# Patient Record
Sex: Female | Born: 1985 | Hispanic: Yes | Marital: Single | Smoking: Never smoker
Health system: Southern US, Community
[De-identification: ages and names within clinical notes are randomized; demographics above are authoritative.]

## PROBLEM LIST (undated history)

## (undated) DIAGNOSIS — Z789 Other specified health status: Secondary | ICD-10-CM

## (undated) DIAGNOSIS — O24419 Gestational diabetes mellitus in pregnancy, unspecified control: Secondary | ICD-10-CM

## (undated) DIAGNOSIS — Z87898 Personal history of other specified conditions: Secondary | ICD-10-CM

## (undated) HISTORY — DX: Gestational diabetes mellitus in pregnancy, unspecified control: O24.419

## (undated) HISTORY — PX: NO PAST SURGERIES: SHX2092

## (undated) HISTORY — DX: Personal history of other specified conditions: Z87.898

---

## 2008-02-20 ENCOUNTER — Inpatient Hospital Stay (HOSPITAL_COMMUNITY): Admission: AD | Admit: 2008-02-20 | Discharge: 2008-02-23 | Payer: Self-pay | Admitting: Obstetrics

## 2010-01-09 NOTE — L&D Delivery Note (Signed)
Delivery Note At 6:23 AM a viable female was delivered via Vaginal, Spontaneous Delivery (Presentation: ; Occiput Anterior).  APGAR: 9, 9; weight 8 lb 5.2 oz (3776 g).   Placenta status: Intact, Manual removal.  Cord: 3 vessels with the following complications: None.  Cord pH: not done  Anesthesia: Epidural  Episiotomy: None Lacerations: None Suture Repair: 2.0 Est. Blood Loss (mL):   Mom to postpartum.  Baby to nursery-stable.  Melodi Happel A 09/24/2010, 6:42 AM

## 2010-04-26 LAB — CBC
HCT: 27.6 % — ABNORMAL LOW (ref 36.0–46.0)
Hemoglobin: 9.6 g/dL — ABNORMAL LOW (ref 12.0–15.0)
Platelets: 220 10*3/uL (ref 150–400)
RBC: 3.1 MIL/uL — ABNORMAL LOW (ref 3.87–5.11)
RBC: 3.91 MIL/uL (ref 3.87–5.11)
RDW: 15 % (ref 11.5–15.5)
WBC: 10.2 10*3/uL (ref 4.0–10.5)
WBC: 8 10*3/uL (ref 4.0–10.5)

## 2010-04-26 LAB — RPR: RPR Ser Ql: NONREACTIVE

## 2010-05-27 NOTE — Discharge Summary (Signed)
NAME:  Laura Brady, Laura Brady NO.:  [id number]   MEDICAL RECORD NO.:  [id number]          PATIENT TYPE:  INP   LOCATION:  9128                          FACILITY:  [facility]   PHYSICIAN:  Charles A. Clearance Coots, M.D.DATE OF BIRTH:  08-15-1985   DATE OF ADMISSION:  02/20/2008  DATE OF DISCHARGE:  02/23/2008                               DISCHARGE SUMMARY   ADMITTING DIAGNOSIS:  Postdates pregnancy, induction of labor.   DISCHARGE DIAGNOSES:  Postdates pregnancy, induction of labor status  post normal spontaneous vaginal delivery.  On February 21, 2008, viable  female was delivered at 0530, Apgars of 8 at one minute and 9 at five  minutes, weight of 6 pounds and 12 ounces.  Mother and infant discharged  home in good condition.   REASON FOR ADMISSION:  A 25 year old G1, estimated date of confinement,  February 16, 2008, presents for induction of labor for postdate.   PAST MEDICAL HISTORY:  Surgery, none.  Illnesses, none.   MEDICATIONS:  Prenatal vitamins.   ALLERGIES:  No known drug allergies.   SOCIAL HISTORY:  Single.  Negative tobacco, alcohol, or recreational  drug use.   FAMILY HISTORY:  Remarkable for diabetes.   PHYSICAL EXAMINATION:  GENERAL:  Well-nourished, well-developed female  in no acute distress, afebrile.  VITAL SIGNS:  Stable.  Lungs:  Clear to auscultation bilaterally.  HEART:  Regular rate and rhythm.  ABDOMEN:  Gravid, nontender.  Cervix 3 cm dilated, 80% effaced, vertex  at -2 station.   ADMITTING LABORATORY:  Hemoglobin 11.7, hematocrit 34.9, white blood  cell count 10,200, platelets 220,000.  RPR was nonreactive.   HOSPITAL COURSE:  The patient was admitted and started on Pitocin,  induction of labor and responded well to Pitocin and progressed to  normal spontaneous vaginal delivery on February 21, 2008.  There were no  intrapartum complications.  Postpartum course was uncomplicated and the  patient was discharged home on postpartum day #2 in good  condition.   DISCHARGE LABORATORY:  Hemoglobin 9.6, hematocrit 27.6, white blood cell  count 8000, platelets 170,000.   DISCHARGE DISPOSITION:  Continue prenatal vitamins.  Ibuprofen was  prescribed for pain.  Routine written instructions were given for  discharge after vaginal delivery.  The patient is to call office for  followup appointment in 6 weeks.      Charles A. Clearance Coots, M.D.  Electronically Signed     CAH/MEDQ  D:  03/13/2008  T:  03/14/2008  Job:  [id number]

## 2010-09-24 ENCOUNTER — Encounter (HOSPITAL_COMMUNITY): Payer: Self-pay | Admitting: Anesthesiology

## 2010-09-24 ENCOUNTER — Inpatient Hospital Stay (HOSPITAL_COMMUNITY)
Admission: AD | Admit: 2010-09-24 | Discharge: 2010-09-26 | DRG: 774 | Disposition: A | Discharge status: Home / Self Care | Payer: Medicaid Other | Source: Ambulatory Visit | Attending: Obstetrics | Admitting: Obstetrics

## 2010-09-24 ENCOUNTER — Encounter (HOSPITAL_COMMUNITY): Payer: Self-pay | Admitting: *Deleted

## 2010-09-24 ENCOUNTER — Inpatient Hospital Stay (HOSPITAL_COMMUNITY): Payer: Medicaid Other | Admitting: Anesthesiology

## 2010-09-24 LAB — CBC
HCT: 35.1 % — ABNORMAL LOW (ref 36.0–46.0)
MCH: 28.7 pg (ref 26.0–34.0)
MCHC: 33.3 g/dL (ref 30.0–36.0)
MCV: 86.2 fL (ref 78.0–100.0)
Platelets: 203 10*3/uL (ref 150–400)
RDW: 14.1 % (ref 11.5–15.5)

## 2010-09-24 LAB — ABO/RH: RH Type: POSITIVE

## 2010-09-24 LAB — RPR: RPR: NONREACTIVE

## 2010-09-24 LAB — HIV ANTIBODY (ROUTINE TESTING W REFLEX): HIV: NONREACTIVE

## 2010-09-24 LAB — HEPATITIS B SURFACE ANTIGEN: Hepatitis B Surface Ag: NEGATIVE

## 2010-09-24 LAB — RUBELLA ANTIBODY, IGM: Rubella: IMMUNE

## 2010-09-24 MED ORDER — BENZOCAINE-MENTHOL 20-0.5 % EX AERO
INHALATION_SPRAY | CUTANEOUS | Status: AC
  Filled 2010-09-24: qty 56

## 2010-09-24 MED ORDER — CITRIC ACID-SODIUM CITRATE 334-500 MG/5ML PO SOLN: 30.0000 mL | ORAL | Status: DC | PRN

## 2010-09-24 MED ORDER — ACETAMINOPHEN 325 MG PO TABS: 650.0000 mg | ORAL_TABLET | ORAL | Status: DC | PRN

## 2010-09-24 MED ORDER — OXYCODONE-ACETAMINOPHEN 5-325 MG PO TABS: 2.0000 | ORAL_TABLET | ORAL | Status: DC | PRN

## 2010-09-24 MED ORDER — LACTATED RINGERS IV SOLN
INTRAVENOUS | Status: DC
  Administered 2010-09-24 (×2): 125 mL/h via INTRAVENOUS

## 2010-09-24 MED ORDER — ZOLPIDEM TARTRATE 5 MG PO TABS: 5.0000 mg | ORAL_TABLET | Freq: Every evening | ORAL | Status: DC | PRN

## 2010-09-24 MED ORDER — PRENATAL PLUS 27-1 MG PO TABS
1.0000 | ORAL_TABLET | Freq: Every day | ORAL | Status: DC
  Administered 2010-09-24 – 2010-09-26 (×3): 1 via ORAL
  Filled 2010-09-24 (×3): qty 1

## 2010-09-24 MED ORDER — PENICILLIN G POTASSIUM 5000000 UNITS IJ SOLR
2.5000 10*6.[IU] | INTRAVENOUS | Status: DC
  Filled 2010-09-24 (×3): qty 2.5

## 2010-09-24 MED ORDER — FERROUS SULFATE 325 (65 FE) MG PO TABS
325.0000 mg | ORAL_TABLET | Freq: Two times a day (BID) | ORAL | Status: DC
  Administered 2010-09-24 – 2010-09-26 (×4): 325 mg via ORAL
  Filled 2010-09-24 (×4): qty 1

## 2010-09-24 MED ORDER — ONDANSETRON HCL 4 MG/2ML IJ SOLN: 4.0000 mg | INTRAMUSCULAR | Status: DC | PRN

## 2010-09-24 MED ORDER — FENTANYL 2.5 MCG/ML BUPIVACAINE 1/10 % EPIDURAL INFUSION (WH - ANES)
14.0000 mL/h | INTRAMUSCULAR | Status: DC
  Filled 2010-09-24: qty 60

## 2010-09-24 MED ORDER — ONDANSETRON HCL 4 MG PO TABS: 4.0000 mg | ORAL_TABLET | ORAL | Status: DC | PRN

## 2010-09-24 MED ORDER — PHENYLEPHRINE 40 MCG/ML (10ML) SYRINGE FOR IV PUSH (FOR BLOOD PRESSURE SUPPORT)
80.0000 ug | PREFILLED_SYRINGE | INTRAVENOUS | Status: DC | PRN
  Filled 2010-09-24: qty 5

## 2010-09-24 MED ORDER — LIDOCAINE HCL 1.5 % IJ SOLN
INTRAMUSCULAR | Status: DC | PRN
  Administered 2010-09-24 (×2): 4 mL via EPIDURAL

## 2010-09-24 MED ORDER — LACTATED RINGERS IV SOLN
500.0000 mL | Freq: Once | INTRAVENOUS | Status: AC
  Administered 2010-09-24: 600 mL via INTRAVENOUS

## 2010-09-24 MED ORDER — ONDANSETRON HCL 4 MG/2ML IJ SOLN: 4.0000 mg | Freq: Four times a day (QID) | INTRAMUSCULAR | Status: DC | PRN

## 2010-09-24 MED ORDER — OXYTOCIN BOLUS FROM INFUSION
500.0000 mL | Freq: Once | INTRAVENOUS | Status: DC
  Filled 2010-09-24: qty 1000
  Filled 2010-09-24: qty 500

## 2010-09-24 MED ORDER — EPHEDRINE 5 MG/ML INJ
10.0000 mg | INTRAVENOUS | Status: DC | PRN
  Filled 2010-09-24 (×2): qty 4

## 2010-09-24 MED ORDER — SIMETHICONE 80 MG PO CHEW: 80.0000 mg | CHEWABLE_TABLET | ORAL | Status: DC | PRN

## 2010-09-24 MED ORDER — IBUPROFEN 600 MG PO TABS
600.0000 mg | ORAL_TABLET | Freq: Four times a day (QID) | ORAL | Status: DC | PRN
  Filled 2010-09-24 (×7): qty 1

## 2010-09-24 MED ORDER — BENZOCAINE-MENTHOL 20-0.5 % EX AERO
INHALATION_SPRAY | CUTANEOUS | Status: AC
  Administered 2010-09-24: 18:00:00
  Filled 2010-09-24: qty 56

## 2010-09-24 MED ORDER — LIDOCAINE HCL (PF) 1 % IJ SOLN
30.0000 mL | INTRAMUSCULAR | Status: DC | PRN
  Filled 2010-09-24 (×2): qty 30

## 2010-09-24 MED ORDER — BUTORPHANOL TARTRATE 2 MG/ML IJ SOLN: 2.0000 mg | INTRAMUSCULAR | Status: DC | PRN

## 2010-09-24 MED ORDER — PHENYLEPHRINE 40 MCG/ML (10ML) SYRINGE FOR IV PUSH (FOR BLOOD PRESSURE SUPPORT)
80.0000 ug | PREFILLED_SYRINGE | INTRAVENOUS | Status: DC | PRN
  Filled 2010-09-24 (×2): qty 5

## 2010-09-24 MED ORDER — LACTATED RINGERS IV SOLN
500.0000 mL | INTRAVENOUS | Status: DC | PRN
Start: 2010-09-24 — End: 2010-09-24

## 2010-09-24 MED ORDER — OXYTOCIN 20 UNITS IN LACTATED RINGERS INFUSION - SIMPLE: 1.0000 m[IU]/min | INTRAVENOUS | Status: DC

## 2010-09-24 MED ORDER — EPHEDRINE 5 MG/ML INJ
10.0000 mg | INTRAVENOUS | Status: DC | PRN
  Filled 2010-09-24: qty 4

## 2010-09-24 MED ORDER — DIPHENHYDRAMINE HCL 50 MG/ML IJ SOLN: 12.5000 mg | INTRAMUSCULAR | Status: DC | PRN

## 2010-09-24 MED ORDER — PROMETHAZINE HCL 25 MG/ML IJ SOLN: 12.5000 mg | Freq: Four times a day (QID) | INTRAMUSCULAR | Status: DC | PRN

## 2010-09-24 MED ORDER — WITCH HAZEL-GLYCERIN EX PADS: 1.0000 "application " | MEDICATED_PAD | CUTANEOUS | Status: DC | PRN

## 2010-09-24 MED ORDER — SENNOSIDES-DOCUSATE SODIUM 8.6-50 MG PO TABS
2.0000 | ORAL_TABLET | Freq: Every day | ORAL | Status: DC
  Administered 2010-09-24: 2 via ORAL

## 2010-09-24 MED ORDER — LANOLIN HYDROUS EX OINT: TOPICAL_OINTMENT | CUTANEOUS | Status: DC | PRN

## 2010-09-24 MED ORDER — PENICILLIN G POTASSIUM 5000000 UNITS IJ SOLR
5.0000 10*6.[IU] | Freq: Once | INTRAVENOUS | Status: AC
  Administered 2010-09-24: 5 10*6.[IU] via INTRAVENOUS
  Filled 2010-09-24: qty 5

## 2010-09-24 MED ORDER — TERBUTALINE SULFATE 1 MG/ML IJ SOLN: 0.2500 mg | Freq: Once | INTRAMUSCULAR | Status: DC | PRN

## 2010-09-24 MED ORDER — TETANUS-DIPHTH-ACELL PERTUSSIS 5-2.5-18.5 LF-MCG/0.5 IM SUSP
0.5000 mL | Freq: Once | INTRAMUSCULAR | Status: AC
  Administered 2010-09-26: 0.5 mL via INTRAMUSCULAR
  Filled 2010-09-24: qty 0.5

## 2010-09-24 MED ORDER — DIPHENHYDRAMINE HCL 25 MG PO CAPS: 25.0000 mg | ORAL_CAPSULE | Freq: Four times a day (QID) | ORAL | Status: DC | PRN

## 2010-09-24 MED ORDER — IBUPROFEN 600 MG PO TABS
600.0000 mg | ORAL_TABLET | Freq: Four times a day (QID) | ORAL | Status: DC
  Administered 2010-09-24 – 2010-09-26 (×8): 600 mg via ORAL
  Filled 2010-09-24: qty 1

## 2010-09-24 MED ORDER — DIBUCAINE 1 % RE OINT
1.0000 | TOPICAL_OINTMENT | RECTAL | Status: DC | PRN
Start: 2010-09-24 — End: 2010-09-26

## 2010-09-24 MED ORDER — FLEET ENEMA 7-19 GM/118ML RE ENEM: 1.0000 | ENEMA | RECTAL | Status: DC | PRN

## 2010-09-24 MED ORDER — FENTANYL 2.5 MCG/ML BUPIVACAINE 1/10 % EPIDURAL INFUSION (WH - ANES)
INTRAMUSCULAR | Status: DC | PRN
  Administered 2010-09-24: 14 mL/h via EPIDURAL

## 2010-09-24 MED ORDER — BENZOCAINE-MENTHOL 20-0.5 % EX AERO: 1.0000 "application " | INHALATION_SPRAY | CUTANEOUS | Status: DC | PRN

## 2010-09-24 MED ORDER — OXYTOCIN 20 UNITS IN LACTATED RINGERS INFUSION - SIMPLE
125.0000 mL/h | Freq: Once | INTRAVENOUS | Status: AC
  Administered 2010-09-24: 125 mL/h via INTRAVENOUS

## 2010-09-24 MED ORDER — OXYCODONE-ACETAMINOPHEN 5-325 MG PO TABS: 1.0000 | ORAL_TABLET | ORAL | Status: DC | PRN

## 2010-09-24 NOTE — H&P (Signed)
This is Dr. Francoise Ceo dictating the history and physical on Laura Brady She's a 25 year old gravida 2 para 1001 EDC 10-12 Her membranes ruptured artificially correction spontaneously at 12:30 AM On admission she was 4 cm she is now fully dilated and plus one station Her GBS was unknown and she was treated with penicillin Past medical history negative Past surgical history negative  social history negative System review negative Physical exam HEENT negative Breasts negative Heart regular with no murmurs no gallops Abdomen term Pelvic as described above Extremities negative and

## 2010-09-24 NOTE — Progress Notes (Signed)
Pt states, " I have had contractions off and on all day, and at 0015 my water broke. "

## 2010-09-24 NOTE — Anesthesia Procedure Notes (Signed)
Epidural Patient location during procedure: OB Start time: 09/24/2010 4:05 AM  Staffing Anesthesiologist: Malen Gauze, Jaser Fullen A. Performed by: anesthesiologist   Preanesthetic Checklist Completed: patient identified, site marked, surgical consent, pre-op evaluation, timeout performed, IV checked, risks and benefits discussed and monitors and equipment checked  Epidural Patient position: sitting Prep: site prepped and draped and DuraPrep Patient monitoring: continuous pulse ox and blood pressure Approach: midline Injection technique: LOR air  Needle:  Needle type: Tuohy  Needle gauge: 17 G Needle length: 9 cm Needle insertion depth: 5 cm cm Catheter type: closed end flexible Catheter size: 19 Gauge Catheter at skin depth: 10 cm Test dose: negative and 1.5% lidocaine  Assessment Events: blood not aspirated, injection not painful, no injection resistance, negative IV test and no paresthesia  Additional Notes Patient is more comfortable after epidural dosed. Please see RN's note for documentation of vital signs and FHR which are stable.

## 2010-09-24 NOTE — Anesthesia Postprocedure Evaluation (Signed)
  Anesthesia Post-op Note  Patient: Laura Brady  Procedure(s) Performed: * No procedures listed *  Patient Location: PACU and Mother/Baby  Anesthesia Type: Epidural  Level of Consciousness: awake, alert  and oriented  Airway and Oxygen Therapy: Patient Spontanous Breathing   Post-op Assessment: Patient's Cardiovascular Status Stable and Respiratory Function Stable  Post-op Vital Signs: stable  Complications: No apparent anesthesia complications

## 2010-09-24 NOTE — Anesthesia Preprocedure Evaluation (Signed)
Anesthesia Evaluation  Name, MR# and DOB Patient awake  General Assessment Comment  Reviewed: Allergy & Precautions, H&P , Patient's Chart, lab work & pertinent test results  Airway Mallampati: III TM Distance: >3 FB Neck ROM: full    Dental No notable dental hx. (+) Teeth Intact   Pulmonary  clear to auscultation  pulmonary exam normalPulmonary Exam Normal breath sounds clear to auscultation none    Cardiovascular regular Normal    Neuro/Psych Negative Neurological ROS  Negative Psych ROS  GI/Hepatic/Renal negative GI ROS  negative Liver ROS  negative Renal ROS        Endo/Other  Negative Endocrine ROS (+)      Abdominal   Musculoskeletal   Hematology negative hematology ROS (+)   Peds  Reproductive/Obstetrics (+) Pregnancy    Anesthesia Other Findings             Anesthesia Physical Anesthesia Plan  ASA: II  Anesthesia Plan: Epidural   Post-op Pain Management:    Induction:   Airway Management Planned:   Additional Equipment:   Intra-op Plan:   Post-operative Plan:   Informed Consent: I have reviewed the patients History and Physical, chart, labs and discussed the procedure including the risks, benefits and alternatives for the proposed anesthesia with the patient or authorized representative who has indicated his/her understanding and acceptance.     Plan Discussed with: Anesthesiologist  Anesthesia Plan Comments:         Anesthesia Quick Evaluation  

## 2010-09-24 NOTE — Plan of Care (Signed)
Problem: Consults Goal: Birthing Suites Patient Information Press F2 to bring up selections list   Pt 37-[redacted] weeks EGA, SROM at home.

## 2010-09-25 LAB — CBC
MCH: 28.7 pg (ref 26.0–34.0)
MCHC: 33.1 g/dL (ref 30.0–36.0)
Platelets: 162 10*3/uL (ref 150–400)

## 2010-09-25 NOTE — Progress Notes (Signed)
Postpartum day #1 Vital signs normal Fundus firm Lochia moderate Legs negative No complaints   Postpartum day #1 Vital signs normal Fundus firm Lochia moderate Legs negative No complaints

## 2010-09-26 MED ORDER — NORETHINDRONE 0.35 MG PO TABS: 1.0000 | ORAL_TABLET | Freq: Every day | ORAL | Status: DC

## 2010-09-26 MED ORDER — OXYCODONE-ACETAMINOPHEN 5-325 MG PO TABS: 2.0000 | ORAL_TABLET | Freq: Four times a day (QID) | ORAL | Status: AC | PRN

## 2010-09-26 NOTE — Progress Notes (Signed)
UR chart review completed.  

## 2010-09-26 NOTE — Discharge Summary (Signed)
  Obstetric Discharge Summary Reason for Admission: onset of labor Prenatal Procedures: none Intrapartum Procedures: spontaneous vaginal delivery Postpartum Procedures: none Complications-Operative and Postpartum: none  Hemoglobin  Date Value Range Status  09/25/2010 10.1* 12.0-15.0 (g/dL) Final     HCT  Date Value Range Status  09/25/2010 30.5* 36.0-46.0 (%) Final    Discharge Diagnoses: Term Pregnancy-delivered  Discharge Information: Date: 09/26/2010 Activity: pelvic rest Diet: routine Medications: Ibuprophen Condition: stable Instructions: refer to routine discharge instructions Discharge to: home Follow-up Information    Follow up with Antionette Char A, MD. Call in 6 weeks.   Contact information:   [address] [place] [address] [phone]          Newborn Data: Live born  Information for the patient's newborn:  Mistie, Adney [[id number]]  female ; APGAR , ; weight ;  Home with mother.  JACKSON-MOORE,Iain Sawchuk A 09/26/2010, 8:14 AM

## 2010-09-26 NOTE — Progress Notes (Signed)
Post Partum Day #2 S/P:spontaneous vaginal  RH status/Rubella reviewed.  Feeding: breast Subjective: No HA, SOB, CP, F/C, breast symptoms: No. Normal vaginal bleeding, no clots.     Objective:  Blood pressure 93/59, pulse 64, temperature 98.1 F (36.7 C), temperature source Oral, resp. rate 16, height 5\' 3"  (1.6 m), weight 82.555 kg (182 lb), last menstrual period 01/04/2010, SpO2 99.00%, unknown if currently breastfeeding. I  Physical Exam:  General: alert Lochia: appropriate Uterine Fundus: firm DVT Evaluation: No evidence of DVT seen on physical exam. Ext: No c/c/e  Basename 09/25/10 0515 09/24/10 0110  HGB 10.1* 11.7*  HCT 30.5* 35.1*    Assessment/Plan: 25 y.o.  PPD # 2 .  normal postpartum exam patient is a candidate for oral progesterone-only contraceptive for contraception, with no contraindications Continue current postpartum care D/C home   LOS: 2 days   JACKSON-MOORE,Betsaida [place] A 09/26/2010, 8:03 AM

## 2013-01-09 LAB — OB RESULTS CONSOLE RPR: RPR: NONREACTIVE

## 2013-01-09 LAB — OB RESULTS CONSOLE RUBELLA ANTIBODY, IGM: RUBELLA: IMMUNE

## 2013-01-09 LAB — OB RESULTS CONSOLE GC/CHLAMYDIA
Chlamydia: NEGATIVE
GC PROBE AMP, GENITAL: NEGATIVE

## 2013-01-09 LAB — OB RESULTS CONSOLE ANTIBODY SCREEN: Antibody Screen: NEGATIVE

## 2013-01-09 LAB — OB RESULTS CONSOLE HIV ANTIBODY (ROUTINE TESTING): HIV: NONREACTIVE

## 2013-01-09 LAB — OB RESULTS CONSOLE ABO/RH: RH TYPE: POSITIVE

## 2013-01-09 LAB — OB RESULTS CONSOLE HEPATITIS B SURFACE ANTIGEN: Hepatitis B Surface Ag: NEGATIVE

## 2013-01-09 NOTE — L&D Delivery Note (Signed)
Delivery Note At 4:48 PM a viable female was delivered via  (Presentation:oa,with nuchal cord and occult arm which was reduced without  difficulty ;  ).  APGAR:9 ,9 ; weight .   Placenta status:spont , . 3vc  Cord:  with the following complications:none .  Cord pH: n/a  Anesthesia:  none Episiotomy: none Lacerations: none Suture Repair: n/a Est. Blood Loss (mL):   Mom to postpartum.  Baby to Couplet care / Skin to Skin.  Laura Brady, Laura Brady 08/24/2013, 4:57 PM

## 2013-02-24 ENCOUNTER — Other Ambulatory Visit (HOSPITAL_COMMUNITY): Payer: Self-pay | Admitting: Physician Assistant

## 2013-02-24 DIAGNOSIS — Z3689 Encounter for other specified antenatal screening: Secondary | ICD-10-CM

## 2013-02-24 LAB — OB RESULTS CONSOLE ABO/RH: RH Type: POSITIVE

## 2013-02-24 LAB — OB RESULTS CONSOLE RPR: RPR: NONREACTIVE

## 2013-02-24 LAB — OB RESULTS CONSOLE RUBELLA ANTIBODY, IGM: RUBELLA: IMMUNE

## 2013-02-24 LAB — OB RESULTS CONSOLE HIV ANTIBODY (ROUTINE TESTING): HIV: NONREACTIVE

## 2013-02-24 LAB — OB RESULTS CONSOLE HEPATITIS B SURFACE ANTIGEN: Hepatitis B Surface Ag: NEGATIVE

## 2013-02-24 LAB — OB RESULTS CONSOLE ANTIBODY SCREEN: Antibody Screen: NEGATIVE

## 2013-02-24 LAB — OB RESULTS CONSOLE GC/CHLAMYDIA
CHLAMYDIA, DNA PROBE: NEGATIVE
Gonorrhea: NEGATIVE

## 2013-04-14 ENCOUNTER — Ambulatory Visit (HOSPITAL_COMMUNITY)
Admission: RE | Admit: 2013-04-14 | Discharge: 2013-04-14 | Disposition: A | Discharge status: Home / Self Care | Payer: Medicaid Other | Source: Ambulatory Visit | Attending: Physician Assistant | Admitting: Physician Assistant

## 2013-04-14 DIAGNOSIS — Z3689 Encounter for other specified antenatal screening: Secondary | ICD-10-CM | POA: Insufficient documentation

## 2013-06-09 LAB — OB RESULTS CONSOLE GBS: GBS: NEGATIVE

## 2013-07-04 ENCOUNTER — Other Ambulatory Visit: Payer: Self-pay | Admitting: Family Medicine

## 2013-07-04 DIAGNOSIS — N63 Unspecified lump in unspecified breast: Secondary | ICD-10-CM

## 2013-07-09 ENCOUNTER — Other Ambulatory Visit: Payer: Medicaid Other

## 2013-07-10 ENCOUNTER — Ambulatory Visit
Admission: RE | Admit: 2013-07-10 | Discharge: 2013-07-10 | Disposition: A | Discharge status: Home / Self Care | Payer: Medicaid Other | Source: Ambulatory Visit | Attending: Family Medicine | Admitting: Family Medicine

## 2013-07-10 DIAGNOSIS — N63 Unspecified lump in unspecified breast: Secondary | ICD-10-CM

## 2013-08-07 LAB — OB RESULTS CONSOLE GBS: STREP GROUP B AG: NEGATIVE

## 2013-08-24 ENCOUNTER — Encounter (HOSPITAL_COMMUNITY): Payer: Self-pay | Admitting: *Deleted

## 2013-08-24 ENCOUNTER — Inpatient Hospital Stay (HOSPITAL_COMMUNITY)
Admission: AD | Admit: 2013-08-24 | Discharge: 2013-08-26 | DRG: 775 | Disposition: A | Discharge status: Home / Self Care | Payer: Medicaid Other | Source: Ambulatory Visit | Attending: Obstetrics & Gynecology | Admitting: Obstetrics & Gynecology

## 2013-08-24 DIAGNOSIS — O479 False labor, unspecified: Secondary | ICD-10-CM | POA: Diagnosis present

## 2013-08-24 LAB — TYPE AND SCREEN
ABO/RH(D): A POS
Antibody Screen: NEGATIVE

## 2013-08-24 LAB — CBC
HEMATOCRIT: 37.3 % (ref 36.0–46.0)
HEMOGLOBIN: 12.4 g/dL (ref 12.0–15.0)
MCH: 29.8 pg (ref 26.0–34.0)
MCHC: 33.2 g/dL (ref 30.0–36.0)
MCV: 89.7 fL (ref 78.0–100.0)
Platelets: 158 10*3/uL (ref 150–400)
RBC: 4.16 MIL/uL (ref 3.87–5.11)
RDW: 14.9 % (ref 11.5–15.5)
WBC: 8.3 10*3/uL (ref 4.0–10.5)

## 2013-08-24 LAB — ABO/RH: ABO/RH(D): A POS

## 2013-08-24 MED ORDER — DIPHENHYDRAMINE HCL 50 MG/ML IJ SOLN: 12.5000 mg | INTRAMUSCULAR | Status: DC | PRN

## 2013-08-24 MED ORDER — LIDOCAINE HCL (PF) 1 % IJ SOLN
INTRAMUSCULAR | Status: AC
  Filled 2013-08-24: qty 30

## 2013-08-24 MED ORDER — PRENATAL MULTIVITAMIN CH
1.0000 | ORAL_TABLET | Freq: Every day | ORAL | Status: DC
  Administered 2013-08-25 – 2013-08-26 (×2): 1 via ORAL
  Filled 2013-08-24 (×2): qty 1

## 2013-08-24 MED ORDER — LACTATED RINGERS IV SOLN: 500.0000 mL | Freq: Once | INTRAVENOUS | Status: DC

## 2013-08-24 MED ORDER — WITCH HAZEL-GLYCERIN EX PADS: 1.0000 "application " | MEDICATED_PAD | CUTANEOUS | Status: DC | PRN

## 2013-08-24 MED ORDER — DIPHENHYDRAMINE HCL 25 MG PO CAPS
25.0000 mg | ORAL_CAPSULE | Freq: Four times a day (QID) | ORAL | Status: DC | PRN
Start: 2013-08-24 — End: 2013-08-26

## 2013-08-24 MED ORDER — ONDANSETRON HCL 4 MG PO TABS: 4.0000 mg | ORAL_TABLET | ORAL | Status: DC | PRN

## 2013-08-24 MED ORDER — EPHEDRINE 5 MG/ML INJ: 10.0000 mg | INTRAVENOUS | Status: DC | PRN

## 2013-08-24 MED ORDER — OXYTOCIN 40 UNITS IN LACTATED RINGERS INFUSION - SIMPLE MED
INTRAVENOUS | Status: AC
  Administered 2013-08-24: 40 [IU]
  Filled 2013-08-24: qty 1000

## 2013-08-24 MED ORDER — ONDANSETRON HCL 4 MG/2ML IJ SOLN: 4.0000 mg | INTRAMUSCULAR | Status: DC | PRN

## 2013-08-24 MED ORDER — TETANUS-DIPHTH-ACELL PERTUSSIS 5-2.5-18.5 LF-MCG/0.5 IM SUSP: 0.5000 mL | Freq: Once | INTRAMUSCULAR | Status: DC

## 2013-08-24 MED ORDER — SIMETHICONE 80 MG PO CHEW: 80.0000 mg | CHEWABLE_TABLET | ORAL | Status: DC | PRN

## 2013-08-24 MED ORDER — OXYTOCIN 40 UNITS IN LACTATED RINGERS INFUSION - SIMPLE MED: 62.5000 mL/h | INTRAVENOUS | Status: DC | PRN

## 2013-08-24 MED ORDER — ZOLPIDEM TARTRATE 5 MG PO TABS: 5.0000 mg | ORAL_TABLET | Freq: Every evening | ORAL | Status: DC | PRN

## 2013-08-24 MED ORDER — SENNOSIDES-DOCUSATE SODIUM 8.6-50 MG PO TABS
2.0000 | ORAL_TABLET | ORAL | Status: DC
  Administered 2013-08-25 – 2013-08-26 (×2): 2 via ORAL
  Filled 2013-08-24: qty 1
  Filled 2013-08-24 (×2): qty 2

## 2013-08-24 MED ORDER — SODIUM CHLORIDE 0.9 % IJ SOLN: 3.0000 mL | INTRAMUSCULAR | Status: DC | PRN

## 2013-08-24 MED ORDER — PHENYLEPHRINE 40 MCG/ML (10ML) SYRINGE FOR IV PUSH (FOR BLOOD PRESSURE SUPPORT): 80.0000 ug | PREFILLED_SYRINGE | INTRAVENOUS | Status: DC | PRN

## 2013-08-24 MED ORDER — SODIUM CHLORIDE 0.9 % IV SOLN: 250.0000 mL | INTRAVENOUS | Status: DC | PRN

## 2013-08-24 MED ORDER — BENZOCAINE-MENTHOL 20-0.5 % EX AERO
1.0000 "application " | INHALATION_SPRAY | CUTANEOUS | Status: DC | PRN
  Administered 2013-08-24: 1 via TOPICAL
  Filled 2013-08-24 (×2): qty 56

## 2013-08-24 MED ORDER — FENTANYL 2.5 MCG/ML BUPIVACAINE 1/10 % EPIDURAL INFUSION (WH - ANES): 14.0000 mL/h | INTRAMUSCULAR | Status: DC | PRN

## 2013-08-24 MED ORDER — LANOLIN HYDROUS EX OINT: TOPICAL_OINTMENT | CUTANEOUS | Status: DC | PRN

## 2013-08-24 MED ORDER — OXYCODONE-ACETAMINOPHEN 5-325 MG PO TABS
1.0000 | ORAL_TABLET | ORAL | Status: DC | PRN
  Administered 2013-08-25: 1 via ORAL
  Filled 2013-08-24: qty 1

## 2013-08-24 MED ORDER — IBUPROFEN 600 MG PO TABS
600.0000 mg | ORAL_TABLET | Freq: Four times a day (QID) | ORAL | Status: DC
  Administered 2013-08-24 – 2013-08-26 (×8): 600 mg via ORAL
  Filled 2013-08-24 (×8): qty 1

## 2013-08-24 MED ORDER — SODIUM CHLORIDE 0.9 % IJ SOLN: 3.0000 mL | Freq: Two times a day (BID) | INTRAMUSCULAR | Status: DC

## 2013-08-24 MED ORDER — DIBUCAINE 1 % RE OINT
1.0000 "application " | TOPICAL_OINTMENT | RECTAL | Status: DC | PRN
  Filled 2013-08-24: qty 28

## 2013-08-24 NOTE — H&P (Signed)
Dellie Burnsntonia [name] MARTINEZ is a 28 y.o. female presenting for labor. Maternal Medical History:  Reason for admission: Contractions.   Contractions: Onset was 3-5 hours ago.   Frequency: regular.   Perceived severity is moderate.    Fetal activity: Perceived fetal activity is normal.   Last perceived fetal movement was within the past hour.      OB History   Grav Para Term Preterm Abortions TAB SAB Ect Mult Living   3 2 2  0 0 0 0 0 0 2     History reviewed. No pertinent past medical history. History reviewed. No pertinent past surgical history. Family History: family history is not on file. Social History:  reports that she has never smoked. She has never used smokeless tobacco. She reports that she does not drink alcohol or use illicit drugs.   Prenatal Transfer Tool  Maternal Diabetes: no Genetic Screening: Normal Maternal Ultrasounds/Referrals: Normal Fetal Ultrasounds or other Referrals:  None Maternal Substance Abuse:  No Significant Maternal Medications:  None Significant Maternal Lab Results:  None Other Comments:  None  Review of Systems  Constitutional: Negative.   HENT: Negative.   Eyes: Negative.   Respiratory: Negative.   Cardiovascular: Negative.   Gastrointestinal: Positive for abdominal pain.  Genitourinary: Negative.   Musculoskeletal: Negative.   Skin: Negative.   Neurological: Negative.   Endo/Heme/Allergies: Negative.   Psychiatric/Behavioral: Negative.     Dilation: 4.5 Effacement (%): 80 Station: -2 Exam by:: A. Gagliardo, RN Blood pressure 105/63, pulse 68, temperature 98.1 F (36.7 C), temperature source Oral, resp. rate 16, height 5\' 3"  (1.6 m), weight 177 lb (80.287 kg), last menstrual period 11/25/2012, unknown if currently breastfeeding. Maternal Exam:  Uterine Assessment: Contraction strength is moderate.  Contraction frequency is regular.   Abdomen: Patient reports no abdominal tenderness. Fetal presentation: vertex  Introitus:  Normal vulva. Normal vagina.    Fetal Exam Fetal Monitor Review: Mode: ultrasound.   Baseline rate: 120.  Variability: moderate (6-25 bpm).   Pattern: accelerations present.    Fetal State Assessment: Category I - tracings are normal.     Physical Exam  Constitutional: She is oriented to person, place, and time. She appears well-developed and well-nourished.  HENT:  Head: Normocephalic.  Neck: Normal range of motion.  Cardiovascular: Normal rate, regular rhythm, normal heart sounds and intact distal pulses.   Respiratory: Effort normal and breath sounds normal.  GI: Soft. Bowel sounds are normal.  Genitourinary: Vagina normal and uterus normal.  Musculoskeletal: Normal range of motion.  Neurological: She is alert and oriented to person, place, and time. She has normal reflexes.  Skin: Skin is warm and dry.  Psychiatric: She has a normal mood and affect. Her behavior is normal. Judgment and thought content normal.    Prenatal labs: ABO, Rh:   Antibody:   Rubella:   RPR:    HBsAg:    HIV:    GBS:     Assessment/Plan: Active labor   Yasmyn Bellisario DARLENE 08/24/2013, 4:20 PM

## 2013-08-24 NOTE — Progress Notes (Signed)
Delivery of live viable female by Marlynn Perking. Lawson, CNM. APGARS 9, 9

## 2013-08-24 NOTE — MAU Note (Signed)
Pt presents to MAU with complaints of vaginal spotting since this morning at 10. States some abdominal cramping

## 2013-08-24 NOTE — Progress Notes (Signed)
Notified of pt in MAU for labor eval. Cervix 1, will recheck and send home if no change noted. EFM tracing reactive

## 2013-08-25 LAB — RPR

## 2013-08-25 NOTE — Progress Notes (Signed)
I assisted Maury DusDonna RN with Interpretation of care plan. [phone] Margie Brink H [employer]

## 2013-08-25 NOTE — Lactation Note (Signed)
This note was copied from the chart of Boy Dellie Burnsntonia [name] MARTINEZ. Lactation Consultation Note: Initial visit with this experienced BF mom. Dad translated for me. She had baby latched to breast when I went into room. Reports that baby has been latching well with no pain. Encouraged to feed whenever she sees feeding cues. No questions at present. Spanish BF brochure given with resources for support after DC. To call for assist prn  Patient Name: Boy Dellie Burnsntonia [name] MARTINEZ ZOXWR'UToday's Date: 08/25/2013 Reason for consult: Initial assessment   Maternal Data Formula Feeding for Exclusion: Yes Reason for exclusion: Mother's choice to formula and breast feed on admission Does the patient have breastfeeding experience prior to this delivery?: Yes  Feeding Feeding Type: Breast Fed  LATCH Score/Interventions Latch: Grasps breast easily, tongue down, lips flanged, rhythmical sucking.  Audible Swallowing: A few with stimulation  Type of Nipple: Everted at rest and after stimulation  Comfort (Breast/Nipple): Soft / non-tender     Hold (Positioning): No assistance needed to correctly position infant at breast.  LATCH Score: 9  Lactation Tools Discussed/Used     Consult Status Consult Status: PRN    Pamelia HoitWeeks, Nour Scalise D 08/25/2013, 1:37 PM

## 2013-08-25 NOTE — Progress Notes (Signed)
Ur chart review completed.  

## 2013-08-25 NOTE — Progress Notes (Signed)
Post Partum Day 1 Subjective: no complaints, up ad lib, voiding, tolerating PO and + flatus  Objective: Blood pressure 98/64, pulse 69, temperature 99.1 F (37.3 C), temperature source Oral, resp. rate 20, height 5\' 3"  (1.6 m), weight 177 lb (80.287 kg), last menstrual period 11/25/2012, SpO2 98.00%, unknown if currently breastfeeding.  Physical Exam:  General: alert, cooperative, appears stated age and no distress Lochia: appropriate Uterine Fundus: firm Incision: n/a DVT Evaluation: No evidence of DVT seen on physical exam. Negative Homan's sign. Positive Homan's sign.   Recent Labs  08/24/13 1551  HGB 12.4  HCT 37.3    Assessment/Plan: Plan for discharge tomorrow   LOS: 1 day   Laura Brady 08/25/2013, 6:52 AM

## 2013-08-26 MED ORDER — IBUPROFEN 600 MG PO TABS: 600.0000 mg | ORAL_TABLET | Freq: Four times a day (QID) | ORAL | Status: DC

## 2013-08-26 NOTE — Lactation Note (Signed)
This note was copied from the chart of Laura Brady. Lactation Consultation Note  Patient Name: Laura Brady ONGEX'BToday's Date: 08/26/2013 Reason for consult: Follow-up assessment   Maternal Data  Baby 40 hours of life. Using [employer] [phone], [id number]. Mom latches baby first to right breast. Baby latches deeply, suckles rhythmically with intermittent swallows. Mom able to hand express lots of colostrum from both breast. Mom enc to compress breast as baby suckles. Baby has to be re-latched several times. Enc mom to unwrap baby and baby stays awake and suckles more consistently. Discussed engorgement prevention/treatment. Mom aware of OP/BFSG and LC phone line services. Referred mom to Baby and Me booklet for number of diapers to expect and EBM storage guidelines.   Feeding Feeding Type: Breast Fed (LC assessed first 20 minutes of breastfeed. ) Length of feed: 5 min  LATCH Score/Interventions Latch: Grasps breast easily, tongue down, lips flanged, rhythmical sucking.  Audible Swallowing: Spontaneous and intermittent Intervention(s): Hand expression  Type of Nipple: Everted at rest and after stimulation  Comfort (Breast/Nipple): Soft / non-tender     Hold (Positioning): No assistance needed to correctly position infant at breast.  LATCH Score: 10  Lactation Tools Discussed/Used     Consult Status Consult Status: Complete    Geralynn OchsWILLIARD, Celenia Hruska 08/26/2013, 9:37 AM

## 2013-08-26 NOTE — Discharge Instructions (Signed)
Parto vaginal, Cuidados posteriores  °(Vaginal Delivery, Care After) °Siga estas instrucciones durante las próximas semanas. Estas indicaciones para el alta le proporcionan información general acerca de cómo deberá cuidarse después del parto. El médico también podrá darle instrucciones específicas. El tratamiento ha sido planificado según las prácticas médicas actuales, pero en algunos casos pueden ocurrir problemas. Comuníquese con el médico si tiene algún problema o tiene preguntas al volver a su casa.  °INSTRUCCIONES PARA EL CUIDADO EN EL HOGAR  °· Tome sólo medicamentos de venta libre o recetados, según las indicaciones del médico o del farmacéutico. °· No beba alcohol, especialmente si está amamantando o toma analgésicos. °· No mastique tabaco ni fume. °· No consuma drogas. °· Continúe con un adecuado cuidado perineal. El buen cuidado perineal incluye: °¨ Higienizarse de adelante hacia atrás. °¨ Mantener la zona perineal limpia. °· No use tampones ni duchas vaginales hasta que su médico la autorice. °· Dúchese, lávese el cabello y tome baños de inmersión según las indicaciones de su médico. °· Utilice un sostén que le ajuste bien y que brinde buen soporte a sus mamas. °· Consuma alimentos saludables. °· Beba suficiente líquido para mantener la orina clara o de color amarillo pálido. °· Consuma alimentos ricos en fibra como cereales y panes integrales, arroz, frijoles y frutas y verduras frescas todos los días. Estos alimentos pueden ayudarla a prevenir o aliviar el estreñimiento. °· Siga las recomendaciones de su médico relacionadas con la reanudación de actividades como subir escaleras, conducir automóviles, levantar objetos, hacer ejercicios o viajar. °· Hable con su médico acerca de reanudar la actividad sexual. Volver a la actividad sexual depende del riesgo de infección, la velocidad de la curación y la comodidad y su deseo de reanudarla. °· Trate de que alguien la ayude con las actividades del hogar y con  el recién nacido al menos durante un par de días después de salir del hospital. °· Descanse todo lo que pueda. Trate de descansar o tomar una siesta mientras el bebé está durmiendo. °· Aumente sus actividades gradualmente. °· Cumpla con todas las visitas de control programadas para después del parto. Es muy importante asistir a todas las citas programadas de seguimiento. En estas citas, su médico va a controlarla para asegurarse de que esté sanando física y emocionalmente. °SOLICITE ATENCIÓN MÉDICA SI:  °· Elimina coágulos grandes por la vagina. Guarde algunos coágulos para mostrarle al médico. °· Tiene una secreción con feo olor que proviene de la vagina. °· Tiene dificultad para orinar. °· Orina con frecuencia. °· Siente dolor al orinar. °· Nota un cambio en sus movimientos intestinales. °· Aumenta el enrojecimiento, el dolor o la hinchazón en la zona de la incisión vaginal (episiotomía) o el desgarro vaginal. °· Tiene pus que drena por la episiotomía o el desgarro vaginal. °· La episiotomía o el desgarro vaginal se abren. °· Sus mamas le duelen, están duras o enrojecidas. °· Sufre un dolor intenso de cabeza. °· Tiene visión borrosa o ve manchas. °· Se siente triste o deprimida. °· Tiene pensamientos acerca de lastimarse o dañar al recién nacido. °· Tiene preguntas acerca de su cuidado personal, el cuidado del recién nacido o acerca de los medicamentos. °· Se siente mareada o sufre un desmayo. °· Tiene una erupción. °· Tiene náuseas o vómitos. °· Usted amamantó al bebé y no ha tenido su período menstrual dentro de las 12 semanas después de dejar de amamantar. °· No amamanta al bebé y no tuvo su período menstrual en las últimas 12° semanas después del   partoLance Muss. SOLICITE ATENCIN MDICA DE INMEDIATO SI:   Siente dolor persistente.  Siente dolor en el pecho.  Le falta el aire.  Se desmaya.  Siente dolor en la pierna.  Siente Physiological scientist.  El sangrado vaginal satura dos o ms  apsitos en 1 hora. ASEGRESE DE QUE:   Comprende estas instrucciones.  Controlar su enfermedad.  Recibir ayuda de inmediato si no mejora o si empeora. Document Released: 12/26/2004 Document Revised: 08/28/2012 [facility] Patient Information 2015 [place], [place]. This information is not intended to replace advice given to you by your health care provider. Make sure you discuss any questions you have with your health care provider. Desafos y soluciones para el amamantamiento ( Breastfeeding Challenges and Solutions) Aunque el amamantamiento es un proceso natural, puede presentar desafos, en especial durante las primeras semanas despus del nacimiento del beb. Al comenzar a amamantar al nuevo beb, es normal que surjan problemas, incluso si ha Hewlett-[employer]ard. En este documento, se indican algunas soluciones para los desafos ms frecuentes del amamantamiento.  DESAFOS Y SOLUCIONES Desafo: pezones irritados o agrietados Es frecuente que las madres que [address] tengan los pezones irritados o Chief Strategy Officer. Los pezones agrietados o irritados a menudo se deben a que la boca del beb se prende de forma inadecuada para Museum/gallery exhibitions officer. Tambin puede haber irritacin si el beb no est ubicado como corresponde [employer]. Si bien es frecuente DIRECTV pezones irritados y agrietados durante la primera semana posterior al nacimiento, el dolor en los pezones no es normal nunca. Si tiene los pezones irritados o agrietados durante ms de [place], o si tiene [employer] Stores, llame a su mdico o al Holiday representative.  Solucin Siga los siguientes pasos para asegurarse de que el beb se prenda y est ubicado como corresponde:  Encuentre un lugar cmodo para sentarse o Teacher, music, con un buen respaldo para el cuello y la espalda.  Coloque una almohada o una manta enrollada debajo del beb para acomodarlo a la altura de la mama (si est sentada).  Asegrese de que el abdomen del beb est frente al  suyo.  Masajee suavemente la mama. Con las yemas de los dedos, masajee la pared del pecho hacia el pezn en un movimiento circular. Esto estimula el flujo de [place]. Es posible que Engineer, manufacturing systems este movimiento mientras amamanta si la leche fluye lentamente.  Sostenga la mama con el pulgar por arriba del pezn y los otros 4 dedos por debajo de la mama. Asegrese de que los dedos se encuentren lejos del pezn y de la boca del beb.  Empuje suavemente los labios del beb con el pezn o con el dedo.  Cuando la boca del beb se abra lo suficiente, acrquelo rpidamente a la mama e introduzca todo el pezn y la zona oscura que lo rodea (areola), tanto como sea posible, dentro de la boca del beb.  Debe haber ms areola visible por arriba del labio superior del beb que por debajo del labio inferior.  La lengua del beb debe estar entre la enca inferior y la [place].  Asegrese de que la boca del beb est en la posicin correcta alrededor del pezn (prendida). Los labios del beb deben crear un sello sobre la mama, doblndose hacia afuera (invertidos).  Es comn que el beb succione durante 2 a 3 minutos para que comience el flujo de [place]. Entre los signos de que el beb se ha prendido adecuadamente al pezn, se incluyen:  El beb tironea o succiona con tranquilidad, sin causarle dolor.  Se escucha que traga cada 3 o 4 succiones.  Hace movimientos musculares por arriba y por delante de sus odos al Printmakersuccionar. Entre los signos de que el beb no se ha prendido Audiological scientistadecuadamente al pezn, se incluyen:   Ruidos de succin o de chasquido Patent attorneymientras amamanta.  Dolor en el pezn. Para asegurarse de Tesoro Corporationtener los pezones humectados y sanos, haga lo siguiente:  No se lave los pezones con [place]jabn.  Use un sostn de soporte. Evite usar sostenes con aro o sostenes ajustados.  Deje secar al aire los pezones durante 3 a 4minutos despus de amamantar al beb.  Utilice solamente almohadillas de algodn  en el sostn para absorber las prdidas de Arapahoeleche. La prdida de un poco de Deere & Companyleche materna entre las tomas es normal. Asegrese de Multimedia programmercambiar las almohadillas si se empapan con Kindredleche.  Colquese lanolina United Stationerssobre los pezones despus de Museum/gallery exhibitions officeramamantar. La lanolina ayuda a mantener la humedad normal de la piel. Si Botswanausa lanolina pura, no tiene que lavarse los pezones antes de Corporate treasureralimentar al beb. La lanolina pura no es txica para el beb. Tambin puede extraer Teachers Insurance and Annuity Associationmanualmente algunas gotas de The Meadowsleche materna, Floridamasajearla con suavidad sobre los pezones y dejar que se seque al aire. Desafo: congestin mamaria [place] Inccongestin mamaria se produce cuando las [address] D[address]amas se llenan excesivamente de Ohiopyleleche. Las primeras semanas despus del nacimiento, usted puede tener congestin Shoalsmamaria. La congestin International Business Machinesmamaria puede hacer que las mamas latan y estn duras, muy tirantes, calientes y sensibles. La congestin llega a un pico mximo alrededor del quinto da despus del nacimiento. El hecho de tener congestin mamaria no significa que deba dejar de Museum/gallery exhibitions officeramamantar al beb. Solucin  Alimente al beb cuando muestre signos de hambre o si siente la necesidad de reducir la congestin de las Tomemamas. Esto se denomina "lactancia a demanda".  Los recin nacidos (bebs de menos de 4semanas de vida) a menudo se alimentan cada 1 a 3 horas Administratordurante el da. Es posible que deba despertar al beb si est durmiendo cuando es hora de Money Islandamamantarlo.  No deje que el beb duerma ms de 5horas durante la noche sin amamantarlo.  Extraiga leche de forma manual o con un sacaleche antes de amamantar para ablandar la mama, la areola y el pezn.  Aplique calor hmedo (en la ducha o con toallas de mano humedecidas con agua tibia) justo antes de amamantar o de extraer Toa Bajaleche, o masajee la mama antes de amamantar o mientras lo hace. Esto aumenta la circulacin y Saint Vincent and the Grenadinesayuda a que la Rainierleche fluya.  Vace por completo las mamas al QUALCOMMamamantar o extraer Hollygroveleche. Despus, use un sostn tipo  deportivo (para amamantar o comn) o una camiseta sin mangas durante 1 o 2 das para indicarle al cuerpo que disminuya ligeramente la produccin de Morgantownleche. Use solamente un sostn tipo deportivo o una camiseta sin mangas para tratar la congestin. Las M.D.C. Holdingsmadres que amamantan habitualmente deben evitar los sostenes ajustados. Una vez que la congestin se Howealivie, vuelva a usar ropa Hamptoncomn, Ozarkfloja.  Aplquese compresas de hielo en las mamas para reducir Chief Technology Officerel dolor de la congestin y Technical sales engineeraliviar la hinchazn, excepto si el hielo le resulta incmodo.  No retrase los horarios de amamantamiento. Intente relajarse cuando sea la hora de alimentar al beb. Esto ayuda a Licensed conveyanceractivar el "reflejo de bajada", que hace que la Lake Davisleche comience a salir de Educational psychologistla mama.  Asegrese de que el beb est bien prendido a la mama y que se  encuentre en la posicin correcta mientras lo alimenta.  Deje que el beb permanezca en la mama todo el tiempo que est prendido bien y que succione activamente. El beb le har saber que ha terminado de alimentarse cuando se desprenda de la mama o se quede dormido.  Evite darle biberones o chupetes al beb en las primeras semanas de amamantamiento. Para introducir estos elementos, espere hasta despus de resolver cualquier desafo con el amamantamiento.  Si regresa al Aleen Campi o est fuera de su casa durante un perodo prolongado, trate de Marine scientist en el mismo horario que debera amamantar al beb.  Tome mucho lquido para Statistician, que con el tiempo puede aumentar el riesgo de [place]. Si sigue estas indicaciones, la congestin debe mejorar en 24 a 48 horas. Si an tiene dificultades, llame a su mdico o al Holiday representative.  Desafo: conductos galactforos obstruidos Los conductos galactforos se obstruyen cuando no drenan la leche como corresponde y, en consecuencia, se hinchan. El uso de un sostn para Museum/gallery exhibitions officer ajustado o las dificultades para que el beb se prenda  pueden causar la obstruccin de estos conductos. El hecho de no tomar la cantidad suficiente de agua (de 8 a 10vasos [1,9 a 2,4l] por Futures trader) puede contribuir a que los conductos galactforos se obstruyan. Una vez que un conducto se obstruye, puede tener bultos duros, irritacin y Copy.  Solucin No retrase los horarios de Librarian, academic. Alimente a su beb con frecuencia y trate de vaciar sus pechos cada vez que lo amamanta. Trate de alimentarlo primero del lado afectado ya que habr ms probabilidad de que se vace por completo ese pecho. Aplique toallas hmedas calientes durante 5 a antes de amamantar. De forma alternativa, una ducha caliente justo antes de Museum/gallery exhibitions officer puede ofrecer el calor hmedo que estimula el flujo de [place]. Tambin puede ser de [employer] suavemente la zona irritada antes de Museum/gallery exhibitions officer y [place] est [place]. Evite usar ropa [place] o sostenes que presionen las [place]. Use sostenes que sostengan bien las [place], pero evite usar sostenes con arco. Si se le obstruye un conducto galactfero y tiene [place], debe ver a su mdico.  Desafo: mastitis La mastitis es la inflamacin de la mama. Por lo general, est causada por una infeccin bacteriana y puede provocar sntomas similares a los de Emergency planning/management officer. Puede tener enrojecimiento de la mama y [place]. A menudo, cuando hay una mastitis, la mama est dura y caliente, y duele mucho. Las causas ms frecuentes de la mastitis son la mala prendida o la succin inadecuada del beb, presin constante en la mama (posiblemente por usar un sostn ajustado o una camisa que limite el flujo de [place]), estrs o fatiga anormales, o no Museum/gallery exhibitions officer con regularidad.  Solucin Le recetarn un antibitico para tratar la infeccin. Sigue siendo importante que amamante con frecuencia para vaciar las [employer]. Seguir Q[employer] Communications se recupera de la mastitis no le har dao al beb. Asegrese de que el beb est bien ubicado cada  vez que lo [place]. Aplique calor hmedo en las mamas durante unos minutos antes de amamantar para ayudar al flujo de la [place] y a que las mamas se vacen ms fcilmente. Desafo: candidiasis La candidiasis es una infeccin por hongos que se forma en los pezones, en la mama o en la boca del beb. Causa picazn, dolor, ardor o dolor punzante, y a veces una erupcin.  Solucin Le recetarn un ungento para los [place], y su beb recibir un medicamento  lquido para la boca. Es importante que usted y el beb reciban tratamiento de forma simultnea, porque pueden transmitirse la candidiasis entre ustedes. Cambie los apsitos mamarios desechables con frecuencia. Cada da, debe lavar con agua muy caliente cualquier sostn, toalla o ropa que entre en contacto con las reas infectadas de su cuerpo o del cuerpo del beb. Lvese las manos y lave las manos del beb con frecuencia. Hierva durante 20 minutos, una vez al da, todos los chupetes, las tetinas de los biberones y los juguetes que el beb se lleve a la boca. Despus de 1 semana de tratamiento, deseche los chupetes y las tetinas de los biberones y compre unos nuevos. Debe hervir durante 20 minutos, todos los Fisher, todos los componentes del sacaleche que estn en contacto con la [place]. Desafo: poca produccin de Cl[employer]ox [employer] beb no aumenta de peso como corresponde, es posible que usted no est produciendo US Airways. La produccin de Colgate Palmolive est basada en un sistema de oferta y [place]. La produccin de WPS Resources depende de la frecuencia y la eficacia con la que el beb vaca la mama.  Solucin Cuanto ms amamante y se extraiga [place], ms Comcast. Es importante que el beb vace al menos una de las mamas en cada alimentacin. Si no es as, use un sacaleche o extraiga la leche restante de forma manual. Esto ayudar a drenar la mayor cantidad de [place] posible cada vez que amamanta. Tambin ser Merrill Lynch para que el cuerpo produzca ms [place]. Si  el beb no le est vaciando las [address], esto puede deberse a problemas de prendida, succin o ubicacin. Si sigue teniendo poca leche despus de American Standard Companies, comunquese con su mdico o con un especialista en lactancia lo antes posible. Desafo: pezones invertidos o planos En algunas mujeres, los pezones estn metidos hacia adentro en vez de sobresalir. Otras mujeres tiene pezones planos. Los pezones invertidos o planos a veces pueden hacer que el beb tenga ms dificultades para prenderse a la mama. Solucin Le darn un pequeo dispositivo que saca hacia afuera los pezones invertidos. Este dispositivo debe colocarse inmediatamente antes de poner al beb en la mama. Tambin puede tratar de usar Oceanographer durante un corto tiempo antes de colocar al beb en la mama. El sacaleche puede sacar el pezn hacia afuera para ayudar a su beb a que se agarre con mayor facilidad. Tambin la succin del beb ayudar a que el pezn invertido sobresalga.  Si tiene los pezones planos, estimule al beb para que se prenda a la mama y Technical sales engineer con frecuencia los primeros das despus del nacimiento. Esto le dar la prctica para que se prenda correctamente mientras la mama est blanda. Cuando la produccin de leche aumente entre el segundo y el quinto da despus del parto y las mamas se llenen, el beb se prender ms fcilmente.  Si sigue teniendo inquietudes, comunquese con Press photographer, quien podr ensearle tcnicas adicionales para resolver los problemas de amamantamiento relacionados con la posicin y la forma del pezn.  PARA OBTENER MS INFORMACIN Liga internacional La Leche: [email or web address] Document Released: 06/14/2007 Document Revised: 04/22/2012 ExitCare Patient Information 2015 [place], [place]. This information is not intended to replace advice given to you by your health care provider. Make sure you discuss any questions you have with your health care provider.

## 2013-08-26 NOTE — Discharge Summary (Signed)
Obstetric Discharge Summary Reason for Admission: onset of labor Prenatal Procedures: ultrasound Intrapartum Procedures: spontaneous vaginal delivery Postpartum Procedures: none Complications-Operative and Postpartum: none Hemoglobin  Date Value Ref Range Status  08/24/2013 12.4  12.0 - 15.0 g/dL Final     HCT  Date Value Ref Range Status  08/24/2013 37.3  36.0 - 46.0 % Final    Physical Exam:  General: alert, cooperative and no distress Lochia: appropriate Uterine Fundus: firm Incision: n/a  DVT Evaluation: No evidence of DVT seen on physical exam. Negative Homan's sign. No cords or calf tenderness. No significant calf/ankle edema.  Discharge Diagnoses: Term Pregnancy-delivered Delivery Note on 08/24/13:  At 4:48 PM a viable female was delivered via (Presentation:oa,with nuchal cord and occult arm which was reduced without difficulty. APGAR:9 ,9 ; weight 7 lb 7 oz (3374 g).  Placenta status:spont , . 3vc Cord: with the following complications:none . Cord pH: n/a  Anesthesia: none  Episiotomy: none  Lacerations: none  Suture Repair: n/a  Est. Blood Loss (mL): 200 Mom to postpartum. Baby to Couplet care / Skin to Skin.  Discharge Information: Date: 08/26/2013 Activity: pelvic rest Diet: routine Medications: Ibuprofen Condition: stable Instructions: refer to practice specific booklet Discharge to: home Follow-up Information   [facility] an [facility] as [facility] as [facility] [place] (In 4-6 weeks for postpartum visit)    Contact information:   [address] [place] [id number] [phone]      Newborn Data: Live born female  Birth Weight: 7 lb 7 oz (3374 g) APGAR: 9,   Home with mother.  LEFTWICH-KIRBY, Lynanne Delgreco 08/26/2013, 9:01 AM

## 2013-08-26 NOTE — Progress Notes (Signed)
I assisted Dr Ezequiel EssexGable with care plan for the baby and Lupita LeashDonna RN with discharge instructions for Mom.  Shaun Runyon  H Cammeron Greis Interpreter.

## 2013-11-10 ENCOUNTER — Encounter (HOSPITAL_COMMUNITY): Payer: Self-pay | Admitting: *Deleted

## 2014-07-29 IMAGING — US US OB COMP +14 WK
1 series · 12 of 28 positions shown · non-contrast
Comparison: none

[Series 1: us ob comp +14 wk · 12 of 95 slices shown]
[im 4/95]
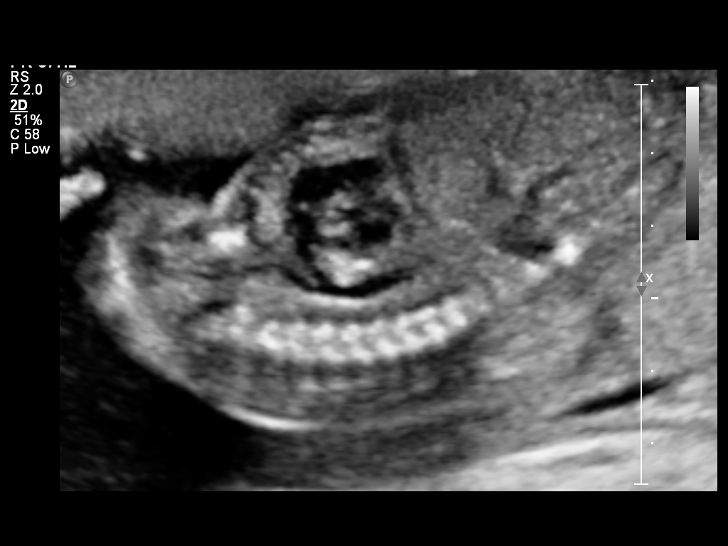
[im 11/95]
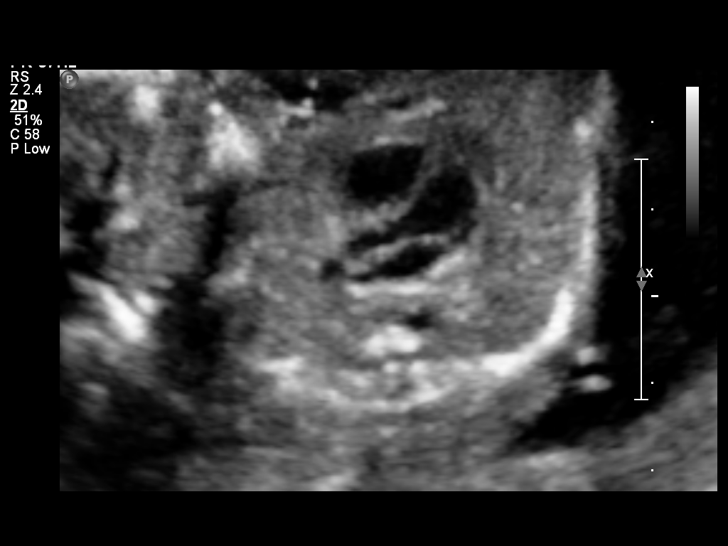
[im 18/95]
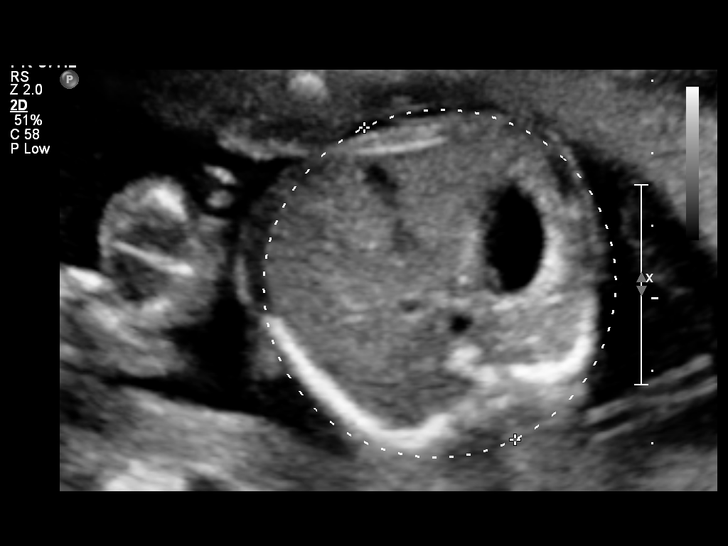
[im 28/95]
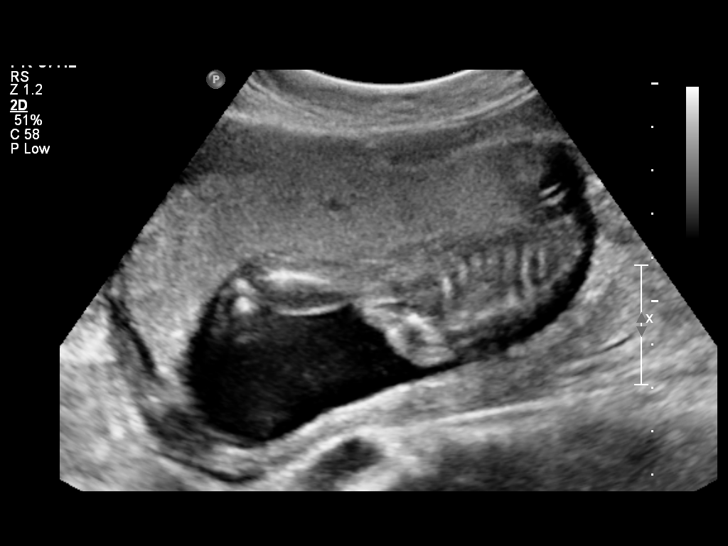
[im 35/95]
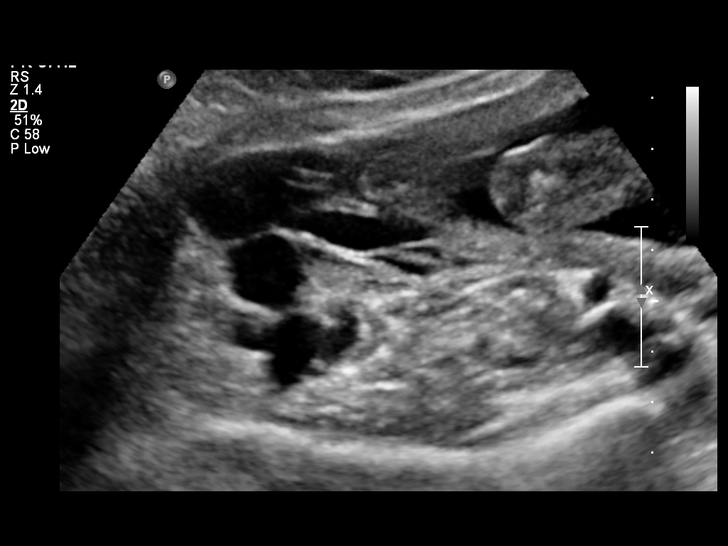
[im 42/95]
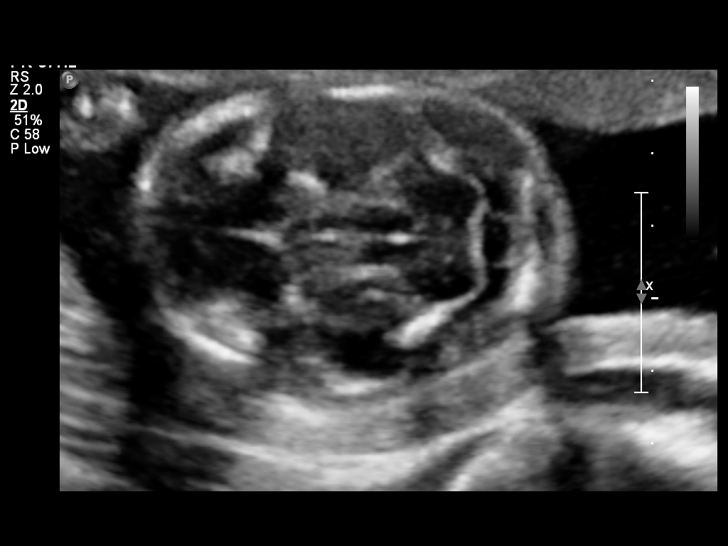
[im 53/95]
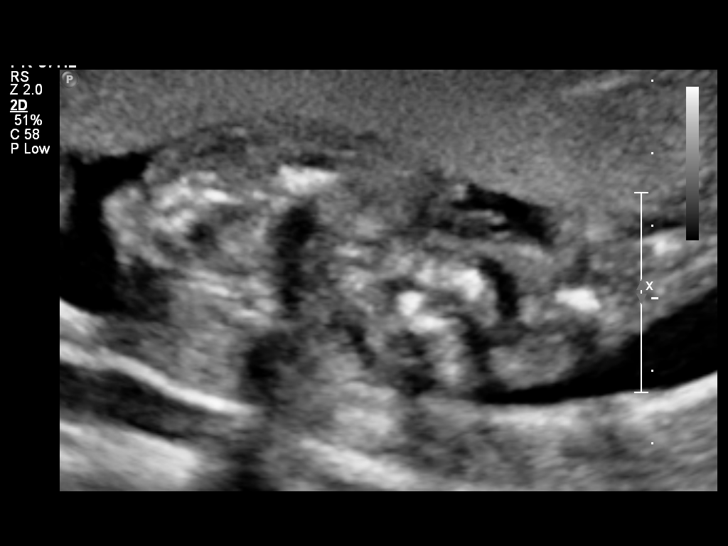
[im 60/95]
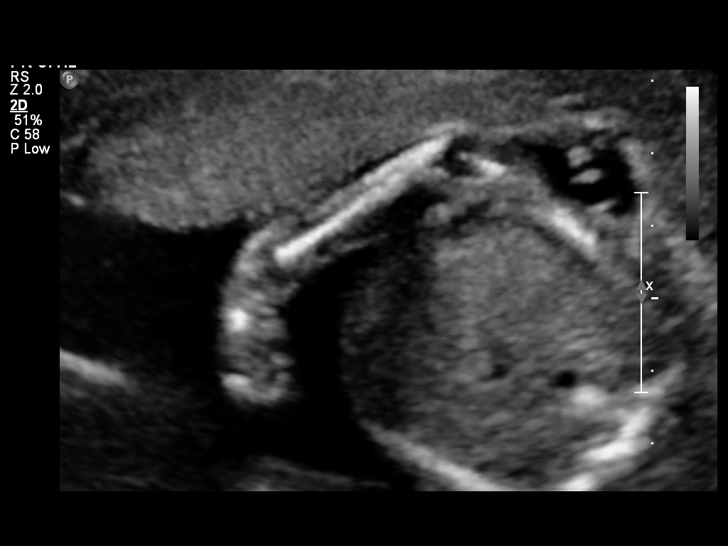
[im 67/95]
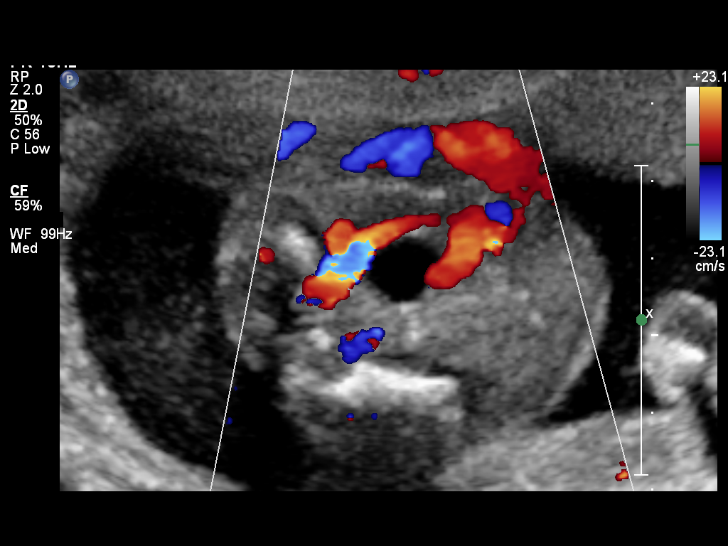
[im 77/95]
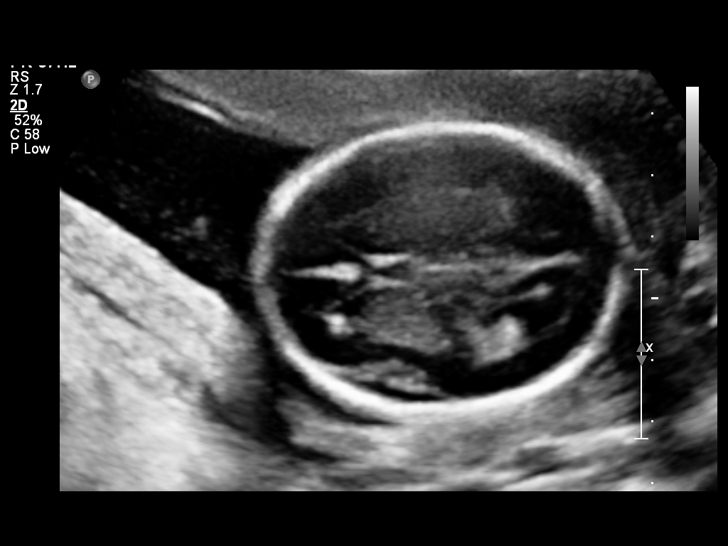
[im 84/95]
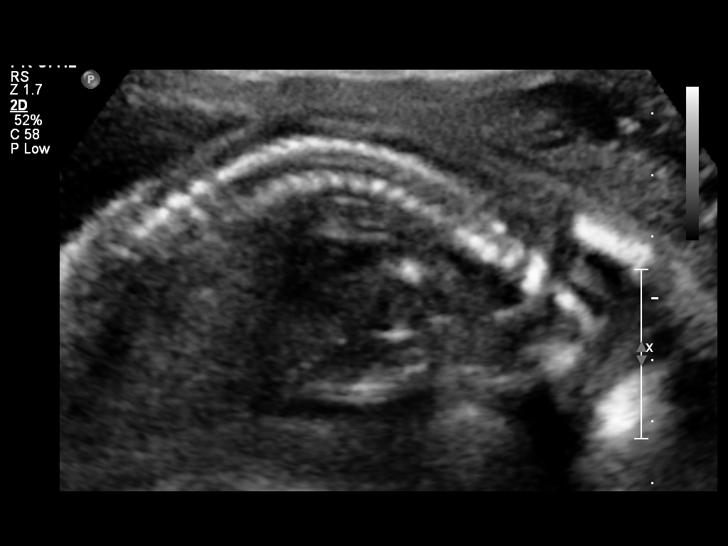
[im 91/95]
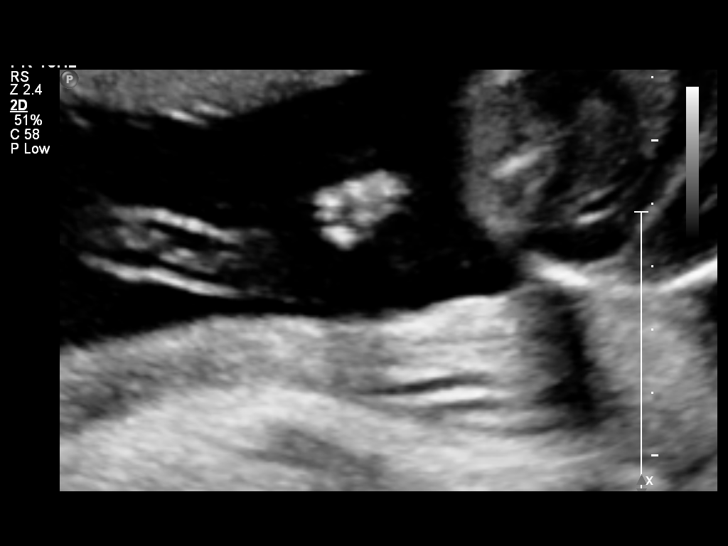

[12 of 28 positions shown; findings below may reference images not displayed]

OBSTETRICS REPORT
                      (Signed Final 04/14/2013 [DATE])

                 RDMS-US
Service(s) Provided

 US OB COMP + 14 WK                                    76805.1
Indications

 Basic anatomic survey
Fetal Evaluation

 Num Of Fetuses:    1
 Fetal Heart Rate:  146                          bpm
 Cardiac Activity:  Observed
 Presentation:      Variable
 Placenta:          Anterior, above cervical os
 P. Cord            Visualized
 Insertion:

 Amniotic Fluid
 AFI FV:      Subjectively within normal limits
                                             Larg Pckt:     4.1  cm
Biometry

 BPD:     45.6  mm     G. Age:  19w 5d                CI:        68.83   70 - 86
                                                      FL/HC:      17.4   16.8 -

 HC:     175.6  mm     G. Age:  20w 0d       46  %    HC/AC:      1.16   1.09 -

 AC:     151.9  mm     G. Age:  20w 3d       58  %    FL/BPD:
 FL:      30.6  mm     G. Age:  19w 3d       24  %    FL/AC:      20.1   20 - 24
 HUM:     30.6  mm     G. Age:  20w 1d       57  %
 CER:     19.5  mm     G. Age:  18w 5d       16  %

 Est. FW:     325  gm    0 lb 11 oz      50  %
Gestational Age

 LMP:           20w 0d        Date:  11/25/12                 EDD:   09/01/13
 U/S Today:     19w 6d                                        EDD:   09/02/13
 Best:          20w 0d     Det. By:  LMP  (11/25/12)          EDD:   09/01/13
Anatomy



 Other:  Fetus appears to be a male. Heels appears normal.
Targeted Anatomy

 Fetal Central Nervous System
 Cisterna Magna:
Cervix Uterus Adnexa

 Cervix:       Normal appearance by transabdominal scan. Appears
               closed, without funnelling.

 Left Ovary:    Within normal limits.
 Right Ovary:   Within normal limits.
Impression

 SIUP at 67w7d on remote interpretation of fetal survey
 EFW 50th%
 No dysmorphic features
 No previa
Recommendations

 Follow up ultrasounds as clinically indicated.

## 2014-12-18 ENCOUNTER — Ambulatory Visit (INDEPENDENT_AMBULATORY_CARE_PROVIDER_SITE_OTHER): Payer: Self-pay | Admitting: Family Medicine

## 2014-12-18 ENCOUNTER — Encounter: Payer: Self-pay | Admitting: Family Medicine

## 2014-12-18 VITALS — BP 109/72 | HR 53 | Temp 97.8°F | Resp 16 | Ht 64.5 in | Wt 158.6 lb

## 2014-12-18 DIAGNOSIS — R102 Pelvic and perineal pain: Secondary | ICD-10-CM

## 2014-12-18 DIAGNOSIS — N926 Irregular menstruation, unspecified: Secondary | ICD-10-CM

## 2014-12-18 DIAGNOSIS — Z01419 Encounter for gynecological examination (general) (routine) without abnormal findings: Secondary | ICD-10-CM

## 2014-12-18 LAB — CBC WITH DIFFERENTIAL/PLATELET
Basophils Absolute: 0 10*3/uL (ref 0.0–0.1)
Basophils Relative: 0 % (ref 0–1)
EOS ABS: 0.1 10*3/uL (ref 0.0–0.7)
EOS PCT: 1 % (ref 0–5)
HEMATOCRIT: 39.9 % (ref 36.0–46.0)
Hemoglobin: 13.2 g/dL (ref 12.0–15.0)
LYMPHS ABS: 2.4 10*3/uL (ref 0.7–4.0)
Lymphocytes Relative: 43 % (ref 12–46)
MCH: 27.3 pg (ref 26.0–34.0)
MCHC: 33.1 g/dL (ref 30.0–36.0)
MCV: 82.4 fL (ref 78.0–100.0)
MONOS PCT: 7 % (ref 3–12)
MPV: 9.8 fL (ref 8.6–12.4)
Monocytes Absolute: 0.4 10*3/uL (ref 0.1–1.0)
Neutro Abs: 2.7 10*3/uL (ref 1.7–7.7)
Neutrophils Relative %: 49 % (ref 43–77)
PLATELETS: 266 10*3/uL (ref 150–400)
RBC: 4.84 MIL/uL (ref 3.87–5.11)
RDW: 13.8 % (ref 11.5–15.5)
WBC: 5.5 10*3/uL (ref 4.0–10.5)

## 2014-12-18 LAB — POCT WET + KOH PREP
Trich by wet prep: ABSENT
YEAST BY WET PREP: ABSENT
Yeast by KOH: ABSENT

## 2014-12-18 LAB — POCT URINALYSIS DIP (MANUAL ENTRY)
BILIRUBIN UA: NEGATIVE
Bilirubin, UA: NEGATIVE
Blood, UA: NEGATIVE
Glucose, UA: NEGATIVE
Leukocytes, UA: NEGATIVE
Nitrite, UA: NEGATIVE
PH UA: 7
PROTEIN UA: NEGATIVE
SPEC GRAV UA: 1.01
Urobilinogen, UA: 0.2

## 2014-12-18 LAB — COMPREHENSIVE METABOLIC PANEL
ALT: 11 U/L (ref 6–29)
AST: 12 U/L (ref 10–30)
Albumin: 4.4 g/dL (ref 3.6–5.1)
Alkaline Phosphatase: 51 U/L (ref 33–115)
BUN: 9 mg/dL (ref 7–25)
CALCIUM: 8.9 mg/dL (ref 8.6–10.2)
CHLORIDE: 103 mmol/L (ref 98–110)
CO2: 26 mmol/L (ref 20–31)
Creat: 0.73 mg/dL (ref 0.50–1.10)
Glucose, Bld: 92 mg/dL (ref 65–99)
Potassium: 4.2 mmol/L (ref 3.5–5.3)
Sodium: 138 mmol/L (ref 135–146)
Total Bilirubin: 0.6 mg/dL (ref 0.2–1.2)
Total Protein: 7.3 g/dL (ref 6.1–8.1)

## 2014-12-18 LAB — TSH: TSH: 1.098 u[IU]/mL (ref 0.350–4.500)

## 2014-12-18 LAB — POCT URINE PREGNANCY: Preg Test, Ur: NEGATIVE

## 2014-12-18 NOTE — Patient Instructions (Signed)
Dolor plvico, mujeres (Pelvic Pain, Female)  Las causa del dolor plvico en la mujer pueden ser muchas y pueden tener su origen en diferentes lugares. El dolor plvico es el que aparece en la mitad inferior del abdomen y entre las caderas. Puede aparecer durante en un perodo corto de tiempo (agudo)o puede ser recurrente (crnico). Esta afeccin puede estar relacionada con trastornos que afectan a los rganos reproductivos femeninos (ginecolgica), pero tambin puede deberse a problemas en la vejiga, clculos renales, complicaciones intestinales, o problemas musculares o esquelticos. Es importante solicitar ayuda de inmediato, sobre todo si ha sido intenso, agudo, o ha aparecido de manera sbita como un dolor inusual. Tambin es importante obtener ayuda de inmediato, ya que algunos tipos de dolor plvico puede poner en peligro la vida.  CAUSAS  A continuacin veremos algunas de las causas del dolor plvico. Las causas pueden clasificarse de diferentes modos.   Ginecolgica.  Enfermedad inflamatoria plvica.  Infecciones de transmisin sexual.  Quiste de ovario o torsin de un ligamento ovrico ( torsin ovrica).  La membrana que recubre internamente al tero desarrollndose fuera del tero (endometriosis).  Fibromas, quistes o tumores.  Ovulacin.  Embarazo.  Embarazo fuera del tero (embarazo ectpico).  Aborto espontneo.  Trabajo de parto.  Desprendimiento de la placenta o ruptura del tero.  Infecciones.  Infeccin uterina (endometritis).  Infeccin de la vejiga.  Diverticulitis.  Aborto relacionado con una infeccin uterina (aborto sptico).  Vejiga.  Inflamacin de la vejiga (cistitis).  Clculos renales.  Gastrointenstinal.  Estreimiento.  Diverticulitis.  Neurolgico.  Traumatismos.  Sentir dolor plvico debido a causas mentales o emocionales (psicosomtico).  Tumores en el intestino o en la pelvis. EVALUACIN  El mdico har una historia  clnica detallada segn sus sntomas. Incluir los cambios recientes en su salud, una cuidadosa historia ginecolgica de sus periodos (menstruaciones) y una historia de su actividad sexual. Los antecedentes familiares y la historia clnica tambin son importantes. Su mdico podr indicar un examen plvico. El examen plvico ayudar a identificar la ubicacin y la gravedad del dolor. Tambin ayudar a evaluar los rganos que pueden estar involucrados. . Para identificar la causa del dolor plvico y tratarlo adecuadamente, el mdico puede indicar estudios. Estas pruebas pueden ser:   Test de embarazo.  Ecografa plvica.  Radiografa del abdomen.  Un anlisis de orina o la evaluacin de la secrecin vaginal.  Anlisis de sangre. INSTRUCCIONES PARA EL CUIDADO EN EL HOGAR   Solo tome medicamentos de venta libre o recetados para el dolor, malestar o fiebre, segn las indicaciones del mdico.   Haga reposo segn las indicaciones del mdico.   Consuma una dieta balanceada.   Beba gran cantidad de lquido para mantener la orina de tono claro o amarillo plido.   Evite las relaciones sexuales, si le producen dolor.   Aplique compresas calientes o fras en la zona baja del abdomen segn cual le calme el dolor.   Evite las situaciones estresantes.   Lleve un registro del dolor plvico. Anote cundo comenz, dnde se localiza el dolor y si hay cosas que parecen estar asociadas con el dolor, como algn alimento o su ciclo menstrual.  Concurra a las visitas de control con el mdico, segn las indicaciones.  SOLICITE ATENCIN MDICA SI:   Los medicamentos no le calman el dolor.  Tiene flujo vaginal anormal. SOLICITE ATENCIN MDICA DE INMEDIATO SI:   Tiene un sangrado abundante por la vagina.   El dolor plvico aumenta.   Se siente mareada o sufre un desmayo.     Siente escalofros.   Siente dolor intenso al orinar u observa sangre en la orina.   Tiene diarrea o vmitos que  no puede controlar.   Tiene fiebre o sntomas que persisten durante ms de 3 das.  Tiene fiebre y los sntomas empeoran.   Ha sido abusada fsica o sexualmente.  ASEGRESE DE QUE:   Comprende estas instrucciones.  Controlar su enfermedad.  Solicitar ayuda de inmediato si no mejora o si empeora.   Esta informacin no tiene como fin reemplazar el consejo del mdico. Asegrese de hacerle al mdico cualquier pregunta que tenga.   Document Released: 03/24/2008 Document Revised: 05/12/2014 Elsevier Interactive Patient Education 2016 [employer]  

## 2014-12-18 NOTE — Progress Notes (Signed)
Subjective:    Patient ID: Laura Brady, female    DOB: 06/11/1985, 29 y.o.   MRN: [id number]  12/18/2014  Annual Exam   HPI This 29 y.o. female presents to establish care.  Last physical:  11/2013 with IUD insertion Pap smear:  [facility]2013 [facility] TDAP:  2012 Influenza:  Declines Eye exam:  In [place]; none; +glasses Dental exam:  None  IUD insertion 11/2013.   Regular menses. Having pain between menses; mild discharge intermittently. Having pain with intercourse.  Onset immediately after insertion of IUD.  Having pain in pelvic area between menses.  Heavy menses at times; having some bleeding between menses.    +breastfeeding.   Review of Systems  Constitutional: Negative for fever, chills, diaphoresis, activity change, appetite change, fatigue and unexpected weight change.  HENT: Negative for congestion, dental problem, drooling, ear discharge, ear pain, facial swelling, hearing loss, mouth sores, nosebleeds, postnasal drip, rhinorrhea, sinus pressure, sneezing, sore throat, tinnitus, trouble swallowing and voice change.   Eyes: Negative for photophobia, pain, discharge, redness, itching and visual disturbance.  Respiratory: Negative for apnea, cough, choking, chest tightness, shortness of breath, wheezing and stridor.   Cardiovascular: Negative for chest pain, palpitations and leg swelling.  Gastrointestinal: Negative for nausea, vomiting, abdominal pain, diarrhea, constipation, blood in stool, abdominal distention, anal bleeding and rectal pain.  Endocrine: Negative for cold intolerance, heat intolerance, polydipsia, polyphagia and polyuria.  Genitourinary: Positive for vaginal bleeding, vaginal discharge, menstrual problem and pelvic pain. Negative for dysuria, urgency, frequency, hematuria, flank pain, decreased urine volume, enuresis, difficulty urinating, genital sores, vaginal pain and dyspareunia.  Musculoskeletal: Negative for  myalgias, back pain, joint swelling, arthralgias, gait problem, neck pain and neck stiffness.  Skin: Negative for color change, pallor, rash and wound.  Allergic/Immunologic: Negative for environmental allergies, food allergies and immunocompromised state.  Neurological: Positive for headaches. Negative for dizziness, tremors, seizures, syncope, facial asymmetry, speech difficulty, weakness, light-headedness and numbness.  Hematological: Negative for adenopathy. Does not bruise/bleed easily.  Psychiatric/Behavioral: Negative for suicidal ideas, hallucinations, behavioral problems, confusion, sleep disturbance, self-injury, dysphoric mood, decreased concentration and agitation. The patient is not nervous/anxious and is not hyperactive.     History reviewed. No pertinent past medical history. History reviewed. No pertinent past surgical history. No Known Allergies Current Outpatient Prescriptions  Medication Sig Dispense Refill  . Prenatal Vit-Fe Fumarate-FA (PRENATAL MULTIVITAMIN) TABS tablet Take 1 tablet by mouth daily at 12 noon.     No current facility-administered medications for this visit.   Social History   Social History  . Marital Status: Single    Spouse Name: N/A  . Number of Children: N/A  . Years of Education: N/A   Occupational History  . Not on file.   Social History Main Topics  . Smoking status: Never Smoker   . Smokeless tobacco: Never Used  . Alcohol Use: No  . Drug Use: No  . Sexual Activity: Yes    Birth Control/ Protection: IUD   Other Topics Concern  . Not on file   Social History Narrative   Marital status: dating seriously x 9 years      Children: 3 children (9, 944,1)      Lives: with boyfriend, 3 chi.ldren      Employment: unemployed      Tobacco: none      Alcohol: weekends/once per month      Drugs: none      Exercise:  Some; gym membership.  Sexual activity: no STDs.         Family History  Problem Relation Age of Onset  . Diabetes  Mother   . Hyperlipidemia Mother   . Hypertension Mother   . Stroke Father   . Diabetes Father   . Diabetes Brother        Objective:    BP 109/72 mmHg  Pulse 53  Temp(Src) 97.8 F (36.6 C) (Oral)  Resp 16  Ht 5' 4.5" (1.638 m)  Wt 158 lb 9.6 oz (71.94 kg)  BMI 26.81 kg/m2  SpO2 98%  Breastfeeding? Yes Physical Exam  Constitutional: She is oriented to person, place, and time. She appears well-developed and well-nourished. No distress.  HENT:  Head: Normocephalic and atraumatic.  Right Ear: External ear normal.  Left Ear: External ear normal.  Nose: Nose normal.  Mouth/Throat: Oropharynx is clear and moist.  Eyes: Conjunctivae and EOM are normal. Pupils are equal, round, and reactive to light.  Neck: Normal range of motion and full passive range of motion without pain. Neck supple. No JVD present. Carotid bruit is not present. No thyromegaly present.  Cardiovascular: Normal rate, regular rhythm and normal heart sounds.  Exam reveals no gallop and no friction rub.   No murmur heard. Pulmonary/Chest: Effort normal and breath sounds normal. She has no wheezes. She has no rales. Right breast exhibits no inverted nipple, no mass, no nipple discharge, no skin change and no tenderness. Left breast exhibits no inverted nipple, no mass, no nipple discharge, no skin change and no tenderness. Breasts are symmetrical.  Abdominal: Soft. Bowel sounds are normal. She exhibits no distension and no mass. There is no tenderness. There is no rebound and no guarding.  Genitourinary: Uterus normal. There is no rash, tenderness or lesion on the right labia. There is no rash, tenderness or lesion on the left labia. Cervix exhibits friability. Cervix exhibits no motion tenderness and no discharge. Right adnexum displays tenderness. Right adnexum displays no mass and no fullness. Left adnexum displays tenderness. Left adnexum displays no mass and no fullness. No erythema, tenderness or bleeding in the  vagina. No foreign body around the vagina. Vaginal discharge found.  Musculoskeletal:       Right shoulder: Normal.       Left shoulder: Normal.       Cervical back: Normal.  Lymphadenopathy:    She has no cervical adenopathy.  Neurological: She is alert and oriented to person, place, and time. She has normal reflexes. No cranial nerve deficit. She exhibits normal muscle tone. Coordination normal.  Skin: Skin is warm and dry. No rash noted. She is not diaphoretic. No erythema. No pallor.  Psychiatric: She has a normal mood and affect. Her behavior is normal. Judgment and thought content normal.  Nursing note and vitals reviewed.  Results for orders placed or performed in visit on 12/18/14  POCT urinalysis dipstick  Result Value Ref Range   Color, UA yellow yellow   Clarity, UA clear clear   Glucose, UA negative negative   Bilirubin, UA negative negative   Ketones, POC UA negative negative   Spec Grav, UA 1.010    Blood, UA negative negative   pH, UA 7.0    Protein Ur, POC negative negative   Urobilinogen, UA 0.2    Nitrite, UA Negative Negative   Leukocytes, UA Negative Negative  POCT urine pregnancy  Result Value Ref Range   Preg Test, Ur Negative Negative       Assessment & Plan:  1. Encounter for routine gynecological examination   2. Pelvic pain in female   3. Irregular menstrual bleeding     Orders Placed This Encounter  Procedures  . US Pelvis Complete    Standing Status: Future     Number of Occurrences:      Standing Expiration Date: 02/18/2016    Order Specific Question:  Reason for Exam (SYMPTOM  OR DIAGNOSIS REQUIRED)    Answer:  pelvic pain for one year after IUD insertion; dyspareunia; pain with prolonged exercise    Order Specific Question:  Preferred imaging location?    Answer:  [facility]  . CBC with Differential/Platelet  . Comprehensive metabolic panel  . TSH  . POCT urinalysis dipstick  . POCT urine pregnancy  . POCT Wet + KOH Prep    No orders of the defined types were placed in this encounter.    No Follow-up on file.   Ellanor Feuerstein Paulita Fujita, M.D. [facility] [address] [place], [place]  [address] [phone] phone [phone] fax

## 2014-12-21 ENCOUNTER — Other Ambulatory Visit: Payer: Self-pay | Admitting: Family Medicine

## 2014-12-21 DIAGNOSIS — N926 Irregular menstruation, unspecified: Secondary | ICD-10-CM

## 2014-12-21 DIAGNOSIS — R102 Pelvic and perineal pain: Secondary | ICD-10-CM

## 2014-12-22 LAB — PAP IG, CT-NG, RFX HPV ASCU
CHLAMYDIA PROBE AMP: NOT DETECTED
GC PROBE AMP: NOT DETECTED

## 2015-01-05 ENCOUNTER — Telehealth: Payer: Self-pay | Admitting: Family Medicine

## 2015-01-05 NOTE — Telephone Encounter (Signed)
Patient came in wanting lab results please respond

## 2015-01-19 NOTE — Telephone Encounter (Signed)
Patient advised of results on 01/18/15 [facility].

## 2016-06-23 ENCOUNTER — Encounter: Payer: Self-pay | Admitting: Emergency Medicine

## 2016-06-23 ENCOUNTER — Ambulatory Visit (INDEPENDENT_AMBULATORY_CARE_PROVIDER_SITE_OTHER): Payer: Self-pay | Admitting: Emergency Medicine

## 2016-06-23 VITALS — BP 112/72 | HR 58 | Temp 98.1°F | Resp 16 | Ht 65.0 in | Wt 166.2 lb

## 2016-06-23 DIAGNOSIS — Z0000 Encounter for general adult medical examination without abnormal findings: Secondary | ICD-10-CM

## 2016-06-23 LAB — POCT WET + KOH PREP
TRICH BY WET PREP: ABSENT
Yeast by KOH: ABSENT
Yeast by wet prep: ABSENT

## 2016-06-23 NOTE — Patient Instructions (Addendum)
IF you received an x-ray today, you will receive an invoice from [facility]. Please contact [facility] at [phone] with questions or concerns regarding your invoice.   IF you received labwork today, you will receive an invoice from [place]. Please contact [facility] at [phone] with questions or concerns regarding your invoice.   Our billing staff will not be able to assist you with questions regarding bills from these companies.  You will be contacted with the lab results as soon as they are available. The fastest way to get your results is to activate your My Chart account. Instructions are located on the last page of this paperwork. If you have not heard from [place] regarding the results in 2 weeks, please contact this office.      Health Maintenance, Female Adopting a healthy lifestyle and getting preventive care can go a long way to promote health and wellness. Talk with your health care provider about what schedule of regular examinations is right for you. This is a good chance for you to check in with your provider about disease prevention and staying healthy. In between checkups, there are plenty of things you can do on your own. Experts have done a lot of research about which lifestyle changes and preventive measures are most likely to keep you healthy. Ask your health care provider for more information. Weight and diet Eat a healthy diet  Be sure to include plenty of vegetables, fruits, low-fat dairy products, and lean protein.  Do not eat a lot of foods high in solid fats, added sugars, or salt.  Get regular exercise. This is one of the most important things you can do for your health. ? Most adults should exercise for at least 150 minutes each week. The exercise should increase your heart rate and make you sweat (moderate-intensity exercise). ? Most adults should also do strengthening exercises at least twice a week. This is in addition to the  moderate-intensity exercise.  Maintain a healthy weight  Body mass index (BMI) is a measurement that can be used to identify possible weight problems. It estimates body fat based on height and weight. Your health care provider can help determine your BMI and help you achieve or maintain a healthy weight.  For females 39 years of age and older: ? A BMI below 18.5 is considered underweight. ? A BMI of 18.5 to 24.9 is normal. ? A BMI of 25 to 29.9 is considered overweight. ? A BMI of 30 and above is considered obese.  Watch levels of cholesterol and blood lipids  You should start having your blood tested for lipids and cholesterol at 29 years of age, then have this test every 5 years.  You may need to have your cholesterol levels checked more often if: ? Your lipid or cholesterol levels are high. ? You are older than 29 years of age. ? You are at high risk for heart disease.  Cancer screening Lung Cancer  Lung cancer screening is recommended for adults 61-67 years old who are at high risk for lung cancer because of a history of smoking.  A yearly low-dose CT scan of the lungs is recommended for people who: ? Currently smoke. ? Have quit within the past 15 years. ? Have at least a 30-pack-year history of smoking. A pack year is smoking an average of one pack of cigarettes a day for 1 year.  Yearly screening should continue until it has been 15 years since you quit.  Yearly screening should stop if you develop a health problem that would prevent you from having lung cancer treatment.  Breast Cancer  Practice breast self-awareness. This means understanding how your breasts normally appear and feel.  It also means doing regular breast self-exams. Let your health care provider know about any changes, no matter how small.  If you are in your 20s or 30s, you should have a clinical breast exam (CBE) by a health care provider every 1-3 years as part of a regular health exam.  If you  are 37 or older, have a CBE every year. Also consider having a breast X-ray (mammogram) every year.  If you have a family history of breast cancer, talk to your health care provider about genetic screening.  If you are at high risk for breast cancer, talk to your health care provider about having an MRI and a mammogram every year.  Breast cancer gene (BRCA) assessment is recommended for women who have family members with BRCA-related cancers. BRCA-related cancers include: ? Breast. ? Ovarian. ? Tubal. ? Peritoneal cancers.  Results of the assessment will determine the need for genetic counseling and BRCA1 and BRCA2 testing.  Cervical Cancer Your health care provider may recommend that you be screened regularly for cancer of the pelvic organs (ovaries, uterus, and vagina). This screening involves a pelvic examination, including checking for microscopic changes to the surface of your cervix (Pap test). You may be encouraged to have this screening done every 3 years, beginning at age 37.  For women ages 19-65, health care providers may recommend pelvic exams and Pap testing every 3 years, or they may recommend the Pap and pelvic exam, combined with testing for human papilloma virus (HPV), every 5 years. Some types of HPV increase your risk of cervical cancer. Testing for HPV may also be done on women of any age with unclear Pap test results.  Other health care providers may not recommend any screening for nonpregnant women who are considered low risk for pelvic cancer and who do not have symptoms. Ask your health care provider if a screening pelvic exam is right for you.  If you have had past treatment for cervical cancer or a condition that could lead to cancer, you need Pap tests and screening for cancer for at least 20 years after your treatment. If Pap tests have been discontinued, your risk factors (such as having a new sexual partner) need to be reassessed to determine if screening should  resume. Some women have medical problems that increase the chance of getting cervical cancer. In these cases, your health care provider may recommend more frequent screening and Pap tests.  Colorectal Cancer  This type of cancer can be detected and often prevented.  Routine colorectal cancer screening usually begins at 29 years of age and continues through 29 years of age.  Your health care provider may recommend screening at an earlier age if you have risk factors for colon cancer.  Your health care provider may also recommend using home test kits to check for hidden blood in the stool.  A small camera at the end of a tube can be used to examine your colon directly (sigmoidoscopy or colonoscopy). This is done to check for the earliest forms of colorectal cancer.  Routine screening usually begins at age 67.  Direct examination of the colon should be repeated every 5-10 years through 29 years of age. However, you may need to be screened more often if early forms of precancerous  polyps or small growths are found.  Skin Cancer  Check your skin from head to toe regularly.  Tell your health care provider about any new moles or changes in moles, especially if there is a change in a mole's shape or color.  Also tell your health care provider if you have a mole that is larger than the size of a pencil eraser.  Always use sunscreen. Apply sunscreen liberally and repeatedly throughout the day.  Protect yourself by wearing long sleeves, pants, a wide-brimmed hat, and sunglasses whenever you are outside.  Heart disease, diabetes, and high blood pressure  High blood pressure causes heart disease and increases the risk of stroke. High blood pressure is more likely to develop in: ? People who have blood pressure in the high end of the normal range (130-139/85-89 mm Hg). ? People who are overweight or obese. ? People who are African American.  If you are 29-40 years of age, have your blood  pressure checked every 3-5 years. If you are 49 years of age or older, have your blood pressure checked every year. You should have your blood pressure measured twice-once when you are at a hospital or clinic, and once when you are not at a hospital or clinic. Record the average of the two measurements. To check your blood pressure when you are not at a hospital or clinic, you can use: ? An automated blood pressure machine at a pharmacy. ? A home blood pressure monitor.  If you are between 24 years and 48 years old, ask your health care provider if you should take aspirin to prevent strokes.  Have regular diabetes screenings. This involves taking a blood sample to check your fasting blood sugar level. ? If you are at a normal weight and have a low risk for diabetes, have this test once every three years after 29 years of age. ? If you are overweight and have a high risk for diabetes, consider being tested at a younger age or more often. Preventing infection Hepatitis B  If you have a higher risk for hepatitis B, you should be screened for this virus. You are considered at high risk for hepatitis B if: ? You were born in a country where hepatitis B is common. Ask your health care provider which countries are considered high risk. ? Your parents were born in a high-risk country, and you have not been immunized against hepatitis B (hepatitis B vaccine). ? You have HIV or AIDS. ? You use needles to inject street drugs. ? You live with someone who has hepatitis B. ? You have had sex with someone who has hepatitis B. ? You get hemodialysis treatment. ? You take certain medicines for conditions, including cancer, organ transplantation, and autoimmune conditions.  Hepatitis C  Blood testing is recommended for: ? Everyone born from [birth year] through 1963. ? Anyone with known risk factors for hepatitis C.  Sexually transmitted infections (STIs)  You should be screened for sexually transmitted  infections (STIs) including gonorrhea and chlamydia if: ? You are sexually active and are younger than 29 years of age. ? You are older than 29 years of age and your health care provider tells you that you are at risk for this type of infection. ? Your sexual activity has changed since you were last screened and you are at an increased risk for chlamydia or gonorrhea. Ask your health care provider if you are at risk.  If you do not have HIV, but are at  risk, it may be recommended that you take a prescription medicine daily to prevent HIV infection. This is called pre-exposure prophylaxis (PrEP). You are considered at risk if: ? You are sexually active and do not regularly use condoms or know the HIV status of your partner(s). ? You take drugs by injection. ? You are sexually active with a partner who has HIV.  Talk with your health care provider about whether you are at high risk of being infected with HIV. If you choose to begin PrEP, you should first be tested for HIV. You should then be tested every 3 months for as long as you are taking PrEP. Pregnancy  If you are premenopausal and you may become pregnant, ask your health care provider about preconception counseling.  If you may become pregnant, take 400 to 800 micrograms (mcg) of folic acid every day.  If you want to prevent pregnancy, talk to your health care provider about birth control (contraception). Osteoporosis and menopause  Osteoporosis is a disease in which the bones lose minerals and strength with aging. This can result in serious bone fractures. Your risk for osteoporosis can be identified using a bone density scan.  If you are 70 years of age or older, or if you are at risk for osteoporosis and fractures, ask your health care provider if you should be screened.  Ask your health care provider whether you should take a calcium or vitamin D supplement to lower your risk for osteoporosis.  Menopause may have certain physical  symptoms and risks.  Hormone replacement therapy may reduce some of these symptoms and risks. Talk to your health care provider about whether hormone replacement therapy is right for you. Follow these instructions at home:  Schedule regular health, dental, and eye exams.  Stay current with your immunizations.  Do not use any tobacco products including cigarettes, chewing tobacco, or electronic cigarettes.  If you are pregnant, do not drink alcohol.  If you are breastfeeding, limit how much and how often you drink alcohol.  Limit alcohol intake to no more than 1 drink per day for nonpregnant women. One drink equals 12 ounces of beer, 5 ounces of wine, or 1 ounces of hard liquor.  Do not use street drugs.  Do not share needles.  Ask your health care provider for help if you need support or information about quitting drugs.  Tell your health care provider if you often feel depressed.  Tell your health care provider if you have ever been abused or do not feel safe at home. This information is not intended to replace advice given to you by your health care provider. Make sure you discuss any questions you have with your health care provider. Document Released: 07/11/2010 Document Revised: 06/03/2015 Document Reviewed: 09/29/2014 Elsevier Interactive Patient Education  2018 [place] (AHA) Exercise Recommendation  Being physically active is important to prevent heart disease and stroke, the nation's No. 1and No. 5killers. To improve overall cardiovascular health, we suggest at least 150 minutes per week of moderate exercise or 75 minutes per week of vigorous exercise (or a combination of moderate and vigorous activity). Thirty minutes a day, five times a week is an easy goal to remember. You will also experience benefits even if you divide your time into two or three segments of 10 to 15 minutes per day.  For people who would benefit from lowering their  blood pressure or cholesterol, we recommend 40 minutes of aerobic exercise of moderate  to vigorous intensity three to four times a week to lower the risk for heart attack and stroke.  Physical activity is anything that makes you move your body and burn calories.  This includes things like climbing stairs or playing sports. Aerobic exercises benefit your heart, and include walking, jogging, swimming or biking. Strength and stretching exercises are best for overall stamina and flexibility.  The simplest, positive change you can make to effectively improve your heart health is to start walking. It's enjoyable, free, easy, social and great exercise. A walking program is flexible and boasts high success rates because people can stick with it. It's easy for walking to become a regular and satisfying part of life.   For Overall Cardiovascular Health:  At least 30 minutes of moderate-intensity aerobic activity at least 5 days per week for a total of 150  OR   At least 25 minutes of vigorous aerobic activity at least 3 days per week for a total of 75 minutes; or a combination of moderate- and vigorous-intensity aerobic activity  AND   Moderate- to high-intensity muscle-strengthening activity at least 2 days per week for additional health benefits.  For Lowering Blood Pressure and Cholesterol  An average 40 minutes of moderate- to vigorous-intensity aerobic activity 3 or 4 times per week  What if I can't make it to the time goal? Something is always better than nothing! And everyone has to start somewhere. Even if you've been sedentary for years, today is the day you can begin to make healthy changes in your life. If you don't think you'll make it for 30 or 40 minutes, set a reachable goal for today. You can work up toward your overall goal by increasing your time as you get stronger. Don't let all-or-nothing thinking rob you of doing what you can every day.  Source:http://www.heart.org

## 2016-06-23 NOTE — Progress Notes (Signed)
Laura Brady 31 y.o.   Chief Complaint  Patient presents with  . Annual Exam    Pap    HISTORY OF PRESENT ILLNESS: This is a 31 y.o. female here for annual exam.  HPI   Prior to Admission medications   Medication Sig Start Date End Date Taking? Authorizing Provider  Prenatal Vit-Fe Fumarate-FA (PRENATAL MULTIVITAMIN) TABS tablet Take 1 tablet by mouth daily at 12 noon.    [provider]    No Known Allergies  Patient Active Problem List   Diagnosis Date Noted  . Vaginal delivery 08/25/2013  . Normal delivery 09/26/2010    No past medical history on file.  No past surgical history on file.  Social History   Social History  . Marital status: Single    Spouse name: N/A  . Number of children: N/A  . Years of education: N/A   Occupational History  . Not on file.   Social History Main Topics  . Smoking status: Never Smoker  . Smokeless tobacco: Never Used  . Alcohol use No  . Drug use: No  . Sexual activity: Yes    Birth control/ protection: IUD   Other Topics Concern  . Not on file   Social History Narrative   Marital status: dating seriously x 9 years      Children: 3 children (9, 26,1)      Lives: with boyfriend, 3 chi.ldren      Employment: unemployed      Tobacco: none      Alcohol: weekends/once per month      Drugs: none      Exercise:  Some; gym membership.      Sexual activity: no STDs.          Family History  Problem Relation Age of Onset  . Diabetes Mother   . Hyperlipidemia Mother   . Hypertension Mother   . Stroke Father   . Diabetes Father   . Diabetes Brother      Review of Systems  Constitutional: Negative.  Negative for chills, fever and weight loss.  HENT: Negative.   Eyes: Negative.   Respiratory: Negative.  Negative for cough and shortness of breath.   Cardiovascular: Negative.  Negative for chest pain and palpitations.  Gastrointestinal: Negative.  Negative for abdominal pain, diarrhea, nausea  and vomiting.  Genitourinary: Negative.  Negative for dysuria and hematuria.       Irregular menstrual bleedings  Musculoskeletal: Negative.  Negative for myalgias and neck pain.  Skin: Negative.  Negative for rash.  Neurological: Negative for dizziness and headaches.  Endo/Heme/Allergies: Negative.   All other systems reviewed and are negative.   Vitals:   06/23/16 1034  BP: 112/72  Pulse: (!) 58  Resp: 16  Temp: 98.1 F (36.7 C)    Physical Exam  Constitutional: She is oriented to person, place, and time. She appears well-developed and well-nourished.  HENT:  Head: Normocephalic and atraumatic.  Nose: Nose normal.  Mouth/Throat: Oropharynx is clear and moist.  Eyes: Conjunctivae and EOM are normal. Pupils are equal, round, and reactive to light.  Neck: Normal range of motion. Neck supple. No JVD present. No thyromegaly present.  Cardiovascular: Normal rate, regular rhythm, normal heart sounds and intact distal pulses.   Pulmonary/Chest: Effort normal and breath sounds normal.  Abdominal: Soft. Bowel sounds are normal. She exhibits no distension. There is no tenderness. Hernia confirmed negative in the right inguinal area and confirmed negative in the left inguinal area.  Genitourinary: Vagina normal. There is no rash, tenderness or lesion on the right labia. There is no rash, tenderness or lesion on the left labia. Cervix exhibits no motion tenderness, no discharge and no friability. Right adnexum displays no mass and no tenderness. Left adnexum displays no mass and no tenderness.  Genitourinary Comments: Visible IUD string  Musculoskeletal: Normal range of motion.  Lymphadenopathy:    She has no cervical adenopathy.       Right: No inguinal adenopathy present.       Left: No inguinal adenopathy present.  Neurological: She is alert and oriented to person, place, and time. No sensory deficit. She exhibits normal muscle tone.  Skin: Skin is warm and dry. Capillary refill takes  less than 2 seconds.  Psychiatric: She has a normal mood and affect. Her behavior is normal.  Vitals reviewed.    ASSESSMENT & PLAN: Telia was seen today for annual exam.  Diagnoses and all orders for this visit:  Routine general medical examination at a health care facility -     Pap IG, CT/NG w/ reflex HPV when ASC-U([facility]) -     CBC with Differential -     Comprehensive metabolic panel -     Hemoglobin A1c -     Ambulatory referral to Gynecology -     POCT Wet + KOH Prep   Patient Instructions       IF you received an x-ray today, you will receive an invoice from [facility]. Please contact [facility] at [phone] with questions or concerns regarding your invoice.   IF you received labwork today, you will receive an invoice from [facility]. Please contact [facility] at [phone] with questions or concerns regarding your invoice.   Our billing staff will not be able to assist you with questions regarding bills from these companies.  You will be contacted with the lab results as soon as they are available. The fastest way to get your results is to activate your My Chart account. Instructions are located on the last page of this paperwork. If you have not heard from [place] regarding the results in 2 weeks, please contact this office.      Health Maintenance, Female Adopting a healthy lifestyle and getting preventive care can go a long way to promote health and wellness. Talk with your health care provider about what schedule of regular examinations is right for you. This is a good chance for you to check in with your provider about disease prevention and staying healthy. In between checkups, there are plenty of things you can do on your own. Experts have done a lot of research about which lifestyle changes and preventive measures are most likely to keep you healthy. Ask your health care provider for more information. Weight and diet Eat a  healthy diet  Be sure to include plenty of vegetables, fruits, low-fat dairy products, and lean protein.  Do not eat a lot of foods high in solid fats, added sugars, or salt.  Get regular exercise. This is one of the most important things you can do for your health. ? Most adults should exercise for at least 150 minutes each week. The exercise should increase your heart rate and make you sweat (moderate-intensity exercise). ? Most adults should also do strengthening exercises at least twice a week. This is in addition to the moderate-intensity exercise.  Maintain a healthy weight  Body mass index (BMI) is a measurement that can be used to identify possible weight  problems. It estimates body fat based on height and weight. Your health care provider can help determine your BMI and help you achieve or maintain a healthy weight.  For females 34 years of age and older: ? A BMI below 18.5 is considered underweight. ? A BMI of 18.5 to 24.9 is normal. ? A BMI of 25 to 29.9 is considered overweight. ? A BMI of 30 and above is considered obese.  Watch levels of cholesterol and blood lipids  You should start having your blood tested for lipids and cholesterol at 29 years of age, then have this test every 5 years.  You may need to have your cholesterol levels checked more often if: ? Your lipid or cholesterol levels are high. ? You are older than 29 years of age. ? You are at high risk for heart disease.  Cancer screening Lung Cancer  Lung cancer screening is recommended for adults 16-74 years old who are at high risk for lung cancer because of a history of smoking.  A yearly low-dose CT scan of the lungs is recommended for people who: ? Currently smoke. ? Have quit within the past 15 years. ? Have at least a 30-pack-year history of smoking. A pack year is smoking an average of one pack of cigarettes a day for 1 year.  Yearly screening should continue until it has been 15 years since you  quit.  Yearly screening should stop if you develop a health problem that would prevent you from having lung cancer treatment.  Breast Cancer  Practice breast self-awareness. This means understanding how your breasts normally appear and feel.  It also means doing regular breast self-exams. Let your health care provider know about any changes, no matter how small.  If you are in your 20s or 30s, you should have a clinical breast exam (CBE) by a health care provider every 1-3 years as part of a regular health exam.  If you are 95 or older, have a CBE every year. Also consider having a breast X-ray (mammogram) every year.  If you have a family history of breast cancer, talk to your health care provider about genetic screening.  If you are at high risk for breast cancer, talk to your health care provider about having an MRI and a mammogram every year.  Breast cancer gene (BRCA) assessment is recommended for women who have family members with BRCA-related cancers. BRCA-related cancers include: ? Breast. ? Ovarian. ? Tubal. ? Peritoneal cancers.  Results of the assessment will determine the need for genetic counseling and BRCA1 and BRCA2 testing.  Cervical Cancer Your health care provider may recommend that you be screened regularly for cancer of the pelvic organs (ovaries, uterus, and vagina). This screening involves a pelvic examination, including checking for microscopic changes to the surface of your cervix (Pap test). You may be encouraged to have this screening done every 3 years, beginning at age 21.  For women ages 27-65, health care providers may recommend pelvic exams and Pap testing every 3 years, or they may recommend the Pap and pelvic exam, combined with testing for human papilloma virus (HPV), every 5 years. Some types of HPV increase your risk of cervical cancer. Testing for HPV may also be done on women of any age with unclear Pap test results.  Other health care providers  may not recommend any screening for nonpregnant women who are considered low risk for pelvic cancer and who do not have symptoms. Ask your health care provider if  a screening pelvic exam is right for you.  If you have had past treatment for cervical cancer or a condition that could lead to cancer, you need Pap tests and screening for cancer for at least 20 years after your treatment. If Pap tests have been discontinued, your risk factors (such as having a new sexual partner) need to be reassessed to determine if screening should resume. Some women have medical problems that increase the chance of getting cervical cancer. In these cases, your health care provider may recommend more frequent screening and Pap tests.  Colorectal Cancer  This type of cancer can be detected and often prevented.  Routine colorectal cancer screening usually begins at 29 years of age and continues through 29 years of age.  Your health care provider may recommend screening at an earlier age if you have risk factors for colon cancer.  Your health care provider may also recommend using home test kits to check for hidden blood in the stool.  A small camera at the end of a tube can be used to examine your colon directly (sigmoidoscopy or colonoscopy). This is done to check for the earliest forms of colorectal cancer.  Routine screening usually begins at age 17.  Direct examination of the colon should be repeated every 5-10 years through 29 years of age. However, you may need to be screened more often if early forms of precancerous polyps or small growths are found.  Skin Cancer  Check your skin from head to toe regularly.  Tell your health care provider about any new moles or changes in moles, especially if there is a change in a mole's shape or color.  Also tell your health care provider if you have a mole that is larger than the size of a pencil eraser.  Always use sunscreen. Apply sunscreen liberally and repeatedly  throughout the day.  Protect yourself by wearing long sleeves, pants, a wide-brimmed hat, and sunglasses whenever you are outside.  Heart disease, diabetes, and high blood pressure  High blood pressure causes heart disease and increases the risk of stroke. High blood pressure is more likely to develop in: ? People who have blood pressure in the high end of the normal range (130-139/85-89 mm Hg). ? People who are overweight or obese. ? People who are African American.  If you are 93-65 years of age, have your blood pressure checked every 3-5 years. If you are 79 years of age or older, have your blood pressure checked every year. You should have your blood pressure measured twice-once when you are at a hospital or clinic, and once when you are not at a hospital or clinic. Record the average of the two measurements. To check your blood pressure when you are not at a hospital or clinic, you can use: ? An automated blood pressure machine at a pharmacy. ? A home blood pressure monitor.  If you are between 55 years and 28 years old, ask your health care provider if you should take aspirin to prevent strokes.  Have regular diabetes screenings. This involves taking a blood sample to check your fasting blood sugar level. ? If you are at a normal weight and have a low risk for diabetes, have this test once every three years after 29 years of age. ? If you are overweight and have a high risk for diabetes, consider being tested at a younger age or more often. Preventing infection Hepatitis B  If you have a higher risk for hepatitis B,  you should be screened for this virus. You are considered at high risk for hepatitis B if: ? You were born in a country where hepatitis B is common. Ask your health care provider which countries are considered high risk. ? Your parents were born in a high-risk country, and you have not been immunized against hepatitis B (hepatitis B vaccine). ? You have HIV or AIDS. ? You  use needles to inject street drugs. ? You live with someone who has hepatitis B. ? You have had sex with someone who has hepatitis B. ? You get hemodialysis treatment. ? You take certain medicines for conditions, including cancer, organ transplantation, and autoimmune conditions.  Hepatitis C  Blood testing is recommended for: ? Everyone born from [birth year] through 1963. ? Anyone with known risk factors for hepatitis C.  Sexually transmitted infections (STIs)  You should be screened for sexually transmitted infections (STIs) including gonorrhea and chlamydia if: ? You are sexually active and are younger than 29 years of age. ? You are older than 29 years of age and your health care provider tells you that you are at risk for this type of infection. ? Your sexual activity has changed since you were last screened and you are at an increased risk for chlamydia or gonorrhea. Ask your health care provider if you are at risk.  If you do not have HIV, but are at risk, it may be recommended that you take a prescription medicine daily to prevent HIV infection. This is called pre-exposure prophylaxis (PrEP). You are considered at risk if: ? You are sexually active and do not regularly use condoms or know the HIV status of your partner(s). ? You take drugs by injection. ? You are sexually active with a partner who has HIV.  Talk with your health care provider about whether you are at high risk of being infected with HIV. If you choose to begin PrEP, you should first be tested for HIV. You should then be tested every 3 months for as long as you are taking PrEP. Pregnancy  If you are premenopausal and you may become pregnant, ask your health care provider about preconception counseling.  If you may become pregnant, take 400 to 800 micrograms (mcg) of folic acid every day.  If you want to prevent pregnancy, talk to your health care provider about birth control (contraception). Osteoporosis and  menopause  Osteoporosis is a disease in which the bones lose minerals and strength with aging. This can result in serious bone fractures. Your risk for osteoporosis can be identified using a bone density scan.  If you are 80 years of age or older, or if you are at risk for osteoporosis and fractures, ask your health care provider if you should be screened.  Ask your health care provider whether you should take a calcium or vitamin D supplement to lower your risk for osteoporosis.  Menopause may have certain physical symptoms and risks.  Hormone replacement therapy may reduce some of these symptoms and risks. Talk to your health care provider about whether hormone replacement therapy is right for you. Follow these instructions at home:  Schedule regular health, dental, and eye exams.  Stay current with your immunizations.  Do not use any tobacco products including cigarettes, chewing tobacco, or electronic cigarettes.  If you are pregnant, do not drink alcohol.  If you are breastfeeding, limit how much and how often you drink alcohol.  Limit alcohol intake to no more than 1 drink per day  for nonpregnant women. One drink equals 12 ounces of beer, 5 ounces of wine, or 1 ounces of hard liquor.  Do not use street drugs.  Do not share needles.  Ask your health care provider for help if you need support or information about quitting drugs.  Tell your health care provider if you often feel depressed.  Tell your health care provider if you have ever been abused or do not feel safe at home. This information is not intended to replace advice given to you by your health care provider. Make sure you discuss any questions you have with your health care provider. Document Released: 07/11/2010 Document Revised: 06/03/2015 Document Reviewed: 09/29/2014 Elsevier Interactive Patient Education  2018 [place] (AHA) Exercise Recommendation  Being physically  active is important to prevent heart disease and stroke, the nation's No. 1and No. 5killers. To improve overall cardiovascular health, we suggest at least 150 minutes per week of moderate exercise or 75 minutes per week of vigorous exercise (or a combination of moderate and vigorous activity). Thirty minutes a day, five times a week is an easy goal to remember. You will also experience benefits even if you divide your time into two or three segments of 10 to 15 minutes per day.  For people who would benefit from lowering their blood pressure or cholesterol, we recommend 40 minutes of aerobic exercise of moderate to vigorous intensity three to four times a week to lower the risk for heart attack and stroke.  Physical activity is anything that makes you move your body and burn calories.  This includes things like climbing stairs or playing sports. Aerobic exercises benefit your heart, and include walking, jogging, swimming or biking. Strength and stretching exercises are best for overall stamina and flexibility.  The simplest, positive change you can make to effectively improve your heart health is to start walking. It's enjoyable, free, easy, social and great exercise. A walking program is flexible and boasts high success rates because people can stick with it. It's easy for walking to become a regular and satisfying part of life.   For Overall Cardiovascular Health:  At least 30 minutes of moderate-intensity aerobic activity at least 5 days per week for a total of 150  OR   At least 25 minutes of vigorous aerobic activity at least 3 days per week for a total of 75 minutes; or a combination of moderate- and vigorous-intensity aerobic activity  AND   Moderate- to high-intensity muscle-strengthening activity at least 2 days per week for additional health benefits.  For Lowering Blood Pressure and Cholesterol  An average 40 minutes of moderate- to vigorous-intensity aerobic activity 3 or 4  times per week  What if I can't make it to the time goal? Something is always better than nothing! And everyone has to start somewhere. Even if you've been sedentary for years, today is the day you can begin to make healthy changes in your life. If you don't think you'll make it for 30 or 40 minutes, set a reachable goal for today. You can work up toward your overall goal by increasing your time as you get stronger. Don't let all-or-nothing thinking rob you of doing what you can every day.  Source:http://www.heart.Burnadette Pop, MD [facility]

## 2016-06-24 LAB — CBC WITH DIFFERENTIAL/PLATELET
Basophils Absolute: 0 10*3/uL (ref 0.0–0.2)
Basos: 0 %
EOS (ABSOLUTE): 0 10*3/uL (ref 0.0–0.4)
Eos: 1 %
Hematocrit: 38.8 % (ref 34.0–46.6)
Hemoglobin: 12.5 g/dL (ref 11.1–15.9)
Immature Grans (Abs): 0 10*3/uL (ref 0.0–0.1)
Immature Granulocytes: 0 %
LYMPHS: 35 %
Lymphocytes Absolute: 2.9 10*3/uL (ref 0.7–3.1)
MCH: 27.7 pg (ref 26.6–33.0)
MCHC: 32.2 g/dL (ref 31.5–35.7)
MCV: 86 fL (ref 79–97)
Monocytes Absolute: 0.4 10*3/uL (ref 0.1–0.9)
Monocytes: 5 %
Neutrophils Absolute: 4.8 10*3/uL (ref 1.4–7.0)
Neutrophils: 59 %
PLATELETS: 232 10*3/uL (ref 150–379)
RBC: 4.52 x10E6/uL (ref 3.77–5.28)
RDW: 14.3 % (ref 12.3–15.4)
WBC: 8.2 10*3/uL (ref 3.4–10.8)

## 2016-06-24 LAB — HEMOGLOBIN A1C
Est. average glucose Bld gHb Est-mCnc: 111 mg/dL
Hgb A1c MFr Bld: 5.5 % (ref 4.8–5.6)

## 2016-06-24 LAB — COMPREHENSIVE METABOLIC PANEL
ALT: 13 IU/L (ref 0–32)
AST: 14 IU/L (ref 0–40)
Albumin/Globulin Ratio: 1.8 (ref 1.2–2.2)
Albumin: 4.3 g/dL (ref 3.5–5.5)
Alkaline Phosphatase: 44 IU/L (ref 39–117)
BILIRUBIN TOTAL: 0.3 mg/dL (ref 0.0–1.2)
BUN / CREAT RATIO: 12 (ref 9–23)
BUN: 9 mg/dL (ref 6–20)
CO2: 25 mmol/L (ref 20–29)
Calcium: 9 mg/dL (ref 8.7–10.2)
Chloride: 102 mmol/L (ref 96–106)
Creatinine, Ser: 0.73 mg/dL (ref 0.57–1.00)
GFR calc non Af Amer: 112 mL/min/{1.73_m2} (ref >59)
GFR, EST AFRICAN AMERICAN: 129 mL/min/{1.73_m2} (ref >59)
GLOBULIN, TOTAL: 2.4 g/dL (ref 1.5–4.5)
Glucose: 88 mg/dL (ref 65–99)
Potassium: 4.2 mmol/L (ref 3.5–5.2)
SODIUM: 141 mmol/L (ref 134–144)
TOTAL PROTEIN: 6.7 g/dL (ref 6.0–8.5)

## 2016-06-26 LAB — PAP IG, CT-NG, RFX HPV ASCU
Chlamydia, Nuc. Acid Amp: NEGATIVE
GONOCOCCUS BY NUCLEIC ACID AMP: NEGATIVE
PAP SMEAR COMMENT: 0

## 2016-06-29 ENCOUNTER — Telehealth: Payer: Self-pay | Admitting: Obstetrics and Gynecology

## 2016-06-29 NOTE — Telephone Encounter (Signed)
Called and left a message for patient to call back to schedule a new patient doctor referral for IUD removal only.

## 2016-07-06 ENCOUNTER — Ambulatory Visit (INDEPENDENT_AMBULATORY_CARE_PROVIDER_SITE_OTHER): Payer: Self-pay | Admitting: Obstetrics and Gynecology

## 2016-07-06 ENCOUNTER — Encounter: Payer: Self-pay | Admitting: Obstetrics and Gynecology

## 2016-07-06 VITALS — BP 102/60 | HR 60 | Resp 14 | Ht 63.5 in | Wt 166.0 lb

## 2016-07-06 DIAGNOSIS — N941 Unspecified dyspareunia: Secondary | ICD-10-CM

## 2016-07-06 DIAGNOSIS — Z30432 Encounter for removal of intrauterine contraceptive device: Secondary | ICD-10-CM

## 2016-07-06 DIAGNOSIS — Z3009 Encounter for other general counseling and advice on contraception: Secondary | ICD-10-CM

## 2016-07-06 DIAGNOSIS — N946 Dysmenorrhea, unspecified: Secondary | ICD-10-CM

## 2016-07-06 MED ORDER — ETONOGESTREL-ETHINYL ESTRADIOL 0.12-0.015 MG/24HR VA RING: VAGINAL_RING | VAGINAL | 0 refills | Status: DC

## 2016-07-06 NOTE — Progress Notes (Signed)
31 y.o. [id number] SingleHispanicF here to have her IUD removed.  She has been having problems with the IUD, thinks the paragard. [facility]  Cycles are every month, just not as predictable, every 3-4 weeks. Heavier than prior to the IUD. The cramps are worse with the IUD. She has some deep dyspareunia intermittently, since she got the IUD.  Period Duration (Days): 4-7 days  Period Pattern: (!) Irregular Menstrual Flow: Heavy Menstrual Control: Maxi pad Menstrual Control Change Freq (Hours): changes pad every 2-3 hours  Dysmenorrhea: (!) Moderate Dysmenorrhea Symptoms: Cramping and back pain   Patient's last menstrual period was 07/05/2016.          Sexually active: Yes.    The current method of family planning is IUD.    Exercising: No.  The patient does not participate in regular exercise at present. Smoker:  no  Health Maintenance: Pap:  06-23-16 WNL   History of abnormal Pap:  no TDaP:  09-26-10 Gardasil: No   reports that she has never smoked. She has never used smokeless tobacco. She reports that she does not drink alcohol or use drugs.  No past medical history on file.  No past surgical history on file.  No current outpatient prescriptions on file.   No current facility-administered medications for this visit.     Family History  Problem Relation Age of Onset  . Diabetes Mother   . Hyperlipidemia Mother   . Hypertension Mother   . Brain cancer Mother   . Uterine cancer Mother   . Stroke Father   . Diabetes Father   . Diabetes Brother     Review of Systems  Constitutional: Negative.   HENT: Negative.   Eyes: Negative.   Respiratory: Negative.   Cardiovascular: Negative.   Gastrointestinal: Negative.   Endocrine: Negative.   Genitourinary: Negative.        Dysmenorrhea   Musculoskeletal: Negative.   Skin: Negative.   Allergic/Immunologic: Negative.   Neurological: Negative.   Psychiatric/Behavioral: Negative.     Exam:   BP  102/60 (BP Location: Right Arm, Patient Position: Sitting, Cuff Size: Normal)   Pulse 60   Resp 14   Ht 5' 3.5" (1.613 m)   Wt 166 lb (75.3 kg)   LMP 07/05/2016   BMI 28.94 kg/m   Weight change: @WEIGHTCHANGE @ Height:   Height: 5' 3.5" (161.3 cm)  Ht Readings from Last 3 Encounters:  07/06/16 5' 3.5" (1.613 m)  06/23/16 5\' 5"  (1.651 m)  12/18/14 5' 4.5" (1.638 m)    General appearance: alert, cooperative and appears stated age   Pelvic: External genitalia:  no lesions              Urethra:  normal appearing urethra with no masses, tenderness or lesions              Bartholins and Skenes: normal                 Vagina: normal appearing vagina with normal color and discharge, no lesions              Cervix: no lesions and IUD string 4 cm. IUD removed with ringed forceps                Chaperone was present for exam.  A:  Contraception management  Increased cramps and heavier cycles with the IUD  P:   IUD pulled  Start the nuvaring today, no contraindications, risks reviewed  F/U  in 3 months  Discussed the option of laparoscopic sterilization (via salpingectomy) or vasectomy (she is worried about cost)    Spent 25 minutes face to face with the patient, 70% in counseling

## 2016-07-06 NOTE — Patient Instructions (Signed)
Ethinyl Estradiol; Etonogestrel vaginal ring Qu es este medicamento? El ETINIL ESTRADIOL; ETONOGESTREL es un anillo vaginal flexible utilizado como un anticonceptivo (control de natalidad). Este producto combina dos tipos de hormonas femeninas, un estrgeno y una progestina. Este anillo se utiliza para evitar la ovulacin y el embarazo. Cada anillo es efectivo durante un mes. Este medicamento puede ser utilizado para otros usos; si tiene alguna pregunta consulte con su proveedor de atencin mdica o con su farmacutico. MARCAS COMUNES: NuvaRing Qu le debo informar a mi profesional de la salud antes de tomar este medicamento? Necesita saber si usted presenta alguno de los siguientes problemas o situaciones: -sangrado vaginal anormal -enfermedad vascular o cogulos sanguneos -cncer de mama, cervical, endometrio, ovario, hgado o uterino -diabetes -enfermedad de la vescula biliar -enfermedad cardiaca o ataque cardiaco reciente -alta presin sangunea -niveles elevados de colesterol -enfermedad renal -enfermedad heptica -migraa -derrame cerebral -lupus eritematoso sistmico (LES) -fumador -una reaccin alrgica o inusual a los estrgenos, progestinas, otros medicamentos, alimentos, colorantes o conservantes -si est embarazada o buscando quedar embarazada -si est amamantando a un beb Cmo debo utilizar este medicamento? Inserte el anillo en la vagina segn las instrucciones. Siga las instrucciones de la etiqueta del medicamento. El anillo permanecer en su lugar durante 3 semanas y luego se retira para un descanso de 1 semana. Se inserta un anillo nuevo 1 semana despus de que se retir el ltimo anillo, el mismo da de la semana. Revise frecuentemente para asegurarse de que el anillo siga en su lugar, en especial antes y despus de tener relaciones sexuales. Si el anillo estuvo fuera de la vagina por un perodo desconocido de tiempo, es posible que usted no est protegida contra un  embarazo. Realcese una prueba de embarazo y llame a su mdico. No utilice su medicamento con una frecuencia mayor a la indicada. Recibir un folleto de informacin para el paciente con cada receta y en cada ocasin que la vuelva a surtir. Asegrese de leer este folleto cada vez cuidadosamente. Este folleto puede cambiar con frecuencia. Hable con su pediatra acerca del uso de este medicamento en nios. Puede requerir atencin especial. Este medicamento ha sido usado en nias que han empezado a tener perodos menstruales. Sobredosis: Pngase en contacto inmediatamente con un centro toxicolgico o una sala de urgencia si usted cree que haya tomado demasiado medicamento. ATENCIN: Este medicamento es solo para usted. No comparta este medicamento con nadie. Qu sucede si me olvido de una dosis? Generalmente, slo necesitar reemplazar su anillo vaginal una vez por mes. Pregunte a su profesional de la salud qu hacer si deja su anillo vaginal colocado durante un perodo de tiempo mayor o menor del que se supone o si se sale de lugar. Qu puede interactuar con este medicamento? No tome esta medicina con los siguientes medicamentos: dasabuvir; ombitasvir: paritaprevir; ritonavir ombitasvir; paritaprevir; ritonavir Este medicamento tambin puede interactuar con los siguientes medicamentos: acetaminofeno (paracetamol) antibiticos o medicamentos para infecciones, especialmente rifampicina, rifabutina, rifapentina y griseofulvina, y posiblemente penicilina o tetraciclina aprepitant cido ascrbico (vitamina C) atorvastatina barbitricos tales como el fenobarbital bosentano carbamazepina cafena clofibrato ciclosporina dantroleno doxercalciferol felbamato jugo de toronja hidrocortisona medicamentos para la ansiedad o para los problemas para conciliar el sueo, como diazepam o temazepam medicamentos para la diabetes, como pioglitazona modafinilo micofenolato nefazodona oxcarbazepina fenitona prednisolona ritonavir u  otros medicamentos para tratar el VIH o el SIDA rosuvastatina selegilina suplementos de isoflavonas de soya hierba de [place] tamoxifeno o raloxifeno teofilina hormonas tiroideas topiramato warfarina Puede ser que esta lista   no menciona todas las posibles interacciones. Informe a su profesional de la salud de todos los productos a base de hierbas, medicamentos de venta libre o suplementos nutritivos que est tomando. Si usted fuma, consume bebidas alcohlicas o si utiliza drogas ilegales, indqueselo tambin a su profesional de la salud. Algunas sustancias pueden interactuar con su medicamento. A qu debo estar atento al usar este medicamento? Visite a su mdico o a su profesional de la salud para revisar regularmente su evolucin. Debe hacerse exmenes de Papanicolaou y exmenes de las mamas y la pelvis en forma regular mientras est tomando este medicamento. Use un mtodo anticonceptivo adicional durante el primer ciclo que est usando este anillo. No use un diafragma ni condn femenino, ya que el anillo puede interferir con estos mtodos anticonceptivos y su colocacin correcta. Si tiene algn motivo para pensar que est embarazada, deje de usar este medicamento de inmediato y comunquese con su mdico o con su profesional de la salud. Si usa este medicamento para tratar problemas relacionados con las hormonas, pueden pasar varios ciclos hasta que observe mejoras en su condicin. El fumar aumenta el riesgo de formacin de cogulos o de sufrir un derrame cerebral mientras usa anticonceptivos hormonales, especialmente si tiene ms de 35 aos. Se le aconseja firmemente dejar de fumar. Este medicamento puede hacer que su cuerpo retenga lquido, lo que puede provocar que se le hinchen los dedos, las manos o los tobillos. Su presin sangunea puede aumentar. Contacte a su mdico o profesional de la salud si siente que est reteniendo lquido. Este medicamento puede aumentar su sensibilidad al sol. Evite la  luz solar. Si no la puede evitar, utilice ropa protectora y crema de proteccin solar. No utilice lmparas solares, camas solares ni cabinas solares. Si usa lentes de contacto y observa cambios en la visin, o si los lentes comienzan a resultarle incmodos, consulte al especialista en ojos. Algunas mujeres pueden presentar sensibilidad, hinchazn o sangrado leve de las encas. Informe a su dentista si esto sucede. Cepillarse los dientes y usar hilo dental regularmente pueden ayudar a limitar este problema. Visite peridicamente a su dentista e infrmele acerca de los medicamentos que est tomando. Si va a someterse a una operacin electiva, tal vez deba dejar de usar este medicamento de antemano. Consulte a su profesional de la salud. Este medicamento no la protege de la infeccin por VIH (SIDA) ni de ninguna otra enfermedad de transmisin sexual. Qu efectos secundarios puedo tener al utilizar este medicamento? Efectos secundarios que debe informar a su mdico o a su profesional de la salud tan pronto como sea posible: -secreciones o cambios en el tejido de las mamas -cambios en el sangrado vaginal durante el perodo menstrual o entre perodos menstruales -dolor en el pecho -tos con sangre -mareos o desmayos -dolores de cabeza o migraas -dolores de cabeza severos o repentinos -dolor estomacal (severo) -falta de aliento repentina -falta de coordinacin repentina, especialmente en un lado del cuerpo -problemas del habla -sntomas de infeccin vaginal como picazn, irritacin o flujo inusual -sensibilidad en la parte superior del abdomen -vmito -debilidad o entumecimiento de los brazos o piernas, especialmente de un lado del cuerpo -color amarillento de los ojos o la piel Efectos secundarios que, por lo general, no requieren atencin mdica (debe informarlos a su mdico o a su profesional de la salud si persisten o si son molestos): -sangrado o manchas inesperadas que continan despus de  los 3 primeros ciclos de pldoras -sensibilidad en las mamas -cambios de estados de nimo, ansiedad,   depresin, frustracin, ira o exaltacin -aumento de la sensibilidad al sol o a la luz ultravioleta -nuseas -erupcin cutnea, acn o manchas marrones en la piel -aumento de peso (leve) Puede ser que esta lista no menciona todos los posibles efectos secundarios. Comunquese a su mdico por asesoramiento mdico sobre los efectos secundarios. Usted puede informar los efectos secundarios a la FDA por telfono al 1-800-FDA-1088. Dnde debo guardar mi medicina? Mantngala fuera del alcance de los nios. Gurdela a temperatura ambiente, entre 15 y 30 grados C (59 y 86 grados F) durante hasta 4 meses. Este producto se expirara despus de 4 meses. Protjala de la luz. Deseche todos los medicamentos que no haya utilizado, despus de la fecha de vencimiento. ATENCIN: Este folleto es un resumen. Puede ser que no cubra toda la posible informacin. Si usted tiene preguntas acerca de esta medicina, consulte con su mdico, su farmacutico o su profesional de la salud.  2018 Elsevier/Gold Standard (2016-01-27 00:00:00)  

## 2016-10-11 ENCOUNTER — Ambulatory Visit (INDEPENDENT_AMBULATORY_CARE_PROVIDER_SITE_OTHER): Payer: Self-pay | Admitting: Obstetrics and Gynecology

## 2016-10-11 ENCOUNTER — Encounter: Payer: Self-pay | Admitting: Obstetrics and Gynecology

## 2016-10-11 VITALS — BP 92/58 | HR 60 | Resp 14 | Wt 171.0 lb

## 2016-10-11 DIAGNOSIS — N898 Other specified noninflammatory disorders of vagina: Secondary | ICD-10-CM

## 2016-10-11 DIAGNOSIS — N760 Acute vaginitis: Secondary | ICD-10-CM

## 2016-10-11 DIAGNOSIS — N941 Unspecified dyspareunia: Secondary | ICD-10-CM

## 2016-10-11 DIAGNOSIS — Z3009 Encounter for other general counseling and advice on contraception: Secondary | ICD-10-CM

## 2016-10-11 NOTE — Progress Notes (Signed)
GYNECOLOGY  VISIT   HPI: 31 y.o.   Single  Hispanic  female   [phone] with Patient's last menstrual period was 09/22/2016.   here for follow up on Nuvaring. Patient stopped using after 1 month.She had a hard time keeping the Nuvaring in place.  Patient is also c/o yellow vaginal discharge, itching and vaginal dryness x 2 weeks.     Prior to the nuvaring she had a paragard IUD and had heavy, crampy cycles with the IUD. She tried the nuvaring for 1 month, it wouldn't stay in. Not currently interested in OCP's. She c/o a yellow d/c and dryness externally. Uncomfortable when she is having sex with her husband. Using condoms, but not every time.  In the past she had weight gain and mood swings with OCP's. Is worried about being on hormones.   GYNECOLOGIC HISTORY: Patient's last menstrual period was 09/22/2016. Contraception:none  Menopausal hormone therapy: none         OB History    Gravida Para Term Preterm AB Living   0 0 3   SAB TAB Ectopic Multiple Live Births   0 0 0 0 3         There are no active problems to display for this patient.   No past medical history on file.  No past surgical history on file.  No current outpatient prescriptions on file.   No current facility-administered medications for this visit.      ALLERGIES: Patient has no known allergies.  Family History  Problem Relation Age of Onset  . Diabetes Mother   . Hyperlipidemia Mother   . Hypertension Mother   . Brain cancer Mother   . Uterine cancer Mother   . Stroke Father   . Diabetes Father   . Diabetes Brother     Social History   Social History  . Marital status: Single    Spouse name: N/A  . Number of children: N/A  . Years of education: N/A   Occupational History  . Not on file.   Social History Main Topics  . Smoking status: Never Smoker  . Smokeless tobacco: Never Used  . Alcohol use No  . Drug use: No  . Sexual activity: Yes    Partners: Male    Birth control/  protection: IUD   Other Topics Concern  . Not on file   Social History Narrative   Marital status: dating seriously x 9 years      Children: 3 children (9, 37,1)      Lives: with boyfriend, 3 chi.ldren      Employment: unemployed      Tobacco: none      Alcohol: weekends/once per month      Drugs: none      Exercise:  Some; gym membership.      Sexual activity: no STDs.          Review of Systems  Constitutional: Negative.   HENT: Negative.   Eyes: Negative.   Respiratory: Negative.   Cardiovascular: Negative.   Gastrointestinal: Negative.   Genitourinary:       Vaginal discharge, itching and vaginal dryness  Musculoskeletal: Negative.   Skin: Negative.   Neurological: Positive for headaches.  Endo/Heme/Allergies: Negative.   Psychiatric/Behavioral: Negative.     PHYSICAL EXAMINATION:    BP (!) 92/58 (BP Location: Right Arm, Patient Position: Sitting, Cuff Size: Normal)   Pulse 60   Resp 14   Wt 171 lb (77.6 kg)  LMP 09/22/2016   BMI 29.82 kg/m     General appearance: alert, cooperative and appears stated age  Pelvic: External genitalia:  no lesions              Urethra:  normal appearing urethra with no masses, tenderness or lesions              Bartholins and Skenes: normal                 Vagina: normal appearing vagina with normal color and discharge, no lesions              Cervix: no lesions               Chaperone was present for exam.  Wet prep: ? clue, no trich, rare wbc KOH: no yeast PH: 5   .  ASSESSMENT Vaginitis, suspect BV, but slides aren't clear Contraception, didn't tolerate the paragard, nuvaring or OCP's Vaginal dryness, discomfort with intercourse from the dryness   PLAN Husband is planning a vasectomy Use condoms until he is sterile Use a lubricant with intercourse Send vaginitis panel Use Vaseline externally Call if not improved   An After Visit Summary was printed and given to the patient.  ~25 minutes face to face  time of which over 50% was spent in counseling.   Used interpreter [employer] [phone]

## 2016-10-12 LAB — VAGINITIS/VAGINOSIS, DNA PROBE
CANDIDA SPECIES: NEGATIVE
GARDNERELLA VAGINALIS: NEGATIVE
Trichomonas vaginosis: NEGATIVE

## 2017-10-23 ENCOUNTER — Ambulatory Visit (INDEPENDENT_AMBULATORY_CARE_PROVIDER_SITE_OTHER): Payer: Self-pay | Admitting: Obstetrics and Gynecology

## 2017-10-23 ENCOUNTER — Other Ambulatory Visit: Payer: Self-pay

## 2017-10-23 ENCOUNTER — Telehealth: Payer: Self-pay | Admitting: Obstetrics and Gynecology

## 2017-10-23 ENCOUNTER — Encounter: Payer: Self-pay | Admitting: Obstetrics and Gynecology

## 2017-10-23 VITALS — BP 100/60 | HR 72 | Resp 16 | Ht 63.5 in | Wt 172.0 lb

## 2017-10-23 DIAGNOSIS — N644 Mastodynia: Secondary | ICD-10-CM

## 2017-10-23 DIAGNOSIS — N61 Mastitis without abscess: Secondary | ICD-10-CM

## 2017-10-23 MED ORDER — IBUPROFEN 800 MG PO TABS: 800.0000 mg | ORAL_TABLET | Freq: Three times a day (TID) | ORAL | 1 refills | Status: DC | PRN

## 2017-10-23 MED ORDER — TRAMADOL HCL 50 MG PO TABS: 50.0000 mg | ORAL_TABLET | Freq: Four times a day (QID) | ORAL | 0 refills | Status: DC | PRN

## 2017-10-23 MED ORDER — SULFAMETHOXAZOLE-TRIMETHOPRIM 800-160 MG PO TABS: 1.0000 | ORAL_TABLET | Freq: Two times a day (BID) | ORAL | 0 refills | Status: DC

## 2017-10-23 NOTE — Telephone Encounter (Signed)
Patient is having pain in the right breast muscle.

## 2017-10-23 NOTE — Telephone Encounter (Signed)
Patient reports 5 days of R breast pain. Pain with touching breast.  Agrees to office visit today for evaluation at 3:45 with Dr. Oscar La for evaluation.   Encounter closed.

## 2017-10-23 NOTE — Telephone Encounter (Signed)
Message left to return call to [place] at [phone].   Needs office visit with Dr. Oscar La

## 2017-10-23 NOTE — Progress Notes (Signed)
GYNECOLOGY  VISIT   HPI: 32 y.o.   Single Other or two or more races Hispanic or Latino  female   [phone] with Patient's last menstrual period was 10/21/2017 (exact date).   here for  Right breast pain. The pain started 5 days ago, the red area is getting bigger and bigger. She denies fever, c/o headache. No discharge from her breasts. She is not nursing.  GYNECOLOGIC HISTORY: Patient's last menstrual period was 10/21/2017 (exact date). Contraception: condoms sometimes, withdrawal.  Menopausal hormone therapy: None        OB History    Gravida  3   Para  3   Term  3   Preterm  0   AB  0   Living  3     SAB  0   TAB  0   Ectopic  0   Multiple  0   Live Births  3              There are no active problems to display for this patient.   History reviewed. No pertinent past medical history.  History reviewed. No pertinent surgical history.  No current outpatient medications on file.   No current facility-administered medications for this visit.      ALLERGIES: Patient has no known allergies.  Family History  Problem Relation Age of Onset  . Diabetes Mother   . Hyperlipidemia Mother   . Hypertension Mother   . Brain cancer Mother   . Uterine cancer Mother   . Stroke Father   . Diabetes Father   . Diabetes Brother     Social History   Socioeconomic History  . Marital status: Single    Spouse name: Not on file  . Number of children: Not on file  . Years of education: Not on file  . Highest education level: Not on file  Occupational History  . Not on file  Social Needs  . Financial resource strain: Not on file  . Food insecurity:    Worry: Not on file    Inability: Not on file  . Transportation needs:    Medical: Not on file    Non-medical: Not on file  Tobacco Use  . Smoking status: Never Smoker  . Smokeless tobacco: Never Used  Substance and Sexual Activity  . Alcohol use: No  . Drug use: No  . Sexual activity: Yes    Partners: Male     Birth control/protection: Condom, Coitus interruptus  Lifestyle  . Physical activity:    Days per week: Not on file    Minutes per session: Not on file  . Stress: Not on file  Relationships  . Social connections:    Talks on phone: Not on file    Gets together: Not on file    Attends religious service: Not on file    Active member of club or organization: Not on file    Attends meetings of clubs or organizations: Not on file    Relationship status: Not on file  . Intimate partner violence:    Fear of current or ex partner: Not on file    Emotionally abused: Not on file    Physically abused: Not on file    Forced sexual activity: Not on file  Other Topics Concern  . Not on file  Social History Narrative   Marital status: dating seriously x 9 years      Children: 3 children (67, 70,1)      Lives: with  boyfriend, 3 chi.ldren      Employment: unemployed      Tobacco: none      Alcohol: weekends/once per month      Drugs: none      Exercise:  Some; gym membership.      Sexual activity: no STDs.          Review of Systems  Constitutional: Negative.   HENT: Negative.   Eyes: Negative.   Respiratory: Negative.   Cardiovascular: Negative.   Gastrointestinal: Negative.   Genitourinary: Negative.   Musculoskeletal:       Right breast pain  Skin: Negative.   Neurological: Negative.   Endo/Heme/Allergies: Negative.   Psychiatric/Behavioral: Negative.   All other systems reviewed and are negative.   PHYSICAL EXAMINATION:    BP 100/60 (BP Location: Left Arm, Patient Position: Sitting, Cuff Size: Large)   Pulse 72   Resp 16   Ht 5' 3.5" (1.613 m)   Wt 172 lb (78 kg)   LMP 10/21/2017 (Exact Date)   BMI 29.99 kg/m     General appearance: alert, cooperative and appears stated age Breasts: in the right breast she has a large area of erythema from ~5-10 o'clock, in the areolar region is a 4 cm area of induration. Lymph: no significant axillary  adenopathy  ASSESSMENT Right mastitis, possible abscess    PLAN Set up breast ultrasound ASAP Start bactrim DS Use local heat Ibuprofen, tylenol and tramadol for pain (over the counter medication hasn't been enough help) CBC with diff    An After Visit Summary was printed and given to the patient.

## 2017-10-24 ENCOUNTER — Other Ambulatory Visit: Payer: Self-pay | Admitting: Obstetrics and Gynecology

## 2017-10-24 DIAGNOSIS — N644 Mastodynia: Secondary | ICD-10-CM

## 2017-10-24 LAB — CBC WITH DIFFERENTIAL/PLATELET
BASOS ABS: 0 10*3/uL (ref 0.0–0.2)
BASOS: 0 %
EOS (ABSOLUTE): 0.1 10*3/uL (ref 0.0–0.4)
Eos: 1 %
Hematocrit: 38.9 % (ref 34.0–46.6)
Hemoglobin: 13.3 g/dL (ref 11.1–15.9)
IMMATURE GRANS (ABS): 0 10*3/uL (ref 0.0–0.1)
IMMATURE GRANULOCYTES: 0 %
LYMPHS: 31 %
Lymphocytes Absolute: 2.6 10*3/uL (ref 0.7–3.1)
MCH: 28.4 pg (ref 26.6–33.0)
MCHC: 34.2 g/dL (ref 31.5–35.7)
MCV: 83 fL (ref 79–97)
Monocytes Absolute: 0.5 10*3/uL (ref 0.1–0.9)
Monocytes: 6 %
NEUTROS ABS: 5.4 10*3/uL (ref 1.4–7.0)
NEUTROS PCT: 62 %
PLATELETS: 291 10*3/uL (ref 150–450)
RBC: 4.69 x10E6/uL (ref 3.77–5.28)
RDW: 13.3 % (ref 12.3–15.4)
WBC: 8.6 10*3/uL (ref 3.4–10.8)

## 2017-10-25 ENCOUNTER — Encounter: Payer: Self-pay | Admitting: Obstetrics and Gynecology

## 2017-10-25 ENCOUNTER — Ambulatory Visit (INDEPENDENT_AMBULATORY_CARE_PROVIDER_SITE_OTHER): Payer: Self-pay | Admitting: Obstetrics and Gynecology

## 2017-10-25 VITALS — BP 120/66 | HR 72 | Resp 16 | Ht 63.5 in | Wt 173.2 lb

## 2017-10-25 DIAGNOSIS — N61 Mastitis without abscess: Secondary | ICD-10-CM

## 2017-10-25 DIAGNOSIS — L0292 Furuncle, unspecified: Secondary | ICD-10-CM

## 2017-10-25 NOTE — Progress Notes (Signed)
GYNECOLOGY  VISIT   HPI: 32 y.o.   Single Other or two or more races Hispanic or Latino  female   [phone] with Patient's last menstrual period was 10/21/2017 (exact date).   here for breast abscess follow up. She was seen 2 days ago and started on antibiotics and pain medication. She is feeling less pain and inflammation. Some fatigue and nausea, thinks from the tramadol. Redness is improving. Imaging from yesterday showed a 1.4 cm irregular area within the dermis of the right breast at 7 o'clock concerning for a small skin abscess.   GYNECOLOGIC HISTORY: Patient's last menstrual period was 10/21/2017 (exact date). Contraception: Condoms, sometimes withdrawal Menopausal hormone therapy: None        OB History    Gravida  3   Para  3   Term  3   Preterm  0   AB  0   Living  3     SAB  0   TAB  0   Ectopic  0   Multiple  0   Live Births  3              There are no active problems to display for this patient.   History reviewed. No pertinent past medical history.  History reviewed. No pertinent surgical history.  Current Outpatient Medications  Medication Sig Dispense Refill  . ibuprofen (ADVIL,MOTRIN) 800 MG tablet Take 1 tablet (800 mg total) by mouth every 8 (eight) hours as needed. 30 tablet 1  . sulfamethoxazole-trimethoprim (BACTRIM DS) 800-160 MG tablet Take 1 tablet by mouth 2 (two) times daily. One PO BID x 3 days 14 tablet 0  . traMADol (ULTRAM) 50 MG tablet Take 1 tablet (50 mg total) by mouth every 6 (six) hours as needed. 20 tablet 0   No current facility-administered medications for this visit.      ALLERGIES: Patient has no known allergies.  Family History  Problem Relation Age of Onset  . Diabetes Mother   . Hyperlipidemia Mother   . Hypertension Mother   . Brain cancer Mother   . Uterine cancer Mother   . Stroke Father   . Diabetes Father   . Diabetes Brother     Social History   Socioeconomic History  . Marital status: Single     Spouse name: Not on file  . Number of children: Not on file  . Years of education: Not on file  . Highest education level: Not on file  Occupational History  . Not on file  Social Needs  . Financial resource strain: Not on file  . Food insecurity:    Worry: Not on file    Inability: Not on file  . Transportation needs:    Medical: Not on file    Non-medical: Not on file  Tobacco Use  . Smoking status: Never Smoker  . Smokeless tobacco: Never Used  Substance and Sexual Activity  . Alcohol use: No  . Drug use: No  . Sexual activity: Yes    Partners: Male    Birth control/protection: Condom, Coitus interruptus  Lifestyle  . Physical activity:    Days per week: Not on file    Minutes per session: Not on file  . Stress: Not on file  Relationships  . Social connections:    Talks on phone: Not on file    Gets together: Not on file    Attends religious service: Not on file    Active member of club or organization:  Not on file    Attends meetings of clubs or organizations: Not on file    Relationship status: Not on file  . Intimate partner violence:    Fear of current or ex partner: Not on file    Emotionally abused: Not on file    Physically abused: Not on file    Forced sexual activity: Not on file  Other Topics Concern  . Not on file  Social History Narrative   Marital status: dating seriously x 9 years      Children: 3 children (9, 4,1)      Lives: with boyfriend, 3 chi.ldren      Employment: unemployed      Tobacco: none      Alcohol: weekends/once per month      Drugs: none      Exercise:  Some; gym membership.      Sexual activity: no STDs.          Review of Systems  All other systems reviewed and are negative.   PHYSICAL EXAMINATION:    BP 120/66 (BP Location: Right Arm, Patient Position: Sitting, Cuff Size: Normal)   Pulse 72   Resp 16   Ht 5' 3.5" (1.613 m)   Wt 173 lb 3.2 oz (78.6 kg)   LMP 10/21/2017 (Exact Date)   BMI 30.20 kg/m      General appearance: alert, cooperative and appears stated age Breasts: in the right breast she has a 1.5 cm bulge in the areolar region at 7 o'clock, total area of induration in 2 cm, down from 4 cm. Erythema is improving.  No significant axillary adenopathy.    ASSESSMENT Mastitis, focal skin abscess. Erythema is improving, induration is improving, appears to be coming to a head    PLAN Discussed options of continued antibiotics vs drainage. Given the improvement, continuing antibiotics is reasonable. Plan: continue antibiotics, warm compresses, hot soaks F/U on Monday, if not resolving then plan drainage Told her to only take the tramadol as needed   An After Visit Summary was printed and given to the patient.

## 2017-10-29 ENCOUNTER — Other Ambulatory Visit: Payer: Self-pay

## 2017-10-29 ENCOUNTER — Ambulatory Visit (INDEPENDENT_AMBULATORY_CARE_PROVIDER_SITE_OTHER): Payer: Self-pay | Admitting: Obstetrics & Gynecology

## 2017-10-29 ENCOUNTER — Encounter: Payer: Self-pay | Admitting: Obstetrics and Gynecology

## 2017-10-29 ENCOUNTER — Ambulatory Visit: Payer: Self-pay | Admitting: Obstetrics & Gynecology

## 2017-10-29 VITALS — BP 110/78 | HR 68 | Wt 172.0 lb

## 2017-10-29 DIAGNOSIS — N611 Abscess of the breast and nipple: Secondary | ICD-10-CM

## 2017-10-29 MED ORDER — SULFAMETHOXAZOLE-TRIMETHOPRIM 800-160 MG PO TABS: 1.0000 | ORAL_TABLET | Freq: Two times a day (BID) | ORAL | 0 refills | Status: DC

## 2017-10-29 MED ORDER — HYDROCODONE-ACETAMINOPHEN 5-325 MG PO TABS: 1.0000 | ORAL_TABLET | Freq: Four times a day (QID) | ORAL | 0 refills | Status: DC | PRN

## 2017-10-29 NOTE — Progress Notes (Signed)
GYNECOLOGY  VISIT   HPI: 32 y.o.   Single Hispanic female here for I&D of breast abscess.  She has been followed by Dr. Oscar La.  Was seen last week on 10/15 with right breast mastitis noted.  Area of erythema was 4cm with induration.  Possible abscess was noted.  She was started on bactrim DS BID and breast ultrasound was performed showing small area of possible abscess.  Recheck 2 days later was significant for 1.5cm bulge in areolar region at about 7 o'clock.  Area of induration was about 2cm.  Today, area of induration was about 6 cm with 4cm bulge.  It is tender to palpation.  She has not had any fever.  Tramadol given last week made her have nausea with emesis.  Motrin has not really helped the pain.  GYNECOLOGIC HISTORY: Patient's last menstrual period was 10/21/2017 (exact date). Contraception: condoms, sometimes withdrawal Menopausal hormone therapy: None        OB History    Gravida  3   Para  3   Term  3   Preterm  0   AB  0   Living  3     SAB  0   TAB  0   Ectopic  0   Multiple  0   Live Births  3              There are no active problems to display for this patient.   History reviewed. No pertinent past medical history.  History reviewed. No pertinent surgical history.  Current Outpatient Medications  Medication Sig Dispense Refill  . ibuprofen (ADVIL,MOTRIN) 800 MG tablet Take 1 tablet (800 mg total) by mouth every 8 (eight) hours as needed. 30 tablet 1  . sulfamethoxazole-trimethoprim (BACTRIM DS) 800-160 MG tablet Take 1 tablet by mouth 2 (two) times daily. One PO BID x 3 days 14 tablet 0  . traMADol (ULTRAM) 50 MG tablet Take 1 tablet (50 mg total) by mouth every 6 (six) hours as needed. 20 tablet 0   No current facility-administered medications for this visit.      ALLERGIES: Patient has no known allergies.  Family History  Problem Relation Age of Onset  . Diabetes Mother   . Hyperlipidemia Mother   . Hypertension Mother   . Brain  cancer Mother   . Uterine cancer Mother   . Stroke Father   . Diabetes Father   . Diabetes Brother     Social History   Socioeconomic History  . Marital status: Single    Spouse name: Not on file  . Number of children: Not on file  . Years of education: Not on file  . Highest education level: Not on file  Occupational History  . Not on file  Social Needs  . Financial resource strain: Not on file  . Food insecurity:    Worry: Not on file    Inability: Not on file  . Transportation needs:    Medical: Not on file    Non-medical: Not on file  Tobacco Use  . Smoking status: Never Smoker  . Smokeless tobacco: Never Used  Substance and Sexual Activity  . Alcohol use: No  . Drug use: No  . Sexual activity: Yes    Partners: Male    Birth control/protection: Condom, Coitus interruptus  Lifestyle  . Physical activity:    Days per week: Not on file    Minutes per session: Not on file  . Stress: Not on file  Relationships  . Social connections:    Talks on phone: Not on file    Gets together: Not on file    Attends religious service: Not on file    Active member of club or organization: Not on file    Attends meetings of clubs or organizations: Not on file    Relationship status: Not on file  . Intimate partner violence:    Fear of current or ex partner: Not on file    Emotionally abused: Not on file    Physically abused: Not on file    Forced sexual activity: Not on file  Other Topics Concern  . Not on file  Social History Narrative   Marital status: dating seriously x 9 years      Children: 3 children (9, 40,1)      Lives: with boyfriend, 3 chi.ldren      Employment: unemployed      Tobacco: none      Alcohol: weekends/once per month      Drugs: none      Exercise:  Some; gym membership.      Sexual activity: no STDs.          Review of Systems  Constitutional: Negative.   HENT: Negative.   Eyes: Negative.   Respiratory: Negative.   Cardiovascular:  Negative.   Gastrointestinal: Negative.   Genitourinary: Negative.   Musculoskeletal:       Breast pain Breast lump  Skin: Negative.   Neurological: Negative.   Endo/Heme/Allergies: Negative.   Psychiatric/Behavioral: Negative.     PHYSICAL EXAMINATION:    BP 110/78 (BP Location: Left Arm, Patient Position: Sitting)   Pulse 68   Wt 172 lb (78 kg)   LMP 10/21/2017 (Exact Date)   BMI 29.99 kg/m     Physical Exam  Constitutional: She appears well-developed and well-nourished.  Neck: Normal range of motion. Neck supple.  Respiratory: Right breast exhibits skin change and tenderness. Right breast exhibits no inverted nipple, no mass and no nipple discharge. Breasts are symmetrical.    Lymphadenopathy:    She has no cervical adenopathy.   Procedure:  I&D recommended.  Up to date recommendations also reviewed.  Area cleansed with Betadine x 3.  1.5cc 1% Licodaine instilled.  Using #11 blad3, 0.75cm incision made and significant purulent drainage noted.  Breast was milked to ensure all abscess was drained.  Dressing applied.  Pt tolerated procedure well.  Chaperone was present for exam.  ASSESSMENT Breast abscess, s/p I&D today   PLAN Continue Bactrim DS BID x 7 days.  Recheck in 2 days.  Continue TID Warm compresses. Vicodin 5/325 1 every six hours given.  #12/0RF.  Encouraged 800mg  motrin TID at this point, I feel will actually help.   An After Visit Summary was printed and given to the patient.

## 2017-10-29 NOTE — Addendum Note (Signed)
Addended by: Jerene Bears on: 10/29/2017 02:21 PM   Modules accepted: Orders

## 2017-10-30 NOTE — Progress Notes (Signed)
GYNECOLOGY  VISIT   HPI: 32 y.o.   Single Other or two or more races Hispanic or Latino  female   [phone] with Patient's last menstrual period was 10/21/2017 (exact date).   here for 2 day follow up. Patient had I&D of right breast abscess on 10/29/2017. She is feeling better, no longer draining or hurting. Erythema is getting better. She has antibiotics until next week.   GYNECOLOGIC HISTORY: Patient's last menstrual period was 10/21/2017 (exact date). Contraception:condoms, sometimes withdrawal Menopausal hormone therapy: None        OB History    Gravida  3   Para  3   Term  3   Preterm  0   AB  0   Living  3     SAB  0   TAB  0   Ectopic  0   Multiple  0   Live Births  3              There are no active problems to display for this patient.   History reviewed. No pertinent past medical history.  History reviewed. No pertinent surgical history.  Current Outpatient Medications  Medication Sig Dispense Refill  . HYDROcodone-acetaminophen (NORCO/VICODIN) 5-325 MG tablet Take 1-2 tablets by mouth every 6 (six) hours as needed for moderate pain. 12 tablet 0  . ibuprofen (ADVIL,MOTRIN) 800 MG tablet Take 1 tablet (800 mg total) by mouth every 8 (eight) hours as needed. 30 tablet 1  . sulfamethoxazole-trimethoprim (BACTRIM DS) 800-160 MG tablet Take 1 tablet by mouth 2 (two) times daily. 14 tablet 0   No current facility-administered medications for this visit.      ALLERGIES: Patient has no known allergies.  Family History  Problem Relation Age of Onset  . Diabetes Mother   . Hyperlipidemia Mother   . Hypertension Mother   . Brain cancer Mother   . Uterine cancer Mother   . Stroke Father   . Diabetes Father   . Diabetes Brother     Social History   Socioeconomic History  . Marital status: Single    Spouse name: Not on file  . Number of children: Not on file  . Years of education: Not on file  . Highest education level: Not on file   Occupational History  . Not on file  Social Needs  . Financial resource strain: Not on file  . Food insecurity:    Worry: Not on file    Inability: Not on file  . Transportation needs:    Medical: Not on file    Non-medical: Not on file  Tobacco Use  . Smoking status: Never Smoker  . Smokeless tobacco: Never Used  Substance and Sexual Activity  . Alcohol use: No  . Drug use: No  . Sexual activity: Yes    Partners: Male    Birth control/protection: Condom, Coitus interruptus  Lifestyle  . Physical activity:    Days per week: Not on file    Minutes per session: Not on file  . Stress: Not on file  Relationships  . Social connections:    Talks on phone: Not on file    Gets together: Not on file    Attends religious service: Not on file    Active member of club or organization: Not on file    Attends meetings of clubs or organizations: Not on file    Relationship status: Not on file  . Intimate partner violence:    Fear of current or  ex partner: Not on file    Emotionally abused: Not on file    Physically abused: Not on file    Forced sexual activity: Not on file  Other Topics Concern  . Not on file  Social History Narrative   Marital status: dating seriously x 9 years      Children: 3 children (9, 29,1)      Lives: with boyfriend, 3 chi.ldren      Employment: unemployed      Tobacco: none      Alcohol: weekends/once per month      Drugs: none      Exercise:  Some; gym membership.      Sexual activity: no STDs.          Review of Systems  Constitutional: Negative.   HENT: Negative.   Eyes: Negative.   Respiratory: Negative.   Cardiovascular: Negative.   Gastrointestinal: Negative.   Genitourinary: Negative.   Musculoskeletal: Negative.   Skin: Negative.   Neurological: Negative.   Endo/Heme/Allergies: Negative.   Psychiatric/Behavioral: Negative.     PHYSICAL EXAMINATION:    BP 110/70 (BP Location: Right Arm, Patient Position: Sitting, Cuff Size:  Normal)   Pulse 64   Wt 170 lb 12.8 oz (77.5 kg)   LMP 10/21/2017 (Exact Date)   BMI 29.78 kg/m     General appearance: alert, cooperative and appears stated age Breasts: the right breast is markedly improved, now with minimal erythema and induration, c/w healing. Incision is healing well, no drainage.   ASSESSMENT Breast abscess, resolving s/p I&D, still on antibiotics. Doing well    PLAN F/U next week, due for annual exam  Finish antibiotics Call with any concerns   An After Visit Summary was printed and given to the patient.  Interpreter Viviana  ID [id number]

## 2017-10-31 ENCOUNTER — Encounter: Payer: Self-pay | Admitting: Obstetrics and Gynecology

## 2017-10-31 ENCOUNTER — Other Ambulatory Visit: Payer: Self-pay

## 2017-10-31 ENCOUNTER — Ambulatory Visit (INDEPENDENT_AMBULATORY_CARE_PROVIDER_SITE_OTHER): Payer: Self-pay | Admitting: Obstetrics and Gynecology

## 2017-10-31 VITALS — BP 110/70 | HR 64 | Wt 170.8 lb

## 2017-10-31 DIAGNOSIS — N611 Abscess of the breast and nipple: Secondary | ICD-10-CM

## 2017-11-01 ENCOUNTER — Telehealth: Payer: Self-pay | Admitting: Emergency Medicine

## 2017-11-01 LAB — WOUND CULTURE

## 2017-11-01 NOTE — Progress Notes (Signed)
32 y.o. [id number] Single Other or two or more races Hispanic or Latino female here for annual exam.   Her breast abscess was drained last week. Feels much better, has mild itching in the area of the abscess. No vaginal itching.  Period Cycle (Days): 28 Period Duration (Days): 5-7 Period Pattern: Regular Menstrual Flow: Heavy Menstrual Control: Tampon, Maxi pad Menstrual Control Change Freq (Hours): 3 Dysmenorrhea: (!) Mild Dysmenorrhea Symptoms: Other (Comment)(fatigue, leg cramps )  Happy with condoms. Can't remember pills, didn't like the nuvaring, cramps with the IUD.   Patient's last menstrual period was 10/21/2017 (exact date).          Sexually active: Yes.    The current method of family planning is condoms sometimes.    Exercising: Yes.    some Smoker:  no  Health Maintenance: Pap: 06/23/2016 negative, 12/18/2014 negative History of abnormal Pap:  no MMG:  10/24/2017 right breast ultrasound  Colonoscopy: Never TDaP:  09/26/2010 Gardasil: n/a   reports that she has never smoked. She has never used smokeless tobacco. She reports that she does not drink alcohol or use drugs.   History reviewed. No pertinent past medical history.  History reviewed. No pertinent surgical history.  Current Outpatient Medications  Medication Sig Dispense Refill  . ibuprofen (ADVIL,MOTRIN) 800 MG tablet Take 1 tablet (800 mg total) by mouth every 8 (eight) hours as needed. 30 tablet 1  . sulfamethoxazole-trimethoprim (BACTRIM DS) 800-160 MG tablet Take 1 tablet by mouth 2 (two) times daily. 14 tablet 0   No current facility-administered medications for this visit.     Family History  Problem Relation Age of Onset  . Diabetes Mother   . Hyperlipidemia Mother   . Hypertension Mother   . Brain cancer Mother   . Uterine cancer Mother   . Stroke Father   . Diabetes Father   . Diabetes Brother     Review of Systems  Constitutional: Negative.   HENT: Negative.   Eyes: Negative.    Respiratory: Negative.   Cardiovascular: Negative.   Gastrointestinal: Negative.   Endocrine: Negative.   Genitourinary: Negative.   Musculoskeletal: Negative.   Skin: Negative.   Allergic/Immunologic: Negative.   Neurological: Negative.   Hematological: Negative.   Psychiatric/Behavioral: Negative.   All other systems reviewed and are negative.   Exam:   BP 102/80 (BP Location: Right Arm, Patient Position: Sitting, Cuff Size: Large)   Pulse 60   Resp 16   Ht 5\' 4"  (1.626 m)   Wt 171 lb (77.6 kg)   LMP 10/21/2017 (Exact Date)   BMI 29.35 kg/m   Weight change: @WEIGHTCHANGE @ Height:   Height: 5\' 4"  (162.6 cm)  Ht Readings from Last 3 Encounters:  11/05/17 5\' 4"  (1.626 m)  10/25/17 5' 3.5" (1.613 m)  10/23/17 5' 3.5" (1.613 m)    General appearance: alert, cooperative and appears stated age Head: Normocephalic, without obvious abnormality, atraumatic Neck: no adenopathy, supple, symmetrical, trachea midline and thyroid normal to inspection and palpation Lungs: clear to auscultation bilaterally Cardiovascular: regular rate and rhythm Breasts: continuing to heal from right breast abscess, minimal erythema, incision and induration are healing. No Lumps Abdomen: soft, non-tender; non distended,  no masses,  no organomegaly Extremities: extremities normal, atraumatic, no cyanosis or edema Skin: Skin color, texture, turgor normal. No rashes or lesions Lymph nodes: Cervical, supraclavicular, and axillary nodes normal. No abnormal inguinal nodes palpated Neurologic: Grossly normal   Pelvic: External genitalia:  no lesions  Urethra:  normal appearing urethra with no masses, tenderness or lesions              Bartholins and Skenes: normal                 Vagina: normal appearing vagina with normal color and discharge, no lesions              Cervix: no lesions               Bimanual Exam:  Uterus:  normal size, contour, position, consistency, mobility, non-tender               Adnexa: no mass, fullness, tenderness               Rectovaginal: Confirms               Anus:  normal sphincter tone, no lesions  Chaperone was present for exam.  A:  Well Woman with normal exam  Breast self exam  P:   No pap this year  Labs with primary MD  Condoms for contraception  Discussed breast self exam  Discussed calcium and vit D intake

## 2017-11-01 NOTE — Telephone Encounter (Signed)
Called pt with the interpreter services. LVM for pt to call the office and reschedule her appt on 11/06/17 with Dr. Alvy Bimler. She will need to reschedule to either 11-05-17 or 11-07-17.  OK to you same day appt.   When pt calls back, please call [place] and we will open up a same day.   Thank you!

## 2017-11-05 ENCOUNTER — Ambulatory Visit (INDEPENDENT_AMBULATORY_CARE_PROVIDER_SITE_OTHER): Payer: Self-pay | Admitting: Obstetrics and Gynecology

## 2017-11-05 ENCOUNTER — Encounter: Payer: Self-pay | Admitting: Obstetrics and Gynecology

## 2017-11-05 ENCOUNTER — Other Ambulatory Visit: Payer: Self-pay

## 2017-11-05 VITALS — BP 102/80 | HR 60 | Resp 16 | Ht 64.0 in | Wt 171.0 lb

## 2017-11-05 DIAGNOSIS — Z01419 Encounter for gynecological examination (general) (routine) without abnormal findings: Secondary | ICD-10-CM

## 2017-11-05 DIAGNOSIS — N611 Abscess of the breast and nipple: Secondary | ICD-10-CM

## 2017-11-05 NOTE — Patient Instructions (Signed)

## 2017-11-06 ENCOUNTER — Encounter: Payer: Self-pay | Admitting: Emergency Medicine

## 2017-12-26 ENCOUNTER — Encounter: Payer: Self-pay | Admitting: Obstetrics and Gynecology

## 2017-12-26 DIAGNOSIS — N644 Mastodynia: Secondary | ICD-10-CM

## 2018-07-03 ENCOUNTER — Ambulatory Visit: Payer: Self-pay | Admitting: Family Medicine

## 2018-07-03 ENCOUNTER — Other Ambulatory Visit: Payer: Self-pay

## 2018-07-03 ENCOUNTER — Encounter: Payer: Self-pay | Admitting: Emergency Medicine

## 2018-07-03 ENCOUNTER — Ambulatory Visit (INDEPENDENT_AMBULATORY_CARE_PROVIDER_SITE_OTHER): Payer: Self-pay | Admitting: Emergency Medicine

## 2018-07-03 VITALS — BP 113/69 | HR 68 | Temp 98.3°F | Resp 16 | Ht 66.0 in | Wt 182.2 lb

## 2018-07-03 DIAGNOSIS — R102 Pelvic and perineal pain: Secondary | ICD-10-CM

## 2018-07-03 DIAGNOSIS — N912 Amenorrhea, unspecified: Secondary | ICD-10-CM

## 2018-07-03 DIAGNOSIS — Z3491 Encounter for supervision of normal pregnancy, unspecified, first trimester: Secondary | ICD-10-CM

## 2018-07-03 LAB — POCT URINALYSIS DIP (MANUAL ENTRY)
Bilirubin, UA: NEGATIVE
Blood, UA: NEGATIVE
Glucose, UA: NEGATIVE mg/dL
Leukocytes, UA: NEGATIVE
Nitrite, UA: NEGATIVE
Protein Ur, POC: NEGATIVE mg/dL
Spec Grav, UA: 1.02 (ref 1.010–1.025)
Urobilinogen, UA: 0.2 E.U./dL
pH, UA: 8.5 — AB (ref 5.0–8.0)

## 2018-07-03 NOTE — Progress Notes (Signed)
Laura Brady 33 y.o.   Chief Complaint  Patient presents with   Possible Pregnancy    per patient first of last period 05/24/2018    HISTORY OF PRESENT ILLNESS: This is a 33 y.o. female complaining of suprapubic pain and low back pain for several days.  Urine home pregnancy test positive x4.  Patient requesting blood pregnancy test.  Concerned she may have a UTI.  No history of ectopic pregnancies.  History of 3 normal pregnancies.  Denies abnormal vaginal bleeding.  No other significant symptoms.  HPI   Prior to Admission medications   Medication Sig Start Date End Date Taking? Authorizing Provider  ibuprofen (ADVIL,MOTRIN) 800 MG tablet Take 1 tablet (800 mg total) by mouth every 8 (eight) hours as needed. Patient not taking: Reported on 07/03/2018 10/23/17   Laura Dom, MD  sulfamethoxazole-trimethoprim (BACTRIM DS) 800-160 MG tablet Take 1 tablet by mouth 2 (two) times daily. Patient not taking: Reported on 07/03/2018 10/29/17   Laura Salon, MD    No Known Allergies  There are no active problems to display for this patient.   No past medical history on file.  No past surgical history on file.  Social History   Socioeconomic History   Marital status: Single    Spouse name: Not on file   Number of children: Not on file   Years of education: Not on file   Highest education level: Not on file  Occupational History   Not on file  Social Needs   Financial resource strain: Not on file   Food insecurity    Worry: Not on file    Inability: Not on file   Transportation needs    Medical: Not on file    Non-medical: Not on file  Tobacco Use   Smoking status: Never Smoker   Smokeless tobacco: Never Used  Substance and Sexual Activity   Alcohol use: No   Drug use: No   Sexual activity: Yes    Partners: Male    Birth control/protection: Condom, Coitus interruptus  Lifestyle   Physical activity    Days per week: Not on file   Minutes per session: Not on file   Stress: Not on file  Relationships   Social connections    Talks on phone: Not on file    Gets together: Not on file    Attends religious service: Not on file    Active member of club or organization: Not on file    Attends meetings of clubs or organizations: Not on file    Relationship status: Not on file   Intimate partner violence    Fear of current or ex partner: Not on file    Emotionally abused: Not on file    Physically abused: Not on file    Forced sexual activity: Not on file  Other Topics Concern   Not on file  Social History Narrative   Marital status: dating seriously x 9 years      Children: 3 children (63, 78,1)      Lives: with boyfriend, 3 chi.ldren      Employment: unemployed      Tobacco: none      Alcohol: weekends/once per month      Drugs: none      Exercise:  Some; Building services engineer.      Sexual activity: no STDs.          Family History  Problem Relation Age of Onset   Diabetes Mother  Hyperlipidemia Mother    Hypertension Mother    Brain cancer Mother    Uterine cancer Mother    Stroke Father    Diabetes Father    Diabetes Brother      Review of Systems  Constitutional: Negative.  Negative for chills and fever.  HENT: Negative.  Negative for congestion, nosebleeds and sore throat.   Respiratory: Negative for cough and shortness of breath.   Cardiovascular: Negative for chest pain and palpitations.  Gastrointestinal: Positive for abdominal pain (Suprapubic) and nausea. Negative for diarrhea and vomiting.  Genitourinary: Negative for dysuria.  Musculoskeletal: Positive for back pain.  Skin: Negative.  Negative for rash.  Neurological: Negative for dizziness and headaches.  All other systems reviewed and are negative.   Vitals:   07/03/18 1050  BP: 113/69  Pulse: 68  Resp: 16  Temp: 98.3 F (36.8 C)  SpO2: 99%    Physical Exam Vitals signs reviewed.  Constitutional:      Appearance:  Normal appearance.  HENT:     Head: Normocephalic and atraumatic.  Eyes:     Extraocular Movements: Extraocular movements intact.     Pupils: Pupils are equal, round, and reactive to light.  Neck:     Musculoskeletal: Normal range of motion.  Cardiovascular:     Rate and Rhythm: Normal rate and regular rhythm.     Heart sounds: Normal heart sounds.  Pulmonary:     Breath sounds: Normal breath sounds.  Abdominal:     Palpations: Abdomen is soft.     Tenderness: There is no abdominal tenderness. There is no right CVA tenderness or left CVA tenderness.  Musculoskeletal: Normal range of motion.  Skin:    General: Skin is warm and dry.     Capillary Refill: Capillary refill takes less than 2 seconds.  Neurological:     General: No focal deficit present.     Mental Status: She is alert and oriented to person, place, and time.  Psychiatric:        Mood and Affect: Mood normal.        Behavior: Behavior normal.    Results for orders placed or performed in visit on 07/03/18 (from the past 24 hour(s))  POCT urinalysis dipstick     Status: Abnormal   Collection Time: 07/03/18 12:20 PM  Result Value Ref Range   Color, UA yellow yellow   Clarity, UA clear clear   Glucose, UA negative negative mg/dL   Bilirubin, UA negative negative   Ketones, POC UA small (15) (A) negative mg/dL   Spec Grav, UA [id number] 1.3081.010 - 1.025   Blood, UA negative negative   pH, UA 8.5 (A) 5.0 - 8.0   Protein Ur, POC negative negative mg/dL   Urobilinogen, UA 0.2 0.2 or 1.0 E.U./dL   Nitrite, UA Negative Negative   Leukocytes, UA Negative Negative     ASSESSMENT & PLAN: Laura Brady was seen today for possible pregnancy.  Diagnoses and all orders for this visit:  Amenorrhea -     Cancel: hCG, quantitative, pregnancy -     hCG, serum, qualitative  Suprapubic pain -     POCT urinalysis dipstick  First trimester pregnancy    Patient Instructions       If you have lab work done today you will be  contacted with your lab results within the next 2 weeks.  If you have not heard from us then please contact us. The fastest way to get your results is to register  for My Chart.   IF you received an x-ray today, you will receive an invoice from [facility]. Please contact [facility] at [phone] with questions or concerns regarding your invoice.   IF you received labwork today, you will receive an invoice from [facility]. Please contact [facility] at [phone] with questions or concerns regarding your invoice.   Our billing staff will not be able to assist you with questions regarding bills from these companies.  You will be contacted with the lab results as soon as they are available. The fastest way to get your results is to activate your My Chart account. Instructions are located on the last page of this paperwork. If you have not heard from [place] regarding the results in 2 weeks, please contact this office.     Primer trimestre de Psychiatrist First Trimester of Pregnancy  El primer trimestre de Psychiatrist se extiende desde la semana1 hasta el final de la semana13 (mes1 al mes3). Durante este tiempo, el beb comenzar a desarrollarse dentro suyo. Entre la semana6 y [place], se forman los ojos y Recruitment consultant, y los latidos del corazn pueden escucharse en la ecografa. Al final de las 12semanas, todos los rganos del beb estn formados. El cuidado prenatal es toda la asistencia mdica que usted recibe antes del nacimiento del beb. Asegrese de recibir un buen cuidado prenatal y de seguir todas las indicaciones del mdico. Siga estas indicaciones en su casa: Medicamentos  Tome los medicamentos de venta libre y los recetados solamente como se lo haya indicado el mdico. Algunos medicamentos son seguros para tomar durante el Psychiatrist y otros no lo son.  Tome vitaminas prenatales que contengan por lo menos (?g) de cido flico.  Si tiene problemas para defecar  (estreimiento), tome un medicamento que ablanda la materia fecal (laxante), siempre que lo autorice el mdico. Comida y bebida   Ingiera alimentos saludables de [place] regular.  El Firefighter la cantidad de peso que [place].  No coma carne cruda ni quesos sin cocinar.  Si tiene Programme researcher, broadcasting/film/video (nuseas) o vomita (vmitos): ? Ingiera 4 o 5comidas pequeas por Geophysical data processor de 3abundantes. ? Intente comer algunas galletitas saladas. ? Beba lquidos Altria Group, en lugar de [employer] Scientific.  Para evitar el estreimiento: ? Consuma alimentos ricos en fibra, como frutas y verduras frescas, cereales integrales y legumbres. ? Beba suficiente lquido para mantener el pis (orina) claro o de color amarillo plido. Actividad  Haga ejercicios solamente como se lo haya indicado el mdico. Deje de hacer ejercicios si tiene clicos o dolor en la parte baja del vientre (abdomen) o en la cintura.  No haga actividad fsica si el clima est demasiado caluroso o hmedo, o si se encuentra en un lugar muy alto (altitud elevada).  Intente no estar de pie FedEx. Mueva las piernas con frecuencia si debe estar de pie en un lugar durante mucho tiempo.  Evite levantar pesos [employer] Brands.  Use zapatos con tacones bajos. Mantenga una buena postura al sentarse y pararse.  Puede tener [employer] [place] Travelers, a menos que el mdico le indique lo contrario. Alivio del dolor y del Dentist  Use un sostn que le brinde buen soporte si le duelen las [place].  Dese baos de asiento con agua tibia para Engineer, materials o las molestias causadas por las hemorroides. Use una crema antihemorroidal si el mdico se lo permite.  Descanse con las piernas elevadas si tiene calambres o dolor de cintura.  Si  tiene las venas de las piernas hinchadas y abultadas (venas varicosas): ? Use medias elsticas de soporte o medias de compresin como se lo haya indicado el mdico. ? Levante (eleve)  los pies durante 15minutos, 3 o 4veces por Futures traderda. ? Limite la sal en sus alimentos. Cuidado prenatal  Programe las visitas prenatales para la semana12 de [place].  Escriba sus preguntas. Llvelas cuando concurra a las visitas prenatales.  Concurra a todas las visitas prenatales como se lo haya indicado el mdico. Esto es importante. Seguridad  Use el cinturn de seguridad en todo momento mientras conduce.  Haga una lista con los nmeros de telfono en caso de Associate Professoremergencia. Esta lista debe incluir los nmeros de los familiares, los amigos, el hospital y los departamentos de polica y de bomberos. Instrucciones generales  Pdale al mdico que la derive a clases prenatales en su localidad. Debe comenzar a tomar las clases antes de Cytogeneticistentrar en el mes6 de embarazo.  Pida ayuda si necesita asesoramiento o asistencia con la alimentacin. El mdico puede aconsejarla o indicarle dnde recurrir para recibir [place]uda.  No se d baos de inmersin en agua caliente, baos turcos ni saunas.  No se haga duchas vaginales ni use tampones o toallas higinicas perfumadas.  No mantenga las piernas cruzadas durante [place] tiempo.  Evite las hierbas y el alcohol. Evite los frmacos que el mdico no haya autorizado.  No consuma ningn producto que contenga tabaco, lo que incluye cigarrillos, tabaco de Theatre managermascar o Administrator, Civil Servicecigarrillos electrnicos. Si necesita ayuda para dejar de fumar, consulte al American Expressmdico. Puede recibir asesoramiento u otro tipo de apoyo para dejar de fumar.  Evite el contacto con las bandejas sanitarias de los gatos y la tierra que estos animales usan. Estos elementos contienen grmenes que pueden causar defectos congnitos al beb y la posible prdida del feto (aborto espontneo) o muerte fetal.  Visite al dentista. En su casa, lvese los dientes con un cepillo dental suave. Psese el hilo dental con suavidad. Comunquese con un mdico si:  Tiene mareos.  Tiene clicos leves o siente presin en la  parte baja del vientre.  Sufre un dolor persistente en el abdomen.  Sigue teniendo AT&Tmalestar estomacal, vomita o la materia fecal es lquida (diarrea).  Nota una secrecin de lquido con olor ftido que proviene de la vagina.  Tiene dolor al hacer pis (orinar).  Tiene el rostro, las [place]af[place]os, las piernas o los tobillos ms hinchados (inflamados). Solicite ayuda de inmediato si:  Tiene fiebre.  Tiene una prdida de lquido por la vagina.  Tiene sangrado o pequeas prdidas vaginales.  Tiene clicos o dolor muy intensos en el vientre.  Sube o baja de peso rpidamente.  Vomita sangre. Esto tiene Art gallery managerun aspecto similar a la borra del caf.  Est en contacto con personas que tienen rubola, la quinta enfermedad o varicela.  Siente un dolor de cabeza muy intenso.  Le falta el aire.  Sufre cualquier tipo de traumatismo, por ejemplo, debido a una cada o un accidente automovilstico. Resumen  El primer trimestre de Psychiatristembarazo se extiende desde la semana1 hasta el final de la semana13 (mes1 al mes3).  Para cuidar su salud y la del beb en gestacin, necesitar consumir alimentos saludables, tomar medicamentos solamente si lo autoriza el mdico, y Radio producerhacer actividades que sean seguras para usted y para su beb.  Concurra a todas las visitas de control como se lo haya indicado el mdico. Esto es importante porque el mdico deber asegurar que el beb est saludable y est creciendo  bien. Esta informacin no tiene Theme park managercomo fin reemplazar el consejo del mdico. Asegrese de hacerle al mdico cualquier pregunta que tenga. Document Released: 03/24/2008 Document Revised: 08/01/2016 Document Reviewed: 08/01/2016 Elsevier Interactive Patient Education  2019 Elsevier Inc.      Edwina BarthMiguel Talma Aguillard, MD [facility]

## 2018-07-03 NOTE — Patient Instructions (Addendum)
If you have lab work done today you will be contacted with your lab results within the next 2 weeks.  If you have not heard from [place] then please contact us. The fastest way to get your results is to register for My Chart.   IF you received an x-ray today, you will receive an invoice from [facility]. Please contact [facility] at [phone] with questions or concerns regarding your invoice.   IF you received labwork today, you will receive an invoice from [place]. Please contact [facility] at [phone] with questions or concerns regarding your invoice.   Our billing staff will not be able to assist you with questions regarding bills from these companies.  You will be contacted with the lab results as soon as they are available. The fastest way to get your results is to activate your My Chart account. Instructions are located on the last page of this paperwork. If you have not heard from [place] regarding the results in 2 weeks, please contact this office.     Primer trimestre de Media planner First Trimester of Pregnancy  El primer trimestre de Media planner se extiende desde la semana1 hasta el final de la semana13 (mes1 al mes3). Durante este tiempo, el beb comenzar a desarrollarse dentro suyo. Entre la semana6 y [place], se forman los ojos y Financial risk analyst, y los latidos del corazn pueden escucharse en la ecografa. Al final de las 12semanas, todos los rganos del beb estn formados. El cuidado prenatal es toda la asistencia mdica que usted recibe antes del nacimiento del beb. Asegrese de recibir un buen cuidado prenatal y de seguir todas las indicaciones del mdico. Siga estas indicaciones en su casa: [place] los medicamentos de venta libre y los recetados solamente como se lo haya indicado el mdico. Algunos medicamentos son seguros para tomar durante el Media planner y otros no lo son.  Tome vitaminas prenatales que contengan por lo menos [id number] (?g) de  cido flico.  Si tiene problemas para defecar (estreimiento), tome un medicamento que ablanda la materia fecal (laxante), siempre que lo autorice el mdico. Comida y bebida   Ingiera alimentos saludables de [place] regular.  El Buyer, retail la cantidad de peso que [place].  No coma carne cruda ni quesos sin cocinar.  Si tiene Higher education careers adviser (nuseas) o vomita (vmitos): ? Ingiera 4 o 5comidas pequeas por TEFL teacher de 3abundantes. ? Intente comer algunas galletitas saladas. ? Beba lquidos DTE Energy Company, en lugar de Frontier Oil Corporation.  Para evitar el estreimiento: ? Consuma alimentos ricos en fibra, como frutas y verduras frescas, cereales integrales y legumbres. ? Beba suficiente lquido para mantener el pis (orina) claro o de color amarillo plido. Actividad  Haga ejercicios solamente como se lo haya indicado el mdico. Deje de hacer ejercicios si tiene clicos o dolor en la parte baja del vientre (abdomen) o en la cintura.  No haga actividad fsica si el clima est demasiado caluroso o hmedo, o si se encuentra en un lugar muy alto (altitud elevada).  Intente no estar de pie Tech Data Corporation. Mueva las piernas con frecuencia si debe estar de pie en un lugar durante mucho tiempo.  Evite levantar pesos EMCOR.  Use zapatos con tacones bajos. Mantenga una buena postura al sentarse y pararse.  Puede tener [employer]ot, a menos que el mdico le indique lo contrario. Alivio del dolor y del Tree surgeon  Use un sostn que le brinde buen soporte si le duelen las  mamas.  Dese baos de asiento con agua tibia para Best boy o las molestias causadas por las hemorroides. Use una crema antihemorroidal si el mdico se lo permite.  Descanse con las piernas elevadas si tiene calambres o dolor de cintura.  Si tiene las venas de las piernas hinchadas y abultadas (venas varicosas): ? Use medias elsticas de soporte o medias de compresin como  se lo haya indicado el mdico. ? Levante (eleve) los pies durante 77minutos, 3 o 4veces por Training and development officer. ? Limite la sal en sus alimentos. Cuidado prenatal  Programe las visitas prenatales para la semana12 de [place].  Escriba sus preguntas. Llvelas cuando concurra a las visitas prenatales.  Concurra a todas las visitas prenatales como se lo haya indicado el mdico. Esto es importante. Seguridad  Use el cinturn de seguridad en todo momento mientras conduce.  Haga una lista con los nmeros de telfono en caso de Freight forwarder. Esta lista debe incluir los nmeros de los familiares, los amigos, el hospital y los departamentos de polica y de bomberos. Instrucciones generales  Pdale al mdico que la derive a clases prenatales en su localidad. Debe comenzar a tomar las clases antes de Dietitian en el mes6 de embarazo.  Pida ayuda si necesita asesoramiento o asistencia con la alimentacin. El mdico puede aconsejarla o indicarle dnde recurrir para recibir [place].  No se d baos de inmersin en agua caliente, baos turcos ni saunas.  No se haga duchas vaginales ni use tampones o toallas higinicas perfumadas.  No mantenga las piernas cruzadas durante mucho tiempo.  Evite las hierbas y el alcohol. Evite los frmacos que el mdico no haya autorizado.  No consuma ningn producto que contenga tabaco, lo que incluye cigarrillos, tabaco de Higher education careers adviser o Psychologist, sport and exercise. Si necesita ayuda para dejar de fumar, consulte al [employer]aco. Puede recibir asesoramiento u otro tipo de apoyo para dejar de fumar.  Evite el contacto con las bandejas sanitarias de los gatos y la tierra que estos animales usan. Estos elementos contienen grmenes que pueden causar defectos congnitos al beb y la posible prdida del feto (aborto espontneo) o muerte fetal.  Visite al dentista. En su casa, lvese los dientes con un cepillo dental suave. Psese el hilo dental con suavidad. Comunquese con un mdico si:  Tiene  mareos.  Tiene clicos leves o siente presin en la parte baja del vientre.  Sufre un dolor persistente en el abdomen.  Sigue teniendo [employer] Life Insurance, vomita o la materia fecal es lquida (diarrea).  Nota una secrecin de lquido con olor ftido que proviene de la vagina.  Tiene dolor al hacer pis (orinar).  Tiene el rostro, las [place], las piernas o los tobillos ms hinchados (inflamados). Solicite ayuda de inmediato si:  Tiene fiebre.  Tiene una prdida de lquido por la vagina.  Tiene sangrado o pequeas prdidas vaginales.  Tiene clicos o dolor muy intensos en el vientre.  Sube o baja de peso rpidamente.  Vomita sangre. Esto tiene Chiropodist similar a la borra del caf.  Est en contacto con personas que tienen rubola, la quinta enfermedad o varicela.  Siente un dolor de cabeza muy intenso.  Le falta el aire.  Sufre cualquier tipo de traumatismo, por ejemplo, debido a una cada o un accidente automovilstico. Resumen  El primer trimestre de Media planner se extiende desde la semana1 hasta el final de la semana13 (mes1 al mes3).  Para cuidar su salud y la del beb en gestacin, necesitar consumir alimentos saludables, tomar medicamentos solamente si lo autoriza  el mdico, y hacer actividades que sean seguras para usted y para su beb.  Concurra a todas las visitas de control como se lo haya indicado el mdico. Esto es importante porque el mdico deber asegurar que el beb est saludable y est creciendo bien. Esta informacin no tiene Theme park managercomo fin reemplazar el consejo del mdico. Asegrese de hacerle al mdico cualquier pregunta que tenga. Document Released: 03/24/2008 Document Revised: 08/01/2016 Document Reviewed: 08/01/2016 Elsevier Interactive Patient Education  2019 ArvinMeritorElsevier Inc.

## 2018-07-04 ENCOUNTER — Telehealth: Payer: Self-pay | Admitting: Emergency Medicine

## 2018-07-04 LAB — HCG, SERUM, QUALITATIVE: hCG,Beta Subunit,Qual,Serum: POSITIVE m[IU]/mL — AB (ref <6)

## 2018-07-04 NOTE — Telephone Encounter (Signed)
Spoke to patient regarding serum pregnancy test.

## 2018-08-02 ENCOUNTER — Other Ambulatory Visit: Payer: Self-pay

## 2018-08-02 ENCOUNTER — Encounter (HOSPITAL_COMMUNITY): Payer: Self-pay | Admitting: *Deleted

## 2018-08-02 ENCOUNTER — Inpatient Hospital Stay (HOSPITAL_COMMUNITY): Payer: Self-pay

## 2018-08-02 ENCOUNTER — Inpatient Hospital Stay (HOSPITAL_COMMUNITY)
Admission: AD | Admit: 2018-08-02 | Discharge: 2018-08-02 | Disposition: A | Discharge status: Home / Self Care | Payer: Self-pay | Attending: Obstetrics & Gynecology | Admitting: Obstetrics & Gynecology

## 2018-08-02 DIAGNOSIS — Z3490 Encounter for supervision of normal pregnancy, unspecified, unspecified trimester: Secondary | ICD-10-CM

## 2018-08-02 DIAGNOSIS — Z3A10 10 weeks gestation of pregnancy: Secondary | ICD-10-CM | POA: Insufficient documentation

## 2018-08-02 DIAGNOSIS — O209 Hemorrhage in early pregnancy, unspecified: Secondary | ICD-10-CM

## 2018-08-02 DIAGNOSIS — O26891 Other specified pregnancy related conditions, first trimester: Secondary | ICD-10-CM | POA: Diagnosis present

## 2018-08-02 DIAGNOSIS — R109 Unspecified abdominal pain: Secondary | ICD-10-CM | POA: Insufficient documentation

## 2018-08-02 DIAGNOSIS — O3680X0 Pregnancy with inconclusive fetal viability, not applicable or unspecified: Secondary | ICD-10-CM

## 2018-08-02 DIAGNOSIS — O200 Threatened abortion: Secondary | ICD-10-CM | POA: Diagnosis present

## 2018-08-02 DIAGNOSIS — O26851 Spotting complicating pregnancy, first trimester: Secondary | ICD-10-CM | POA: Diagnosis present

## 2018-08-02 HISTORY — DX: Other specified health status: Z78.9

## 2018-08-02 LAB — URINALYSIS, ROUTINE W REFLEX MICROSCOPIC
Bacteria, UA: NONE SEEN
Bilirubin Urine: NEGATIVE
Glucose, UA: NEGATIVE mg/dL
Ketones, ur: NEGATIVE mg/dL
Leukocytes,Ua: NEGATIVE
Nitrite: NEGATIVE
Protein, ur: NEGATIVE mg/dL
Specific Gravity, Urine: 1.002 — ABNORMAL LOW (ref 1.005–1.030)
pH: 7 (ref 5.0–8.0)

## 2018-08-02 LAB — WET PREP, GENITAL
Clue Cells Wet Prep HPF POC: NONE SEEN
Sperm: NONE SEEN
Trich, Wet Prep: NONE SEEN
Yeast Wet Prep HPF POC: NONE SEEN

## 2018-08-02 LAB — CBC
HCT: 38 % (ref 36.0–46.0)
Hemoglobin: 12.7 g/dL (ref 12.0–15.0)
MCH: 28.6 pg (ref 26.0–34.0)
MCHC: 33.4 g/dL (ref 30.0–36.0)
MCV: 85.6 fL (ref 80.0–100.0)
Platelets: 271 10*3/uL (ref 150–400)
RBC: 4.44 MIL/uL (ref 3.87–5.11)
RDW: 13.1 % (ref 11.5–15.5)
WBC: 6.3 10*3/uL (ref 4.0–10.5)
nRBC: 0 % (ref 0.0–0.2)

## 2018-08-02 LAB — HCG, QUANTITATIVE, PREGNANCY: hCG, Beta Chain, Quant, S: 19537 m[IU]/mL — ABNORMAL HIGH (ref <5)

## 2018-08-02 NOTE — Discharge Instructions (Signed)
Puede tomar Tylenol 1000 mg por via oral durante 6 horas segun sea necesario para el dolor. Alguien del departmento de ultrasonido lo llamara para programar una cita para repetir el ultrasonido en 2 semanas.

## 2018-08-02 NOTE — MAU Note (Signed)
Pt reports  She had some bleeding last week just a small amount. Last night she had more bleeding and some pain. Not bleeding that much today but still feeling abd pain.  Pain was worse last night.

## 2018-08-02 NOTE — MAU Provider Note (Addendum)
History     CSN: [id number]  Arrival date and time: 08/02/18 1247   First Provider Initiated Contact with Patient 08/02/18 1324 -- HPI, exam, lab/US results, POC, and d/c instructions discussed with assistance from in-person Spanish interpreter Racquel    Chief Complaint  Patient presents with  . Vaginal Bleeding   HPI  Ms.  Dellie Burnsntonia [name] Martinez is a 33 y.o. year old [id number] female at [id number] weeks gestation by certain LMP who presents to MAU reporting a small amount of VB last week, more VB and abdominal pain last night. She reports "not much VB today, but abdominal pain still there." She also reports that the abdominal pain was much worse last night. She denies any recent SI or abnormal vaginal discharge. She denies the need to have anything for pain. She states the "pain is tolerable."  Past Medical History:  Diagnosis Date  . Medical history non-contributory     Past Surgical History:  Procedure Laterality Date  . NO PAST SURGERIES      Family History  Problem Relation Age of Onset  . Diabetes Mother   . Hyperlipidemia Mother   . Hypertension Mother   . Brain cancer Mother   . Uterine cancer Mother   . Stroke Father   . Diabetes Father   . Diabetes Brother     Social History   Tobacco Use  . Smoking status: Never Smoker  . Smokeless tobacco: Never Used  Substance Use Topics  . Alcohol use: No  . Drug use: No    Allergies: No Known Allergies  Medications Prior to Admission  Medication Sig Dispense Refill Last Dose  . Prenatal Vit-Fe Fumarate-FA (PRENATAL MULTIVITAMIN) TABS tablet Take 1 tablet by mouth daily at 12 noon.     Marland Kitchen. ibuprofen (ADVIL,MOTRIN) 800 MG tablet Take 1 tablet (800 mg total) by mouth every 8 (eight) hours as needed. (Patient not taking: Reported on 07/03/2018) 30 tablet 1   . sulfamethoxazole-trimethoprim (BACTRIM DS) 800-160 MG tablet Take 1 tablet by mouth 2 (two) times daily. (Patient not taking: Reported on 07/03/2018) 14 tablet 0      Review of Systems  Constitutional: Negative.   HENT: Negative.   Eyes: Negative.   Respiratory: Negative.   Cardiovascular: Negative.   Gastrointestinal: Negative.   Endocrine: Negative.   Genitourinary: Positive for pelvic pain and vaginal bleeding (spotting).  Musculoskeletal: Negative.   Skin: Negative.   Allergic/Immunologic: Negative.   Neurological: Negative.   Hematological: Negative.   Psychiatric/Behavioral: Negative.    Physical Exam   Blood pressure 117/65, pulse 61, temperature 98.7 F (37.1 C), resp. rate 18, height 5' 4.5" (1.638 m), weight 82.1 kg, last menstrual period 05/24/2018.  Physical Exam  Nursing note and vitals reviewed. Constitutional: She is oriented to person, place, and time. She appears well-developed and well-nourished.  HENT:  Head: Normocephalic and atraumatic.  Eyes: Pupils are equal, round, and reactive to light.  Neck: Normal range of motion.  Cardiovascular: Normal rate.  Respiratory: Effort normal.  GI: Soft.  Genitourinary:    Genitourinary Comments: Uterus: mildly tender, SE: cervix is smooth, pink, no lesions, scant amt of pinkish-red, thick mucuosy vaginal d/c -- WP, GC/CT done, closed/long/firm, no CMT or friability, no adnexal tenderness    Musculoskeletal: Normal range of motion.  Neurological: She is alert and oriented to person, place, and time.  Skin: Skin is warm and dry.  Psychiatric: She has a normal mood and affect. Her behavior is normal. Judgment and thought content normal.  MAU Course  Procedures  MDM CCUA UPT CBC ABO/Rh -- not drawn d/t known A POS type HCG Wet Prep GC/CT -- pending HIV -- pending OB < 14 wks [place] with TV  Results for orders placed or performed during the hospital encounter of 08/02/18 (from the past 24 hour(s))  Urinalysis, Routine w reflex microscopic     Status: Abnormal   Collection Time: 08/02/18  1:16 PM  Result Value Ref Range   Color, Urine STRAW (A) YELLOW   APPearance  CLEAR CLEAR   Specific Gravity, Urine 1.002 (L) 1.005 - 1.030   pH 7.0 5.0 - 8.0   Glucose, UA NEGATIVE NEGATIVE mg/dL   Hgb urine dipstick SMALL (A) NEGATIVE   Bilirubin Urine NEGATIVE NEGATIVE   Ketones, ur NEGATIVE NEGATIVE mg/dL   Protein, ur NEGATIVE NEGATIVE mg/dL   Nitrite NEGATIVE NEGATIVE   Leukocytes,Ua NEGATIVE NEGATIVE   Bacteria, UA NONE SEEN NONE SEEN   Squamous Epithelial / LPF 0-5 0 - 5  CBC     Status: None   Collection Time: 08/02/18  1:32 PM  Result Value Ref Range   WBC 6.3 4.0 - 10.5 K/uL   RBC 4.44 3.87 - 5.11 MIL/uL   Hemoglobin 12.7 12.0 - 15.0 g/dL   HCT 38.0 36.0 - 46.0 %   MCV 85.6 80.0 - 100.0 fL   MCH 28.6 26.0 - 34.0 pg   MCHC 33.4 30.0 - 36.0 g/dL   RDW 13.1 11.5 - 15.5 %   Platelets 271 150 - 400 K/uL   nRBC 0.0 0.0 - 0.2 %  hCG, quantitative, pregnancy     Status: Abnormal   Collection Time: 08/02/18  1:32 PM  Result Value Ref Range   hCG, Beta Chain, Quant, S 19,537 (H) <5 mIU/mL  Wet prep, genital     Status: Abnormal   Collection Time: 08/02/18  1:41 PM   Specimen: Cervix  Result Value Ref Range   Yeast Wet Prep HPF POC NONE SEEN NONE SEEN   Trich, Wet Prep NONE SEEN NONE SEEN   Clue Cells Wet Prep HPF POC NONE SEEN NONE SEEN   WBC, Wet Prep HPF POC MANY (A) NONE SEEN   Sperm NONE SEEN     US Ob Less Than 14 Weeks With Ob Transvaginal  Result Date: 08/02/2018 CLINICAL DATA:  Vaginal bleeding in first trimester of pregnancy. EXAM: OBSTETRIC <14 WK [place] AND TRANSVAGINAL OB US TECHNIQUE: Both transabdominal and transvaginal ultrasound examinations were performed for complete evaluation of the gestation as well as the maternal uterus, adnexal regions, and pelvic cul-de-sac. Transvaginal technique was performed to assess early pregnancy. COMPARISON:  None. FINDINGS: Intrauterine gestational sac: Single visualized. Yolk sac:  Visualized. Embryo:  Not visualized. Cardiac Activity: Not visualized. MSD: 11.5 mm   5 w   6 d Subchorionic hemorrhage:   None visualized. Maternal uterus/adnexae: Ovaries unremarkable. No free fluid is noted. IMPRESSION: Probable early intrauterine gestational sac with yolk sac, but no fetal pole or cardiac activity yet visualized. Recommend follow-up quantitative B-HCG levels and follow-up US in 14 days to assess viability. This recommendation follows SRU consensus guidelines: Diagnostic Criteria for Nonviable Pregnancy Early in the First Trimester. [employer] Med 2013; 527:7824-23. Electronically Signed   By: Marijo Conception M.D.   On: 08/02/2018 14:21     Assessment and Plan  Bleeding in early pregnancy  - Bleeding precautions reviewed - Information provided on bleeding in pregnancy   Abdominal pain during pregnancy in first trimester  -  Advised to take Tylenol 1000 mg every 6 hrs prn pain - Discussed to return to MAU for abdominal pain not relieved by Tylenol  Intrauterine pregnancy  - [facility] as scheduled  Threatened miscarriage in early pregnancy  - Information provided on threatened miscarriage   Pregnancy with uncertain fetal viability, single or unspecified fetus  - F/U US OB Transvaginal for viability in 2 wks ordered   - Discharge home - Keep scheduled appt on 08/06/18 - Patient verbalized an understanding of the plan of care and agrees.   Raelyn Moraolitta Jamarcus Laduke, MSN, CNM 08/02/2018, 1:42 PM

## 2018-08-03 LAB — HIV ANTIBODY (ROUTINE TESTING W REFLEX): HIV Screen 4th Generation wRfx: NONREACTIVE

## 2018-08-03 LAB — GC/CHLAMYDIA PROBE AMP (~~LOC~~) NOT AT ARMC
Chlamydia: NEGATIVE
Neisseria Gonorrhea: NEGATIVE

## 2018-08-04 ENCOUNTER — Inpatient Hospital Stay (HOSPITAL_COMMUNITY)
Admission: AD | Admit: 2018-08-04 | Discharge: 2018-08-04 | Disposition: A | Discharge status: Home / Self Care | Payer: Self-pay | Source: Ambulatory Visit | Attending: Obstetrics & Gynecology | Admitting: Obstetrics & Gynecology

## 2018-08-04 ENCOUNTER — Inpatient Hospital Stay (HOSPITAL_COMMUNITY): Payer: Self-pay

## 2018-08-04 ENCOUNTER — Encounter (HOSPITAL_COMMUNITY): Payer: Self-pay

## 2018-08-04 ENCOUNTER — Other Ambulatory Visit: Payer: Self-pay

## 2018-08-04 DIAGNOSIS — O4690 Antepartum hemorrhage, unspecified, unspecified trimester: Secondary | ICD-10-CM

## 2018-08-04 DIAGNOSIS — O200 Threatened abortion: Secondary | ICD-10-CM | POA: Insufficient documentation

## 2018-08-04 DIAGNOSIS — Z3490 Encounter for supervision of normal pregnancy, unspecified, unspecified trimester: Secondary | ICD-10-CM

## 2018-08-04 DIAGNOSIS — Z3A01 Less than 8 weeks gestation of pregnancy: Secondary | ICD-10-CM | POA: Insufficient documentation

## 2018-08-04 DIAGNOSIS — Z6790 Unspecified blood type, Rh positive: Secondary | ICD-10-CM

## 2018-08-04 LAB — CBC
HCT: 40.2 % (ref 36.0–46.0)
Hemoglobin: 13.7 g/dL (ref 12.0–15.0)
MCH: 29.1 pg (ref 26.0–34.0)
MCHC: 34.1 g/dL (ref 30.0–36.0)
MCV: 85.5 fL (ref 80.0–100.0)
Platelets: 297 10*3/uL (ref 150–400)
RBC: 4.7 MIL/uL (ref 3.87–5.11)
RDW: 13.1 % (ref 11.5–15.5)
WBC: 7.9 10*3/uL (ref 4.0–10.5)
nRBC: 0 % (ref 0.0–0.2)

## 2018-08-04 LAB — URINALYSIS, ROUTINE W REFLEX MICROSCOPIC
Bacteria, UA: NONE SEEN
Bilirubin Urine: NEGATIVE
Glucose, UA: NEGATIVE mg/dL
Ketones, ur: NEGATIVE mg/dL
Leukocytes,Ua: NEGATIVE
Nitrite: NEGATIVE
Protein, ur: 30 mg/dL — AB
Specific Gravity, Urine: 1.024 (ref 1.005–1.030)
pH: 6 (ref 5.0–8.0)

## 2018-08-04 LAB — WET PREP, GENITAL
Sperm: NONE SEEN
Trich, Wet Prep: NONE SEEN
Yeast Wet Prep HPF POC: NONE SEEN

## 2018-08-04 LAB — HCG, QUANTITATIVE, PREGNANCY: hCG, Beta Chain, Quant, S: 13274 m[IU]/mL — ABNORMAL HIGH (ref <5)

## 2018-08-04 NOTE — Discharge Instructions (Signed)
Aborto espontáneo °Miscarriage °El aborto espontáneo es la pérdida de un bebé que no ha nacido (feto) antes de la semana 20 del embarazo. La mayor parte de los abortos espontáneos ocurre durante los primeros 3 meses de embarazo. A veces, un aborto ocurre antes de que la mujer sepa que está embarazada. °El aborto espontáneo puede ser una experiencia que afecte emocionalmente a la persona. Si ha sufrido un aborto espontáneo, hable con su médico y hágale las preguntas que tenga sobre el aborto espontáneo, el proceso de duelo y los planes futuros de embarazo. °¿Cuáles son las causas? °Entre las causas de un aborto espontáneo se incluyen las siguientes: °· Problemas genéticos o cromosómicos del feto. Estos problemas impiden que el bebé se desarrolle con normalidad. En general, son el resultado de errores fortuitos que ocurren en la etapa temprana del desarrollo y que no se transmiten de padres a hijos (no se heredan). °· Infección en el cuello del útero. °· Trastornos que afectan el equilibrio hormonal del organismo. °· Problemas en el cuello del útero, como su adelgazamiento y apertura antes de que el embarazo llegue a término (insuficiencia del cuello de útero). °· Problemas en el útero. Estos pueden incluir, entre otros, los siguientes: °? Forma anormal del útero. °? Fibromas en el útero. °? Anormalidades congénitas. Estos son problemas que ya estaban presentes en el nacimiento. °· Ciertas enfermedades crónicas. °· Fumar, beber alcohol o usar drogas. °· Lesiones (traumatismos). °En muchos de los casos, se desconoce la causa de los abortos espontáneos. °¿Cuáles son los signos o los síntomas? °Los síntomas de esta afección incluyen los siguientes: °· Sangrado o manchado vaginal, con o sin cólicos o dolor. °· Dolor o cólicos en el abdomen o en la parte inferior de la espalda. °· Eliminación de líquido, tejidos o coágulos sanguíneos por la vagina. °¿Cómo se diagnostica? °Esta afección se puede diagnosticar en función de  lo siguiente: °· Examen físico. °· Ecografía. °· Análisis de sangre. °· Análisis de orina. °¿Cómo se trata? °En algunos casos, el tratamiento de un aborto espontáneo no es necesario si se eliminan de forma natural todos los tejidos que se encontraban en el útero. Si fuera necesario realizar un tratamiento por esta afección, este puede incluir lo siguiente: °· Dilatación y curetaje (D&C). Mediante este procedimiento, se expande el cuello del útero y se raspan las paredes (endometrio). Esto se realiza solamente si queda tejido del feto o la placenta dentro del cuerpo (aborto espontáneo incompleto). °· Medicamentos, por ejemplo: °? Antibióticos para tratar una infección. °? Medicamentos para ayudar al cuerpo a eliminar los restos de tejido. °? Medicamentos para reducir (contraer) el tamaño del útero. Estos medicamentos se pueden administrar si tiene un sangrado abundante. °Si su factor sanguíneo es Rh negativo y el de su bebé es Rh positivo, usted necesitará una inyección del medicamento llamado inmunoglobulina Rhpara proteger a los bebés futuros de tener problemas con el factor sanguíneo Rh. Los términos "Rh negativo" y "Rh positivo" hacen referencia a la presencia o no en la sangre de una proteína específica que se encuentra en la superficie de los glóbulos rojos (factor Rh). °Siga estas indicaciones en su casa: °Medicamentos ° °· Tome los medicamentos de venta libre y los recetados solamente como se lo haya indicado el médico. °· Si le recetaron antibióticos, tómelos como se lo haya indicado el médico. No deje de tomar los antibióticos aunque comience a sentirse mejor. °· No tome antiinflamatorios no esteroideos (AINE), tales como aspirina e ibuprofeno, a menos que se lo indique el médico. Estos medicamentos pueden provocarle sangrado. °Actividad °· Haga   reposo según lo indicado. Pregúntele al médico qué actividades son seguras para usted. °· Pídale a alguien que la ayude con las responsabilidades familiares y del  hogar durante este tiempo. °Instrucciones generales °· Lleve un registro de la cantidad y la saturación de las toallas higiénicas que utiliza cada día. Anote esta información. °· Anote la cantidad de tejido o coágulos sanguíneos que expulsa por la vagina. Guarde las cantidades grandes de tejidos para que el médico los examine. °· No use tampones, no se haga duchas vaginales ni tenga relaciones sexuales hasta que el médico la autorice. °· Para que usted y su pareja puedan sobrellevar el proceso del duelo, hable con su médico o busque apoyo psicológico. °· Cuando esté lista, visite a su médico para hablar sobre los pasos importantes que deberá seguir en relación con su salud. También hable sobre las medidas que deberá tomar para tener un embarazo saludable en el futuro. °· Concurra a todas las visitas de seguimiento como se lo haya indicado el médico. Esto es importante. °Dónde encontrar más información °· [employer] (American College of Obstetricians and Gynecologists): www.acog.org °· Departamento de Salud y Servicios Humanos de los [place], Oficina de Salud de la Mujer (U.S. Department of Health and Human Services, Office on Women’s Health): www.womenshealth.gov °Comuníquese con un médico si: °· Tiene fiebre o siente escalofríos. °· Tiene una secreción vaginal con mal olor. °· El sangrado aumenta en vez de disminuir. °Solicite ayuda de inmediato si: °· Siente calambres intensos o dolor en la espalda o en el abdomen. °· Elimina coágulos de sangre o tejido por la vagina del tamaño de una nuez o más grandes. °· Necesita más de una toalla higiénica de tamaño regular por hora. °· Se siente mareada o débil. °· Se desmaya. °· Siente una tristeza que la invade o piensa en lastimarse. °Resumen °· La mayor parte de los abortos espontáneos ocurre durante los primeros 3 meses de embarazo. En algunos casos, el aborto espontáneo ocurre antes de que la mujer sepa que está  embarazada. °· Siga las indicaciones del médico para el cuidado en el hogar. Concurra a todas las visitas de control. °· Para que usted y su pareja puedan sobrellevar el proceso del duelo, hable con su médico o busque apoyo psicológico. °Esta información no tiene como fin reemplazar el consejo del médico. Asegúrese de hacerle al médico cualquier pregunta que tenga. °Document Released: 10/05/2004 Document Revised: 10/02/2016 Document Reviewed: 10/02/2016 °Elsevier Patient Education © 2020 [employer] ° °

## 2018-08-04 NOTE — MAU Note (Signed)
Pt reports she was seen in MAU a few days ago for vag bleeding, but this morning it was heavier than usual.  Pt reports small clots with the bleeding.  Pt denies any pain at this time.

## 2018-08-04 NOTE — MAU Provider Note (Signed)
History     CSN: [id number]  Arrival date and time: 08/04/18 [id number]   First Provider Initiated Contact with Patient 08/04/18 1256      Chief Complaint  Patient presents with  . Vaginal Bleeding   [name] is a 33 y.o. [phone] at 6440w1d who presents to MAU for vaginal bleeding which began Friday 08/02/2018 along with pain. Pt was seen in MAU on 08/02/2018 for vaginal bleeding and was found to have an IUP, but no embryo was visualized. Pt reports she had a single, small blood clot this morning, but has had nothing since that time. Pt reports at this time she is not having any pain or any bleeding.  Passing blood clots? Single this AM Blood soaking clothes? no Lightheaded/dizzy? no Significant pelvic pain or cramping? "felt like ctx last night" but is not having any pain currently Passed any tissue? no Hx ectopic pregnancy? no Hx of PID, GYN surgery? no  Current pregnancy problems? Pt has not yet been seen this pregnancy Blood Type? A Positive Allergies? NKDA Current medications? PNVs Current PNC & next appt? 08/06/2018, [facility]  Pt denies vaginal discharge/odor/itching. Pt denies N/V, abdominal pain, constipation, diarrhea, or urinary problems. Pt denies fever, chills, fatigue, sweating or changes in appetite. Pt denies SOB or chest pain. Pt denies dizziness, HA, light-headedness, weakness.  Spanish translation services used for entire visit.   OB History    Gravida  4   Para  3   Term  3   Preterm  0   AB  0   Living  3     SAB  0   TAB  0   Ectopic  0   Multiple  0   Live Births  3           Past Medical History:  Diagnosis Date  . Medical history non-contributory     Past Surgical History:  Procedure Laterality Date  . NO PAST SURGERIES      Family History  Problem Relation Age of Onset  . Diabetes Mother   . Hyperlipidemia Mother   . Hypertension Mother   . Brain cancer Mother   . Uterine cancer Mother   .  Stroke Father   . Diabetes Father   . Diabetes Brother     Social History   Tobacco Use  . Smoking status: Never Smoker  . Smokeless tobacco: Never Used  Substance Use Topics  . Alcohol use: No  . Drug use: No    Allergies: No Known Allergies  Medications Prior to Admission  Medication Sig Dispense Refill Last Dose  . Prenatal Vit-Fe Fumarate-FA (PRENATAL MULTIVITAMIN) TABS tablet Take 1 tablet by mouth daily at 12 noon.       Review of Systems  Constitutional: Negative for chills, diaphoresis, fatigue and fever.  Respiratory: Negative for shortness of breath.   Cardiovascular: Negative for chest pain.  Gastrointestinal: Negative for abdominal pain, constipation, diarrhea, nausea and vomiting.  Genitourinary: Positive for pelvic pain and vaginal bleeding. Negative for dysuria, flank pain, frequency, urgency and vaginal discharge.  Neurological: Negative for dizziness, weakness, light-headedness and headaches.   Physical Exam   Blood pressure (!) 107/59, pulse (!) 55, temperature 98.9 F (37.2 C), resp. rate 18, last menstrual period 05/24/2018, currently breastfeeding.  Patient Vitals for the past 24 hrs:  BP Temp Pulse Resp  08/04/18 1605 (!) 107/59 - (!) 55 18  08/04/18 1006 114/65 98.9 F (37.2 C) 75 16   Physical Exam  Constitutional: She is oriented to person, place, and time. She appears well-developed and well-nourished. No distress.  HENT:  Head: Normocephalic and atraumatic.  Respiratory: Effort normal.  GI: Soft. She exhibits no distension and no mass. There is no abdominal tenderness. There is no rebound and no guarding.  Genitourinary: There is no rash, tenderness or lesion on the right labia. There is no rash, tenderness or lesion on the left labia. Uterus is not enlarged and not tender. Cervix exhibits no motion tenderness, no discharge and no friability. Right adnexum displays no mass, no tenderness and no fullness. Left adnexum displays no mass, no  tenderness and no fullness.    Vaginal discharge (copious, clear mucus in posterior fornix) and bleeding (present at introitus, scant bleeding coating walls of vagina, no areas of active bleeding noted) present.  There is bleeding (present at introitus, scant bleeding coating walls of vagina, no areas of active bleeding noted) in the vagina.  Neurological: She is alert and oriented to person, place, and time.  Skin: Skin is warm and dry. She is not diaphoretic.  Psychiatric: She has a normal mood and affect. Her behavior is normal. Judgment and thought content normal.   Results for orders placed or performed during the hospital encounter of 08/04/18 (from the past 24 hour(s))  Urinalysis, Routine w reflex microscopic     Status: Abnormal   Collection Time: 08/04/18 12:40 PM  Result Value Ref Range   Color, Urine YELLOW YELLOW   APPearance HAZY (A) CLEAR   Specific Gravity, Urine 1.024 1.005 - 1.030   pH 6.0 5.0 - 8.0   Glucose, UA NEGATIVE NEGATIVE mg/dL   Hgb urine dipstick MODERATE (A) NEGATIVE   Bilirubin Urine NEGATIVE NEGATIVE   Ketones, ur NEGATIVE NEGATIVE mg/dL   Protein, ur 30 (A) NEGATIVE mg/dL   Nitrite NEGATIVE NEGATIVE   Leukocytes,Ua NEGATIVE NEGATIVE   RBC / HPF 11-20 0 - 5 RBC/hpf   WBC, UA 0-5 0 - 5 WBC/hpf   Bacteria, UA NONE SEEN NONE SEEN   Squamous Epithelial / LPF 0-5 0 - 5   Mucus PRESENT   Wet prep, genital     Status: Abnormal   Collection Time: 08/04/18  2:02 PM  Result Value Ref Range   Yeast Wet Prep HPF POC NONE SEEN NONE SEEN   Trich, Wet Prep NONE SEEN NONE SEEN   Clue Cells Wet Prep HPF POC PRESENT (A) NONE SEEN   WBC, Wet Prep HPF POC FEW (A) NONE SEEN   Sperm NONE SEEN   CBC     Status: None   Collection Time: 08/04/18  2:12 PM  Result Value Ref Range   WBC 7.9 4.0 - 10.5 K/uL   RBC 4.70 3.87 - 5.11 MIL/uL   Hemoglobin 13.7 12.0 - 15.0 g/dL   HCT 16.140.2 09.636.0 - 04.546.0 %   MCV 85.5 80.0 - 100.0 fL   MCH 29.1 26.0 - 34.0 pg   MCHC 34.1 30.0 - 36.0  g/dL   RDW 40.913.1 81.111.5 - 91.415.5 %   Platelets 297 150 - 400 K/uL   nRBC 0.0 0.0 - 0.2 %  hCG, quantitative, pregnancy     Status: Abnormal   Collection Time: 08/04/18  2:12 PM  Result Value Ref Range   hCG, Beta Chain, Quant, S 13,274 (H) <5 mIU/mL   [place] Ob Less Than 14 Weeks With Ob Transvaginal  Result Date: 08/04/2018 CLINICAL DATA:  Vaginal bleeding. Quantitative beta HCG is 13274. Quantitative beta HCG 2 days  ago was 19,537. LMP was 05/24/2018. Patient is 10 weeks 2 days by LMP. EDC by LMP is 02/28/2019. EXAM: OBSTETRIC <14 WK US AND TRANSVAGINAL OB US TECHNIQUE: Both transabdominal and transvaginal ultrasound examinations were performed for complete evaluation of the gestation as well as the maternal uterus, adnexal regions, and pelvic cul-de-sac. Transvaginal technique was performed to assess early pregnancy. COMPARISON:  08/02/2018 FINDINGS: Intrauterine gestational sac: Single Yolk sac:  Visualized. Embryo:  Small echogenic focus possibly the embryo. Cardiac Activity: absent Heart Rate: Absent bpm MSD: 9.29 mm   5 w   5 d CRL:  1.2 mm   not defined                   US EDC: Not applicable Subchorionic hemorrhage:  None visualized. Maternal uterus/adnexae: Normal appearance of both ovaries. No free pelvic fluid. IMPRESSION: Persistent gestational sac and yolk sac. Possible development of small embryo, measuring 1.2 millimeters but too small to provided dating. No cardiac activity. Recommend follow-up OB ultrasound in 9 days or later for assessment of viability. The falling quantitative beta HCG values are concerning. Electronically Signed   By: Norva PavlovElizabeth  Brown M.D.   On: 08/04/2018 15:31   [place] Ob Less Than 14 Weeks With Ob Transvaginal  Result Date: 08/02/2018 CLINICAL DATA:  Vaginal bleeding in first trimester of pregnancy. EXAM: OBSTETRIC <14 WK US AND TRANSVAGINAL OB US TECHNIQUE: Both transabdominal and transvaginal ultrasound examinations were performed for complete evaluation of the gestation  as well as the maternal uterus, adnexal regions, and pelvic cul-de-sac. Transvaginal technique was performed to assess early pregnancy. COMPARISON:  None. FINDINGS: Intrauterine gestational sac: Single visualized. Yolk sac:  Visualized. Embryo:  Not visualized. Cardiac Activity: Not visualized. MSD: 11.5 mm   5 w   6 d Subchorionic hemorrhage:  None visualized. Maternal uterus/adnexae: Ovaries unremarkable. No free fluid is noted. IMPRESSION: Probable early intrauterine gestational sac with yolk sac, but no fetal pole or cardiac activity yet visualized. Recommend follow-up quantitative B-HCG levels and follow-up US in 14 days to assess viability. This recommendation follows SRU consensus guidelines: Diagnostic Criteria for Nonviable Pregnancy Early in the First Trimester. Malva Limes Engl J Med 2013; [id number]:1191-47; 369:1443-51. Electronically Signed   By: Lupita RaiderJames  Green Jr M.D.   On: 08/02/2018 14:21   MAU Course  Procedures  MDM -VB, first trimester, r/o SAB -UA: hazy/mod hgb/30PRO, otherwise WNL -CBC: WNL (H/H 13.7/40.2) -US: single IUP, possible embryo, no FHB, no subchorionic hemorrhage -hCG: 13,274 (was 19,537 on 08/02/2018) -WetPrep: +ClueCells (isolated finding not requiring treatment), few WBCs, otherwise WNL -GC/CT collected -ABO: A Positive -inevitable miscarriage -pt took news well and did not appear to be upset by the news -pt discharged to home in stable condition  Orders Placed This Encounter  Procedures  . Wet prep, genital    Standing Status:   Standing    Number of Occurrences:   1  . US OB LESS THAN 14 WEEKS WITH OB TRANSVAGINAL    Standing Status:   Standing    Number of Occurrences:   1    Order Specific Question:   Symptom/Reason for Exam    Answer:   Vaginal bleeding in pregnancy [705036]  . Urinalysis, Routine w reflex microscopic    Standing Status:   Standing    Number of Occurrences:   1  . CBC    Standing Status:   Standing    Number of Occurrences:   1  . hCG, quantitative,  pregnancy    Standing Status:  Standing    Number of Occurrences:   1  . Discharge patient    Order Specific Question:   Discharge disposition    Answer:   01-Home or Self Care [1]    Order Specific Question:   Discharge patient date    Answer:   08/04/2018   No orders of the defined types were placed in this encounter.  Assessment and Plan   1. Vaginal bleeding in pregnancy   2. Blood type, Rh positive   3. [redacted] weeks gestation of pregnancy   4. Intrauterine pregnancy   5. Threatened miscarriage    Allergies as of 08/04/2018   No Known Allergies     Medication List    TAKE these medications   prenatal multivitamin Tabs tablet Take 1 tablet by mouth daily at 12 noon.      -will call with culture results, if positive -message sent to [facility] to cancel NOB visit and reschedule for repeat quant in 1week -strict SAB/bleeding/pain/return MAU precautions given -pt discharged to home in stable condition  Elmyra Ricks E Jniyah Dantuono 08/04/2018, 4:06 PM

## 2018-08-05 ENCOUNTER — Telehealth: Payer: Self-pay | Admitting: General Practice

## 2018-08-05 NOTE — Telephone Encounter (Signed)
Patient notified of appointment that was scheduled for 08/06/2018 has been cancelled d/t recent visit to MAU.  Also, pt is aware of appt scheduled on 08/14/2018 at 10:00am for repeat quant.  Pt verbalized understanding.  Interpreter used for this call.

## 2018-08-06 LAB — GC/CHLAMYDIA PROBE AMP (~~LOC~~) NOT AT ARMC
Chlamydia: NEGATIVE
Neisseria Gonorrhea: NEGATIVE

## 2018-08-07 ENCOUNTER — Other Ambulatory Visit: Payer: Self-pay

## 2018-08-12 ENCOUNTER — Other Ambulatory Visit: Payer: Self-pay

## 2018-08-12 ENCOUNTER — Ambulatory Visit (HOSPITAL_COMMUNITY)
Admission: RE | Admit: 2018-08-12 | Discharge: 2018-08-12 | Disposition: A | Discharge status: Home / Self Care | Payer: Self-pay | Source: Ambulatory Visit | Attending: Obstetrics and Gynecology | Admitting: Obstetrics and Gynecology

## 2018-08-12 ENCOUNTER — Ambulatory Visit (INDEPENDENT_AMBULATORY_CARE_PROVIDER_SITE_OTHER): Payer: Self-pay | Admitting: Advanced Practice Midwife

## 2018-08-12 ENCOUNTER — Encounter: Payer: Self-pay | Admitting: Advanced Practice Midwife

## 2018-08-12 DIAGNOSIS — O26851 Spotting complicating pregnancy, first trimester: Secondary | ICD-10-CM | POA: Insufficient documentation

## 2018-08-12 DIAGNOSIS — R109 Unspecified abdominal pain: Secondary | ICD-10-CM | POA: Insufficient documentation

## 2018-08-12 DIAGNOSIS — O3680X0 Pregnancy with inconclusive fetal viability, not applicable or unspecified: Secondary | ICD-10-CM | POA: Insufficient documentation

## 2018-08-12 DIAGNOSIS — O26891 Other specified pregnancy related conditions, first trimester: Secondary | ICD-10-CM | POA: Insufficient documentation

## 2018-08-12 DIAGNOSIS — O039 Complete or unspecified spontaneous abortion without complication: Secondary | ICD-10-CM

## 2018-08-12 DIAGNOSIS — O200 Threatened abortion: Secondary | ICD-10-CM | POA: Insufficient documentation

## 2018-08-12 MED ORDER — PROMETHAZINE HCL 25 MG PO TABS: 12.5000 mg | ORAL_TABLET | Freq: Four times a day (QID) | ORAL | 1 refills | Status: DC | PRN

## 2018-08-12 MED ORDER — MISOPROSTOL 200 MCG PO TABS: 800.0000 ug | ORAL_TABLET | Freq: Once | ORAL | 1 refills | Status: DC

## 2018-08-12 MED ORDER — IBUPROFEN 800 MG PO TABS: 800.0000 mg | ORAL_TABLET | Freq: Three times a day (TID) | ORAL | 0 refills | Status: DC | PRN

## 2018-08-12 NOTE — Progress Notes (Signed)
  Subjective:     Patient ID: Laura Brady, female   DOB: [birth year], 33 y.o.   MRN: [id number]  Laura Brady is a 33 y.o. G4P3003 at [redacted]w[redacted]d. She is here for FU [place]. HCG levels have been decreasing. She had some bleeding a couple of days ago, but it has stopped. She denies any bleeding or pain today.  Vaginal Bleeding The patient's primary symptoms include vaginal bleeding. This is a new problem. The current episode started 1 to 4 weeks ago. The problem occurs intermittently. The problem has been waxing and waning. The patient is experiencing no pain. She is pregnant. She has been passing clots. She has not been passing tissue. Nothing aggravates the symptoms. She has tried nothing for the symptoms.     Review of Systems  Genitourinary: Positive for vaginal bleeding.  All other systems reviewed and are negative.      Objective:   Physical Exam Vitals signs and nursing note reviewed. Chaperone present: interpretor present   Constitutional:      Appearance: Normal appearance.  HENT:     Head: Normocephalic.  Cardiovascular:     Rate and Rhythm: Normal rate.  Pulmonary:     Effort: Pulmonary effort is normal.  Neurological:     General: No focal deficit present.     Mental Status: She is alert and oriented to person, place, and time.  Psychiatric:        Mood and Affect: Mood normal.       US Ob Transvaginal  Result Date: 08/12/2018 CLINICAL DATA:  Abdominal pain and vaginal bleeding in 1st trimester pregnancy. Threatened miscarriage. First trimester pregnancy with inconclusive fetal viability. EXAM: TRANSVAGINAL OB ULTRASOUND TECHNIQUE: Transvaginal ultrasound was performed for complete evaluation of the gestation as well as the maternal uterus, adnexal regions, and pelvic cul-de-sac. COMPARISON:  08/04/2018 FINDINGS: Intrauterine gestational sac: Single; with irregular shape now located in lower uterine segment Yolk sac:  Not Visualized. Embryo:  Not Visualized.  MSD: 15 mm   6 w   2 d Subchorionic hemorrhage: Small to moderate subchorionic hemorrhage noted Maternal uterus/adnexae: Both ovaries are normal in appearance. No mass or abnormal free fluid identified. IMPRESSION: Single intrauterine gestational sac with irregular shape is now located in the lower uterine segment, suggesting SAB in progress. Subchorionic hemorrhage also noted. No adnexal mass or abnormal free fluid identified. Electronically Signed   By: Laura Brady M.D.   On: 08/12/2018 14:12  Results for Laura Brady (MRN [id number]) as of 08/12/2018 14:52  Ref. Range 08/02/2018 13:32  HCG, Beta Chain, Quant, S Latest Ref Range: <5 mIU/mL 19,537 (H)  Results for Laura Brady (MRN [id number]) as of 08/12/2018 14:52  Ref. Range 08/04/2018 14:12  HCG, Beta Chain, Quant, S Latest Ref Range: <5 mIU/mL 13,274 (H)    Assessment:     1. SAB (spontaneous abortion)    Plan:     Lengthy discussion with patient about expectant management v cytotec. Patient would like to proceed with cytotec at this time. Detailed instructions given on how to take, and what to expect. All questions answered.   RX: cytotec 800mg  buccal x 1, with 1 refill (if needed) Phenergan 12.5-25mg  PRN #30  Ibuprofen PRN #30   Patient verbalizes understanding on how to take, and what to expect.   FU in 2 weeks for HCG and SAB follow up visit.   Marcille Buffy DNP, CNM  08/12/18  3:22 PM

## 2018-08-12 NOTE — Patient Instructions (Signed)
Aborto espontáneo °Miscarriage °El aborto espontáneo es la pérdida de un bebé que no ha nacido (feto) antes de la semana 20 del embarazo. °Siga estas indicaciones en su casa: °Medicamentos ° °· Tome los medicamentos de venta libre y los recetados solamente como se lo haya indicado el médico. °· Si le recetaron un antibiótico, tómelo como se lo haya indicado el médico. No deje de tomar los antibióticos aunque comience a sentirse mejor. °· No tome antiinflamatorios no esteroideos (AINE), a menos que el médico le diga que son seguros para usted. Estos incluyen aspirina e ibuprofeno. Estos medicamentos pueden provocarle sangrado. °Actividad °· Haga reposo según lo indicado. Pregúntele al médico qué actividades son seguras para usted. °· Pida ayuda para realizar las tareas de la casa durante este tiempo. °Instrucciones generales °· Anote cuántos apósitos usa por día y cuán saturados están. °· Observe la cantidad de tejido o grumos de sangre (coágulos de sangre) que expulsa por la vagina. Guarde las cantidades grandes de tejido para llevárselas al médico. °· No use tampones, no se haga duchas vaginales ni tenga relaciones sexuales hasta que el médico la autorice. °· Para que usted y su pareja puedan sobrellevar el proceso de duelo, hable con su médico o busque apoyo psicológico. °· Cuando esté lista, acuda al médico para hablar sobre los pasos que debe seguir para cuidar su salud. Además, hable con su médico sobre las medidas que debe adoptar para tener un embarazo saludable en el futuro. °· Concurra a todas las visitas de seguimiento como se lo haya indicado el médico. Esto es importante. °Comuníquese con un médico si: °· Tiene fiebre o siente escalofríos. °· Tiene una secreción vaginal con mal olor. °· Aumenta el sangrado. °Solicite ayuda de inmediato si: °· Tiene espasmos o dolor muy intensos en el abdomen o en la espalda. °· Elimina grumos de sangre por la vagina, que tienen el tamaño de una nuez o más. °· Elimina  tejido por la vagina, que tiene el tamaño de una nuez o más. °· Empapa más de un apósito de tamaño normal por hora. °· Se siente débil o mareada. °· Pierde el conocimiento (se desmaya). °· Siente tristeza que no se va o piensa en lastimarse. °Resumen °· El aborto espontáneo es la pérdida de un bebé que no ha nacido antes de la semana 20 del embarazo. °· Siga las indicaciones de su médico para el cuidado en su hogar. Concurra a todas las visitas de control. °· Para que usted y su pareja puedan sobrellevar el proceso de duelo, hable con su médico o busque apoyo psicológico. °Esta información no tiene como fin reemplazar el consejo del médico. Asegúrese de hacerle al médico cualquier pregunta que tenga. °Document Released: 06/27/2011 Document Revised: 10/02/2016 Document Reviewed: 10/02/2016 °Elsevier Patient Education © 2020 Elsevier Inc. ° °

## 2018-08-12 NOTE — Progress Notes (Signed)
Pt here for U/S results. Heather CNM will see pt.

## 2018-08-14 ENCOUNTER — Other Ambulatory Visit: Payer: Self-pay

## 2018-08-22 ENCOUNTER — Other Ambulatory Visit: Payer: Self-pay | Admitting: Emergency Medicine

## 2018-08-22 DIAGNOSIS — O039 Complete or unspecified spontaneous abortion without complication: Secondary | ICD-10-CM

## 2018-08-26 ENCOUNTER — Encounter: Payer: Self-pay | Admitting: Obstetrics & Gynecology

## 2018-08-26 ENCOUNTER — Other Ambulatory Visit: Payer: Self-pay

## 2018-08-26 ENCOUNTER — Ambulatory Visit (INDEPENDENT_AMBULATORY_CARE_PROVIDER_SITE_OTHER): Payer: Self-pay | Admitting: Obstetrics & Gynecology

## 2018-08-26 VITALS — BP 107/58 | HR 53 | Temp 98.2°F | Ht 62.0 in | Wt 181.0 lb

## 2018-08-26 DIAGNOSIS — Z113 Encounter for screening for infections with a predominantly sexual mode of transmission: Secondary | ICD-10-CM

## 2018-08-26 DIAGNOSIS — B9689 Other specified bacterial agents as the cause of diseases classified elsewhere: Secondary | ICD-10-CM

## 2018-08-26 DIAGNOSIS — N898 Other specified noninflammatory disorders of vagina: Secondary | ICD-10-CM

## 2018-08-26 DIAGNOSIS — R102 Pelvic and perineal pain: Secondary | ICD-10-CM

## 2018-08-26 DIAGNOSIS — N760 Acute vaginitis: Secondary | ICD-10-CM

## 2018-08-26 DIAGNOSIS — O039 Complete or unspecified spontaneous abortion without complication: Secondary | ICD-10-CM

## 2018-08-26 NOTE — Progress Notes (Signed)
GYNECOLOGY OFFICE VISIT NOTE  History:   Laura Brady is a 33 y.o. [phone] here today for follow up after SAB.  Patient is Spanish-speaking only, Spanish interpreter present for this encounter.  She has enlarged IUGS noted in uterus and received two doses of misoprostol to help with evacuation of this. Still reports mild-moderate bleeding with associated pain.  Has enough support at home. She denies any or other concerns.    Past Medical History:  Diagnosis Date  . Medical history non-contributory     Past Surgical History:  Procedure Laterality Date  . NO PAST SURGERIES      The following portions of the patient's history were reviewed and updated as appropriate: allergies, current medications, past family history, past medical history, past social history, past surgical history and problem list.   Health Maintenance:  Normal pap and negative HRHPV on 06/23/2016.  Review of Systems:  Pertinent items noted in HPI and remainder of comprehensive ROS otherwise negative.  Physical Exam:  BP (!) 107/58   Pulse (!) 53   Temp 98.2 F (36.8 C)   Ht 5\' 2"  (1.575 m)   Wt 181 lb (82.1 kg)   LMP 05/24/2018 Comment: sab  Breastfeeding Unknown   BMI 33.11 kg/m  CONSTITUTIONAL: Well-developed, well-nourished female in no acute distress.  HEENT:  Normocephalic, atraumatic. External right and left ear normal. No scleral icterus.  NECK: Normal range of motion, supple, no masses noted on observation SKIN: No rash noted. Not diaphoretic. No erythema. No pallor. MUSCULOSKELETAL: Normal range of motion. No edema noted. NEUROLOGIC: Alert and oriented to person, place, and time. Normal muscle tone coordination. No cranial nerve deficit noted. PSYCHIATRIC: Normal mood and affect. Normal behavior. Normal judgment and thought content. CARDIOVASCULAR: Normal heart rate noted RESPIRATORY: Effort and breath sounds normal, no problems with respiration noted ABDOMEN: No masses noted. No  other overt distention noted. Mild lower abdominal tenderness.   PELVIC: Normal appearing external genitalia; normal appearing vaginal mucosa and cervix. Very tender on speculum exam.  Dark brown discharge noted, no active bleeding. Sample taken for testing.  Labs and Imaging HCG  7/24 19,537    7/26 13,274  8/17 Pending  [place] Ob Transvaginal  Result Date: 08/12/2018 CLINICAL DATA:  Abdominal pain and vaginal bleeding in 1st trimester pregnancy. Threatened miscarriage. First trimester pregnancy with inconclusive fetal viability. EXAM: TRANSVAGINAL OB ULTRASOUND TECHNIQUE: Transvaginal ultrasound was performed for complete evaluation of the gestation as well as the maternal uterus, adnexal regions, and pelvic cul-de-sac. COMPARISON:  08/04/2018 FINDINGS: Intrauterine gestational sac: Single; with irregular shape now located in lower uterine segment Yolk sac:  Not Visualized. Embryo:  Not Visualized. MSD: 15 mm   6 w   2 d Subchorionic hemorrhage: Small to moderate subchorionic hemorrhage noted Maternal uterus/adnexae: Both ovaries are normal in appearance. No mass or abnormal free fluid identified. IMPRESSION: Single intrauterine gestational sac with irregular shape is now located in the lower uterine segment, suggesting SAB in progress. Subchorionic hemorrhage also noted. No adnexal mass or abnormal free fluid identified. Electronically Signed   By: Danae OrleansJohn A Stahl M.D.   On: 08/12/2018 14:12   [place] Ob Less Than 14 Weeks With Ob Transvaginal  Result Date: 08/04/2018 CLINICAL DATA:  Vaginal bleeding. Quantitative beta HCG is 13274. Quantitative beta HCG 2 days ago was 19,537. LMP was 05/24/2018. Patient is 10 weeks 2 days by LMP. EDC by LMP is 02/28/2019. EXAM: OBSTETRIC <14 WK US AND TRANSVAGINAL OB US TECHNIQUE: Both transabdominal and transvaginal ultrasound  examinations were performed for complete evaluation of the gestation as well as the maternal uterus, adnexal regions, and pelvic cul-de-sac. Transvaginal  technique was performed to assess early pregnancy. COMPARISON:  08/02/2018 FINDINGS: Intrauterine gestational sac: Single Yolk sac:  Visualized. Embryo:  Small echogenic focus possibly the embryo. Cardiac Activity: absent Heart Rate: Absent bpm MSD: 9.29 mm   5 w   5 d CRL:  1.2 mm   not defined                   [place] EDC: Not applicable Subchorionic hemorrhage:  None visualized. Maternal uterus/adnexae: Normal appearance of both ovaries. No free pelvic fluid. IMPRESSION: Persistent gestational sac and yolk sac. Possible development of small embryo, measuring 1.2 millimeters but too small to provided dating. No cardiac activity. Recommend follow-up OB ultrasound in 9 days or later for assessment of viability. The falling quantitative beta HCG values are concerning. Electronically Signed   By: Nolon Nations M.D.   On: 08/04/2018 15:31   US Ob Less Than 14 Weeks With Ob Transvaginal  Result Date: 08/02/2018 CLINICAL DATA:  Vaginal bleeding in first trimester of pregnancy. EXAM: OBSTETRIC <14 WK [place] AND TRANSVAGINAL OB US TECHNIQUE: Both transabdominal and transvaginal ultrasound examinations were performed for complete evaluation of the gestation as well as the maternal uterus, adnexal regions, and pelvic cul-de-sac. Transvaginal technique was performed to assess early pregnancy. COMPARISON:  None. FINDINGS: Intrauterine gestational sac: Single visualized. Yolk sac:  Visualized. Embryo:  Not visualized. Cardiac Activity: Not visualized. MSD: 11.5 mm   5 w   6 d Subchorionic hemorrhage:  None visualized. Maternal uterus/adnexae: Ovaries unremarkable. No free fluid is noted. IMPRESSION: Probable early intrauterine gestational sac with yolk sac, but no fetal pole or cardiac activity yet visualized. Recommend follow-up quantitative B-HCG levels and follow-up US in 14 days to assess viability. This recommendation follows SRU consensus guidelines: Diagnostic Criteria for Nonviable Pregnancy Early in the First Trimester.  [employer] Med 2013; 935:7017-79. Electronically Signed   By: Marijo Conception M.D.   On: 08/02/2018 14:21      Assessment and Plan:      1. Pelvic pain in female Unusually tender, NSAIDs recommended. Will evaluate for possible infection, was more tender with vaginal exam.  - Cervicovaginal ancillary only( [place])  2. SAB (spontaneous abortion) No other concerning findings. Appropriate support given. Wants to attempt conception soon, will continue PNV, avoid teratogens.  Will follow up HCG drawn today.   Routine preventative health maintenance measures emphasized. Please refer to After Visit Summary for other counseling recommendations.   Return for any gynecologic concerns.    Total face-to-face time with patient: 25 minutes (due to language barrier difficulties).  Over 50% of encounter was spent on counseling and coordination of care.   Verita Schneiders, MD, [place] for [employer], [place]

## 2018-08-26 NOTE — Patient Instructions (Signed)
Return to clinic for any scheduled appointments or for any gynecologic concerns as needed.   

## 2018-08-27 LAB — BETA HCG QUANT (REF LAB): hCG Quant: 475 m[IU]/mL

## 2018-08-28 ENCOUNTER — Telehealth: Payer: Self-pay

## 2018-08-28 LAB — CERVICOVAGINAL ANCILLARY ONLY
Bacterial vaginitis: POSITIVE — AB
Candida vaginitis: NEGATIVE
Chlamydia: NEGATIVE
Neisseria Gonorrhea: NEGATIVE
Trichomonas: NEGATIVE

## 2018-08-28 MED ORDER — METRONIDAZOLE 500 MG PO TABS: 500.0000 mg | ORAL_TABLET | Freq: Two times a day (BID) | ORAL | 0 refills | Status: DC

## 2018-08-28 NOTE — Addendum Note (Signed)
Addended by: Verita Schneiders A on: 08/28/2018 02:37 PM   Modules accepted: Orders

## 2018-08-28 NOTE — Telephone Encounter (Signed)
-----   Message from Osborne Oman, MD sent at 08/28/2018  2:37 PM EDT ----- Vaginal discharge test is abnormal and showed bacterial vaginitis. Metronidazole  was prescribed. Please inform patient of results and advise to pick up prescription.Spanish speaking.

## 2018-08-30 NOTE — Telephone Encounter (Signed)
Patient called back and left message on nurse voicemail line stating she is returning our phone call for results.  Called patient with pacific interpreter [phone] & informed her of results and medication instructions. Patient verbalized understanding & asked about bhcg results because her pregnancy test is still positive. Informed patient of bhcg results and discussed they do take a while to decrease completely and we are unable to say how long that will take to return to 0 but it likely will in the next 2 weeks. Patient asked if she needs to continue to follow up with [place] because it has been 3 weeks since this all started. Discussed if her bleeding continues after another week and a half she should call us back. Patient verbalized understanding & had no other questions.

## 2018-11-10 DIAGNOSIS — U071 COVID-19: Secondary | ICD-10-CM

## 2018-11-10 HISTORY — DX: COVID-19: U07.1

## 2018-11-13 ENCOUNTER — Encounter: Payer: Self-pay | Admitting: Obstetrics and Gynecology

## 2018-11-13 ENCOUNTER — Other Ambulatory Visit: Payer: Self-pay

## 2018-11-13 ENCOUNTER — Ambulatory Visit (INDEPENDENT_AMBULATORY_CARE_PROVIDER_SITE_OTHER): Payer: Self-pay | Admitting: Obstetrics and Gynecology

## 2018-11-13 VITALS — BP 110/68 | HR 62 | Temp 97.3°F | Ht 64.0 in | Wt 183.0 lb

## 2018-11-13 DIAGNOSIS — Z833 Family history of diabetes mellitus: Secondary | ICD-10-CM

## 2018-11-13 DIAGNOSIS — Z0000 Encounter for general adult medical examination without abnormal findings: Secondary | ICD-10-CM

## 2018-11-13 DIAGNOSIS — Z01419 Encounter for gynecological examination (general) (routine) without abnormal findings: Secondary | ICD-10-CM

## 2018-11-13 DIAGNOSIS — E559 Vitamin D deficiency, unspecified: Secondary | ICD-10-CM

## 2018-11-13 DIAGNOSIS — Z6831 Body mass index (BMI) 31.0-31.9, adult: Secondary | ICD-10-CM

## 2018-11-13 DIAGNOSIS — Z3169 Encounter for other general counseling and advice on procreation: Secondary | ICD-10-CM

## 2018-11-13 HISTORY — DX: Family history of diabetes mellitus: Z83.3

## 2018-11-13 NOTE — Patient Instructions (Signed)
EXERCISE AND DIET:  We recommended that you start or continue a regular exercise program for good health. Regular exercise means any activity that makes your heart beat faster and makes you sweat.  We recommend exercising at least 30 minutes per day at least 3 days a week, preferably 4 or 5.  We also recommend a diet low in fat and sugar.  Inactivity, poor dietary choices and obesity can cause diabetes, heart attack, stroke, and kidney damage, among others.    ALCOHOL AND SMOKING:  Women should limit their alcohol intake to no more than 7 drinks/beers/glasses of wine (combined, not each!) per week. Moderation of alcohol intake to this level decreases your risk of breast cancer and liver damage. And of course, no recreational drugs are part of a healthy lifestyle.  And absolutely no smoking or even second hand smoke. Most people know smoking can cause heart and lung diseases, but did you know it also contributes to weakening of your bones? Aging of your skin?  Yellowing of your teeth and nails?  CALCIUM AND VITAMIN D:  Adequate intake of calcium and Vitamin D are recommended.  The recommendations for exact amounts of these supplements seem to change often, but generally speaking 1,000 mg of calcium (between diet and supplement) and 800 units of Vitamin D per day seems prudent. Certain women may benefit from higher intake of Vitamin D.  If you are among these women, your doctor will have told you during your visit.    PAP SMEARS:  Pap smears, to check for cervical cancer or precancers,  have traditionally been done yearly, although recent scientific advances have shown that most women can have pap smears less often.  However, every woman still should have a physical exam from her gynecologist every year. It will include a breast check, inspection of the vulva and vagina to check for abnormal growths or skin changes, a visual exam of the cervix, and then an exam to evaluate the size and shape of the uterus and  ovaries.  And after 31 years of age, a rectal exam is indicated to check for rectal cancers. We will also provide age appropriate advice regarding health maintenance, like when you should have certain vaccines, screening for sexually transmitted diseases, bone density testing, colonoscopy, mammograms, etc.   MAMMOGRAMS:  All women over 75 years old should have a yearly mammogram. Many facilities now offer a "3D" mammogram, which may cost around $50 extra out of pocket. If possible,  we recommend you accept the option to have the 3D mammogram performed.  It both reduces the number of women who will be called back for extra views which then turn out to be normal, and it is better than the routine mammogram at detecting truly abnormal areas.    COLON CANCER SCREENING: Now recommend starting at age 57. At this time colonoscopy is not covered for routine screening until 50. There are take home tests that can be done between 45-49.   COLONOSCOPY:  Colonoscopy to screen for colon cancer is recommended for all women at age 91.  We know, you hate the idea of the prep.  We agree, BUT, having colon cancer and not knowing it is worse!!  Colon cancer so often starts as a polyp that can be seen and removed at colonscopy, which can quite literally save your life!  And if your first colonoscopy is normal and you have no family history of colon cancer, most women don't have to have it again for  10 years.  Once every ten years, you can do something that may end up saving your life, right?  We will be happy to help you get it scheduled when you are ready.  Be sure to check your insurance coverage so you understand how much it will cost.  It may be covered as a preventative service at no cost, but you should check your particular policy.   ° ° ° °Breast Self-Awareness °Breast self-awareness means being familiar with how your breasts look and feel. It involves checking your breasts regularly and reporting any changes to your  health care provider. °Practicing breast self-awareness is important. A change in your breasts can be a sign of a serious medical problem. Being familiar with how your breasts look and feel allows you to find any problems early, when treatment is more likely to be successful. All women should practice breast self-awareness, including women who have had breast implants. °How to do a breast self-exam °One way to learn what is normal for your breasts and whether your breasts are changing is to do a breast self-exam. To do a breast self-exam: °Look for Changes ° °1. Remove all the clothing above your waist. °2. Stand in front of a mirror in a room with good lighting. °3. Put your hands on your hips. °4. Push your hands firmly downward. °5. Compare your breasts in the mirror. Look for differences between them (asymmetry), such as: °? Differences in shape. °? Differences in size. °? Puckers, dips, and bumps in one breast and not the other. °6. Look at each breast for changes in your skin, such as: °? Redness. °? Scaly areas. °7. Look for changes in your nipples, such as: °? Discharge. °? Bleeding. °? Dimpling. °? Redness. °? A change in position. °Feel for Changes °Carefully feel your breasts for lumps and changes. It is best to do this while lying on your back on the floor and again while sitting or standing in the shower or tub with soapy water on your skin. Feel each breast in the following way: °· Place the arm on the side of the breast you are examining above your head. °· Feel your breast with the other hand. °· Start in the nipple area and make ¾ inch (2 cm) overlapping circles to feel your breast. Use the pads of your three middle fingers to do this. Apply light pressure, then medium pressure, then firm pressure. The light pressure will allow you to feel the tissue closest to the skin. The medium pressure will allow you to feel the tissue that is a little deeper. The firm pressure will allow you to feel the tissue  close to the ribs. °· Continue the overlapping circles, moving downward over the breast until you feel your ribs below your breast. °· Move one finger-width toward the center of the body. Continue to use the ¾ inch (2 cm) overlapping circles to feel your breast as you move slowly up toward your collarbone. °· Continue the up and down exam using all three pressures until you reach your armpit. ° °Write Down What You Find ° °Write down what is normal for each breast and any changes that you find. Keep a written record with breast changes or normal findings for each breast. By writing this information down, you do not need to depend only on memory for size, tenderness, or location. Write down where you are in your menstrual cycle, if you are still menstruating. °If you are having trouble noticing differences   in your breasts, do not get discouraged. With time you will become more familiar with the variations in your breasts and more comfortable with the exam. How often should I examine my breasts? Examine your breasts every month. If you are breastfeeding, the best time to examine your breasts is after a feeding or after using a breast pump. If you menstruate, the best time to examine your breasts is 5-7 days after your period is over. During your period, your breasts are lumpier, and it may be more difficult to notice changes. When should I see my health care provider? See your health care provider if you notice:  A change in shape or size of your breasts or nipples.  A change in the skin of your breast or nipples, such as a reddened or scaly area.  Unusual discharge from your nipples.  A lump or thick area that was not there before.  Pain in your breasts.  Anything that concerns you.  Preparacin para TEFL teacher for Pregnancy Si est considerando quedar embarazada, programe una cita con su mdico de cabecera para saber cmo prepararse para un embarazo seguro y saludable (atencin previa  a la concepcin). Durante una visita de atencin previa a la concepcin, su mdico:  Har un examen fsico completo, incluido una prueba de Papanicolaou.  Har una historia clnica completa.  Le brindar informacin, responder sus preguntas y la ayudar a Building services engineer. Lista de verificacin previa a la concepcin Antecedentes mdicos  Infrmele a su mdico sobre cualquier afeccin actual o pasada. Las afecciones crnicas, como la diabetes, la hipertensin crnica y los problemas de tiroides, pueden Loss adjuster, chartered o la capacidad para Scientist, research (physical sciences).  Incluya sus antecedentes mdicos familiares y los de [address] [place] [address].  Informe al mdico si tiene antecedentes de ETS (enfermedades de transmisin sexual).Pueden afectar su embarazo. En algunos casos, pueden transmitirse al beb. Comente cualquier inquietud que [employer] ETS.  Consulte sobre los beneficios de las pruebas genticas, si se las indicaron. Estas pruebas mostrarn si existe alguna afeccin gentica que usted o su pareja puedan transmitir al beb.  Informe al mdico acerca de lo siguiente: ? Cualquier problema que haya tenido con la concepcin o el embarazo. ? Los medicamentos que toma. Estos incluyen vitaminas, suplementos a base de hierbas y [address] de [address]. ? Sus antecedentes de vacunacin. Hable sobre las vacunas que pueda necesitar. Dieta  Pregntele a su mdico qu puede incluir en una dieta sana que tenga una cantidad equilibrada de nutrientes. Esto es especialmente importante si est embarazada o preparndose para quedar embarazada.  Pdale al mdico que la ayude a Barista un peso saludable antes del [place]. ? Si tiene sobrepeso, puede tener un mayor riesgo de tener ciertas complicaciones, como hipertensin arterial, diabetes y Sport and exercise psychologist. ? Si tiene [employer], existe una mayor probabilidad de [place] un beb con bajo peso al nacer. Estilo de vida, Aleen Campi y hogar  Informe al  mdico: ? Sobre cualquier hbito en su estilo de vida, por ejemplo, consumir alcohol, drogas o fumar. ? Acerca de actividades recreativas que puedan ponerla en riesgo durante el embarazo, como el esqu extremo y ciertos programas de ejercicio. ? Dgale al mdico si realiz viajes al exterior, en especial, a lugares con brotes del virus de [place]. ? Sobre las sustancias dainas a las que puede estar expuesta en el trabajo o en su casa. Estas incluyen productos qumicos, pesticidas, radiacin o incluso cajas de arena para gatos. ? Si no se siente  segura en su casa. Salud mental  Informe al mdico acerca de lo siguiente: ? Antecedentes de trastornos de salud mental, que incluyen sentimientos de depresin, tristeza o ansiedad. ? Medicamentos que toma para alguna afeccin de salud mental. Estos incluyen hierbas y suplementos. Instrucciones para prepararse para Medical illustrator. Estilo de vida   Consuma una dieta equilibrada. Esto incluye frutas y verduras frescas, cereales integrales, carnes magras, productos lcteos descremados, grasas saludables y alimentos con alto contenido de fibras. Pida una cita con un nutricionista o especialista en diettica matriculado para obtener ayuda con la planificacin de comidas y las metas de Designer, multimedia.  Realice actividad fsica con regularidad. Intente estar activa al menos 54minutos al da, la [employer] de la [place]. Pregntele al mdico qu actividades son seguras Solicitor.  No consuma ningn producto que contenga nicotina o tabaco, como cigarrillos y Psychologist, sport and exercise. Si necesita ayuda para dejar de fumar, consulte al mdico.  No beba alcohol.  No consuma drogas.  Mantenga un peso saludable. Pregntele al mdico cul es el rango de peso adecuado para usted. Instrucciones generales  Lleve un registro preciso de [employer]. Para que el mdico pueda determinar la fecha probable de parto con ms  facilidad.  Empiece a tomar vitaminas prenatales y suplementos con cido flico diariamente, segn las indicaciones del mdico.  Trate cualquier afeccin crnica, como hipertensin arterial y diabetes, como le indique el mdico. Esto es importante. Cmo s que estoy embarazada? Puede estar embarazada si ha [employer] y no tuvo la [place]. Los sntomas de embarazo incipiente incluyen lo siguiente:  [place] leves.  Sangrado vaginal muy leve (manchado).  Cansancio inusual.  Nuseas y vmitos (nuseas matinales). Si tiene alguno de estos sntomas y sospecha que podra estar [place], puede hacerse una prueba de embarazo casera. Estas pruebas detectan la presencia de una hormona (gonadotropina corinica humana o [facility]) en la orina. El organismo de la mujer comienza a producir esta hormona al principio del Media planner. Estas pruebas son muy precisas. Espere por lo menos Management consultant de retraso de la [place]. Si la prueba indica que est embarazada (obtiene un resultado positivo), llame al mdico para concertar una cita para la atencin prenatal. Qu debo hacer si quedo embarazada?      Programe una cita con su mdico apenas sospeche que est embarazada.  No uses ningn producto que contenga nicotina, como cigarrillos, tabaco de Higher education careers adviser y Psychologist, sport and exercise. Si necesita ayuda para dejar de fumar, consulte al mdico.  No consuma bebidas alcohlicas. El alcohol se relaciona con ciertos defectos congnitos.  Evite los olores y las sustancias qumicas txicas.  Puede seguir teniendo [employer] si no le causan dolor u otros problemas, por ejemplo, sangrado vaginal. Esta informacin no tiene Marine scientist el consejo del mdico. Asegrese de hacerle al mdico cualquier pregunta que tenga. Document Released: 12/31/2012 Document Revised: 04/07/2017 Document Reviewed: 07/18/2015 Elsevier Patient Education  2020 [employer].

## 2018-11-13 NOTE — Progress Notes (Signed)
33 y.o. [id number] Single Other or two or more races Hispanic or Latino female here for annual exam.  She had an SAB in 8/20, doing okay. Wants to get pregnant again.   Period Cycle (Days): 25 Period Duration (Days): 5 Period Pattern: Regular Menstrual Flow: Moderate Menstrual Control: Maxi pad, Panty liner Menstrual Control Change Freq (Hours): 3-4 hours Dysmenorrhea: (!) Moderate Dysmenorrhea Symptoms: Headache  Patient's last menstrual period was 11/02/2018 (exact date).          Sexually active: Yes.    The current method of family planning is none.    Exercising: No.  The patient does not participate in regular exercise at present. Smoker:  no  Health Maintenance: Pap: 06/23/2016 negative, 12/18/2014 negative History of abnormal Pap:  no MMG:  10/24/2017 right breast ultrasound  Colonoscopy: Never TDaP:  09/26/2010 Gardasil: n/a   reports that she has never smoked. She has never used smokeless tobacco. She reports that she does not drink alcohol or use drugs. Just occasional ETOH. Kids are 11, 8 and 5.   Past Medical History:  Diagnosis Date  . Medical history non-contributory     Past Surgical History:  Procedure Laterality Date  . NO PAST SURGERIES      No current outpatient medications on file.   No current facility-administered medications for this visit.     Family History  Problem Relation Age of Onset  . Diabetes Mother   . Hyperlipidemia Mother   . Hypertension Mother   . Brain cancer Mother   . Uterine cancer Mother   . Stroke Father   . Diabetes Father   . Diabetes Brother     Review of Systems  All other systems reviewed and are negative.   Exam:   BP 110/68   Pulse 62   Temp (!) 97.3 F (36.3 C)   Ht 5\' 4"  (1.626 m)   Wt 183 lb (83 kg)   LMP 11/02/2018 (Exact Date)   SpO2 98%   BMI 31.41 kg/m   Weight change: @WEIGHTCHANGE @ Height:   Height: 5\' 4"  (162.6 cm)  Ht Readings from Last 3 Encounters:  11/13/18 5\' 4"  (1.626 m)  08/26/18 5\' 2"   (1.575 m)  08/02/18 5' 4.5" (1.638 m)    General appearance: alert, cooperative and appears stated age Head: Normocephalic, without obvious abnormality, atraumatic Neck: no adenopathy, supple, symmetrical, trachea midline and thyroid normal to inspection and palpation Lungs: clear to auscultation bilaterally Cardiovascular: regular rate and rhythm Breasts: normal appearance, no masses or tenderness Abdomen: soft, non-tender; non distended,  no masses,  no organomegaly Extremities: extremities normal, atraumatic, no cyanosis or edema Skin: Skin color, texture, turgor normal. No rashes or lesions Lymph nodes: Cervical, supraclavicular, and axillary nodes normal. No abnormal inguinal nodes palpated Neurologic: Grossly normal   Pelvic: External genitalia:  no lesions              Urethra:  normal appearing urethra with no masses, tenderness or lesions              Bartholins and Skenes: normal                 Vagina: normal appearing vagina with normal color and discharge, no lesions              Cervix: no cervical motion tenderness and no lesions               Bimanual Exam:  Uterus:  normal size, contour, position, consistency, mobility, non-tender  Adnexa: no masses, mildly tender on the right.                Rectovaginal: Confirms               Anus:  normal sphincter tone, no lesions  Bladder: tender  Urine dip negative.  Chaperone was present for exam.  A:  Well Woman with normal exam  Planning on conception, s/p SAB in 8/20  BMI 31  FH of DM  P:   Pap next year  Information on preparing for pregnancy given  Discussed breast self exam  Discussed calcium and vit D intake  Start PNV  Screening labs, HgbA1C, vit D

## 2018-11-14 LAB — COMPREHENSIVE METABOLIC PANEL
ALT: 28 IU/L (ref 0–32)
AST: 15 IU/L (ref 0–40)
Albumin/Globulin Ratio: 1.9 (ref 1.2–2.2)
Albumin: 4.5 g/dL (ref 3.8–4.8)
Alkaline Phosphatase: 50 IU/L (ref 39–117)
BUN/Creatinine Ratio: 9 (ref 9–23)
BUN: 7 mg/dL (ref 6–20)
Bilirubin Total: 0.4 mg/dL (ref 0.0–1.2)
CO2: 25 mmol/L (ref 20–29)
Calcium: 9.4 mg/dL (ref 8.7–10.2)
Chloride: 101 mmol/L (ref 96–106)
Creatinine, Ser: 0.75 mg/dL (ref 0.57–1.00)
GFR calc Af Amer: 123 mL/min/{1.73_m2} (ref >59)
GFR calc non Af Amer: 107 mL/min/{1.73_m2} (ref >59)
Globulin, Total: 2.4 g/dL (ref 1.5–4.5)
Glucose: 98 mg/dL (ref 65–99)
Potassium: 4 mmol/L (ref 3.5–5.2)
Sodium: 140 mmol/L (ref 134–144)
Total Protein: 6.9 g/dL (ref 6.0–8.5)

## 2018-11-14 LAB — LIPID PANEL
Chol/HDL Ratio: 4.3 ratio (ref 0.0–4.4)
Cholesterol, Total: 186 mg/dL (ref 100–199)
HDL: 43 mg/dL (ref >39)
LDL Chol Calc (NIH): 117 mg/dL — ABNORMAL HIGH (ref 0–99)
Triglycerides: 148 mg/dL (ref 0–149)
VLDL Cholesterol Cal: 26 mg/dL (ref 5–40)

## 2018-11-14 LAB — CBC
Hematocrit: 41.6 % (ref 34.0–46.6)
Hemoglobin: 13.4 g/dL (ref 11.1–15.9)
MCH: 28.1 pg (ref 26.6–33.0)
MCHC: 32.2 g/dL (ref 31.5–35.7)
MCV: 87 fL (ref 79–97)
Platelets: 263 10*3/uL (ref 150–450)
RBC: 4.77 x10E6/uL (ref 3.77–5.28)
RDW: 13.2 % (ref 11.7–15.4)
WBC: 6.4 10*3/uL (ref 3.4–10.8)

## 2018-11-14 LAB — RUBELLA SCREEN: Rubella Antibodies, IGG: 1.38 index (ref >0.99)

## 2018-11-14 LAB — HEMOGLOBIN A1C
Est. average glucose Bld gHb Est-mCnc: 123 mg/dL
Hgb A1c MFr Bld: 5.9 % — ABNORMAL HIGH (ref 4.8–5.6)

## 2018-11-14 LAB — VITAMIN D 25 HYDROXY (VIT D DEFICIENCY, FRACTURES): Vit D, 25-Hydroxy: 22.5 ng/mL — ABNORMAL LOW (ref 30.0–100.0)

## 2018-11-18 ENCOUNTER — Other Ambulatory Visit: Payer: Self-pay

## 2018-11-18 DIAGNOSIS — R7303 Prediabetes: Secondary | ICD-10-CM

## 2018-11-29 ENCOUNTER — Other Ambulatory Visit: Payer: Self-pay

## 2018-11-29 ENCOUNTER — Encounter: Payer: Self-pay | Admitting: Registered"

## 2018-11-29 ENCOUNTER — Encounter: Payer: Self-pay | Attending: Obstetrics and Gynecology | Admitting: Registered"

## 2018-11-29 DIAGNOSIS — R7303 Prediabetes: Secondary | ICD-10-CM | POA: Insufficient documentation

## 2018-11-29 NOTE — Patient Instructions (Addendum)
Consider including more green leafy vegetables and beans in diet to prepare for conception. Since you want to see if you are making progress by checking your blood sugar, you can check Fasting and 2 hours after meals. Don't get carried away with checking too much. Continue with the great changes you have made so far! Exercise, sleep and eating balanced meals.

## 2018-11-29 NOTE — Progress Notes (Signed)
Medical Nutrition Therapy:  Appt start time: 9509 end time:  1115.  This patient is accompanied in the office by her Spanish interpreter  . Assessment:  Primary concerns today: prediabetes. A1c 5.9%  Patient states she is taking prenatal vitamins because she wants to prepare for pregnancy. Pt states she is waiting to conceive because of prediabetes. Pt also reported concerns about eating shrimp because of DM and high cholesterol (per Epic LDL lab is 117 mg/dL)  Pt states she has made changes since prediabetes diagnosis. Pt reports reduce coffee intake from 3x/day to 1x/week and had caffeine withdrawal headaches. Pt reports headaches (migraine) with periods  Sleep: was sleeping 6 hrs/night; 1 week ago made a goal to sleep 8 1/2 hrs. Took TV out of bedroom. Son wakes her up ~3:30 am she may not get back to sleep until 4:30 am.   Preferred Learning Style:   No preference indicated   Learning Readiness:   Change in progress   MEDICATIONS: none   DIETARY INTAKE:  Everyday foods include 1x/week beef.  Avoided foods include pork.    24-hr recall:  B ( AM): oatmeal, 1 slice bread (x3 wks) Snk ( AM): none  L ( PM): chicken or fish 2x week, salad Snk ( PM): a little fruit and granola on a little yogurt OR nuts D ( PM): 2 tortilla, stew or soup Snk ( PM): none (cut out cake & coffee) Beverages: water, 1x week coffee before church  Usual physical activity: elliptical 20 min 5x/week. Has weights at home also.  Estimated energy needs: ~1800 calories  Progress Towards Goal(s):  In progress.   Nutritional Diagnosis:  NI-5.8.2 Excessive carbohydrate intake As related to prior intake of rice and tortillas.  As evidenced by pt reports diet before changes made 3 weeks ago.    Intervention: Nutrition education for managing blood glucose with diet and lifestyle changes. Defined the role of glucose and insulin. Described the relationship between diabetes and cardiovascular risk.  Described the  role of different macronutrients on glucose.  Explained how carbohydrates affect blood glucose.  Stated what foods contain the most carbohydrates.  Also discussed how to eat for a healthy pregnancy.  Teaching Method Utilized:  Visual Auditory  Handouts given during visit include:  Living well with diabetes  Carb card  Eathing healthy for mom's   Barriers to learning/adherence to lifestyle change: none  Demonstrated degree of understanding via:  Teach Back   Monitoring/Evaluation:  Dietary intake, exercise, a1c, and body weight prn.

## 2018-12-09 ENCOUNTER — Ambulatory Visit: Payer: Self-pay | Admitting: Registered"

## 2019-01-10 NOTE — L&D Delivery Note (Signed)
OB/GYN Faculty Practice Delivery Note  Laura Brady is a 34 y.o. [id number] s/p SVD at [redacted]w[redacted]d. She was admitted for early labor.   ROM: 5h 17m with clear fluid GBS Status:  Negative/-- (08/30 0857) Maximum Maternal Temperature: n/a  Labor Progress: . Admitted for PROM and progressed into labor. Initial SVE: 3/50/-3. She then progressed to complete without any augmentation.   Delivery Date/Time: 09/17/19 at 0308  Delivery: Called to room and patient was complete and pushing. After pushing over 5 contractions, head delivered LOA. No nuchal cord present. Shoulder and body delivered in usual fashion. Infant with spontaneous cry, placed on mother's abdomen, dried and stimulated. Cord clamped x 2 after 1-minute delay, and cut by FOB. Cord blood drawn. Placenta delivered spontaneously with gentle cord traction. Fundus firm with massage and Pitocin. Labia, perineum, vagina, and cervix inspected inspected with right sided vaginal wall abrasion, hemostatic and not requiring repair.  Baby Weight: pending  Placenta: Sent to L&D Complications: None\ Lacerations: right vaginal wall abrasion, hemostatic and not repaired  EBL: 100 mL Analgesia: Epidural    Infant:  APGAR (1 MIN):  pending APGAR (5 MINS):   APGAR (10 MINS):     Casper Harrison, MD [facility] Family Medicine Fellow, [facility] for [facility], [facility] 09/17/2019, 3:18 AM

## 2019-02-11 ENCOUNTER — Encounter: Payer: Self-pay | Admitting: Emergency Medicine

## 2019-02-11 ENCOUNTER — Ambulatory Visit (INDEPENDENT_AMBULATORY_CARE_PROVIDER_SITE_OTHER): Payer: Self-pay | Admitting: Emergency Medicine

## 2019-02-11 ENCOUNTER — Other Ambulatory Visit: Payer: Self-pay

## 2019-02-11 VITALS — BP 119/70 | HR 62 | Temp 97.6°F | Resp 16 | Ht 65.0 in | Wt 184.0 lb

## 2019-02-11 DIAGNOSIS — Z8616 Personal history of COVID-19: Secondary | ICD-10-CM | POA: Insufficient documentation

## 2019-02-11 DIAGNOSIS — N912 Amenorrhea, unspecified: Secondary | ICD-10-CM

## 2019-02-11 DIAGNOSIS — Z3491 Encounter for supervision of normal pregnancy, unspecified, first trimester: Secondary | ICD-10-CM

## 2019-02-11 HISTORY — DX: Personal history of COVID-19: Z86.16

## 2019-02-11 NOTE — Progress Notes (Signed)
Laura Brady 34 y.o.   Chief Complaint  Patient presents with  . Amenorrhea    since 12/27/2018 last period    HISTORY OF PRESENT ILLNESS: This is a 34 y.o. female last menstrual period 12/27/2018, positive home urine pregnancy tests, needs blood pregnancy test to get into prenatal care clinic.  No complaints. Had Covid infection last November.  HPI   Prior to Admission medications   Medication Sig Start Date End Date Taking? Authorizing Provider  Prenatal Vit-Fe Fumarate-FA (PRENATAL MULTIVITAMIN) TABS tablet Take 1 tablet by mouth daily at 12 noon.   Yes [provider]  Cholecalciferol (VITAMIN D3 PO) Take by mouth.    [provider]    No Known Allergies  Patient Active Problem List   Diagnosis Date Noted  . Family history of diabetes mellitus (DM) 11/13/2018    Past Medical History:  Diagnosis Date  . Medical history non-contributory     Past Surgical History:  Procedure Laterality Date  . NO PAST SURGERIES      Social History   Socioeconomic History  . Marital status: Single    Spouse name: Not on file  . Number of children: Not on file  . Years of education: Not on file  . Highest education level: Not on file  Occupational History  . Not on file  Tobacco Use  . Smoking status: Never Smoker  . Smokeless tobacco: Never Used  Substance and Sexual Activity  . Alcohol use: No  . Drug use: No  . Sexual activity: Yes    Partners: Male    Birth control/protection: None  Other Topics Concern  . Not on file  Social History Narrative   Marital status: dating seriously x 9 years      Children: 3 children (9, 44,1)      Lives: with boyfriend, 3 chi.ldren      Employment: unemployed      Tobacco: none      Alcohol: weekends/once per month      Drugs: none      Exercise:  Some; gym membership.      Sexual activity: no STDs.         Social Determinants of Health   Financial Resource Strain:   . Difficulty of Paying  Living Expenses: Not on file  Food Insecurity:   . Worried About Programme researcher, broadcasting/film/video in the Last Year: Not on file  . Ran Out of Food in the Last Year: Not on file  Transportation Needs:   . Lack of Transportation (Medical): Not on file  . Lack of Transportation (Non-Medical): Not on file  Physical Activity:   . Days of Exercise per Week: Not on file  . Minutes of Exercise per Session: Not on file  Stress:   . Feeling of Stress : Not on file  Social Connections:   . Frequency of Communication with Friends and Family: Not on file  . Frequency of Social Gatherings with Friends and Family: Not on file  . Attends Religious Services: Not on file  . Active Member of Clubs or Organizations: Not on file  . Attends Banker Meetings: Not on file  . Marital Status: Not on file  Intimate Partner Violence:   . Fear of Current or Ex-Partner: Not on file  . Emotionally Abused: Not on file  . Physically Abused: Not on file  . Sexually Abused: Not on file    Family History  Problem Relation Age of Onset  .  Diabetes Mother   . Hyperlipidemia Mother   . Hypertension Mother   . Brain cancer Mother   . Uterine cancer Mother   . Stroke Father   . Diabetes Father   . Diabetes Brother      Review of Systems  Constitutional: Negative.  Negative for chills and fever.  HENT: Negative.  Negative for congestion and sore throat.   Respiratory: Negative.  Negative for cough and shortness of breath.   Cardiovascular: Negative.  Negative for chest pain and palpitations.  Gastrointestinal: Negative.  Negative for abdominal pain, diarrhea, nausea and vomiting.  Genitourinary: Negative.  Negative for dysuria and hematuria.  Musculoskeletal: Negative.  Negative for myalgias and neck pain.  Skin: Negative.  Negative for rash.  Neurological: Negative.  Negative for dizziness and headaches.  All other systems reviewed and are negative.  Today's Vitals   02/11/19 1339  BP: 119/70  Pulse: 62   Resp: 16  Temp: 97.6 F (36.4 C)  TempSrc: Temporal  SpO2: 99%  Weight: 184 lb (83.5 kg)  Height: 5\' 5"  (1.651 m)   Body mass index is 30.62 kg/m.   Physical Exam Vitals reviewed.  Constitutional:      Appearance: Normal appearance.  HENT:     Head: Normocephalic.  Eyes:     Extraocular Movements: Extraocular movements intact.     Pupils: Pupils are equal, round, and reactive to light.  Cardiovascular:     Rate and Rhythm: Normal rate.  Pulmonary:     Effort: Pulmonary effort is normal.  Musculoskeletal:        General: Normal range of motion.     Cervical back: Normal range of motion.  Skin:    General: Skin is warm and dry.  Neurological:     General: No focal deficit present.     Mental Status: She is alert and oriented to person, place, and time.  Psychiatric:        Mood and Affect: Mood normal.        Behavior: Behavior normal.      ASSESSMENT & PLAN: Laura Brady was seen today for amenorrhea.  Diagnoses and all orders for this visit:  First trimester pregnancy -     Beta hCG quant (ref lab)  History of COVID-19    Patient Instructions       If you have lab work done today you will be contacted with your lab results within the next 2 weeks.  If you have not heard from Desma Maxim then please contact us. The fastest way to get your results is to register for My Chart.   IF you received an x-ray today, you will receive an invoice from [facility]. Please contact [facility] at [phone] with questions or concerns regarding your invoice.   IF you received labwork today, you will receive an invoice from [place]. Please contact [facility] at [phone] with questions or concerns regarding your invoice.   Our billing staff will not be able to assist you with questions regarding bills from these companies.  You will be contacted with the lab results as soon as they are available. The fastest way to get your results is to activate your My  Chart account. Instructions are located on the last page of this paperwork. If you have not heard from [phone] regarding the results in 2 weeks, please contact this office.     Primer trimestre de [place] First Trimester of Pregnancy  El primer trimestre de Psychiatrist se extiende desde la semana1 Psychiatrist  el final de la semana13 (mes1 al mes3). Durante este tiempo, el beb comenzar a desarrollarse dentro suyo. Entre la semana6 y [place], se forman los ojos y Financial risk analyst, y los latidos del corazn pueden escucharse en la ecografa. Al final de las 12semanas, todos los rganos del beb estn formados. El cuidado prenatal es toda la asistencia mdica que usted recibe antes del nacimiento del beb. Asegrese de recibir un buen cuidado prenatal y de seguir todas las indicaciones del mdico. Siga estas indicaciones en su casa: [place] los medicamentos de venta libre y los recetados solamente como se lo haya indicado el mdico. Algunos medicamentos son seguros para tomar durante el Media planner y otros no lo son.  Tome vitaminas prenatales que contengan por lo menos [id number] (?g) de cido flico.  Si tiene problemas para defecar (estreimiento), tome un medicamento que ablanda la materia fecal (laxante), siempre que lo autorice el mdico. Comida y bebida   Ingiera alimentos saludables de [place] regular.  El Buyer, retail la cantidad de peso que [place].  No coma carne cruda ni quesos sin cocinar.  Si tiene Higher education careers adviser (nuseas) o vomita (vmitos): ? Ingiera 4 o 5comidas pequeas por TEFL teacher de 3abundantes. ? Intente comer algunas galletitas saladas. ? Beba lquidos [employer], en lugar de [employer].  Para evitar el estreimiento: ? Consuma alimentos ricos en fibra, como frutas y verduras frescas, cereales integrales y legumbres. ? Beba suficiente lquido para mantener el pis (orina) claro o de color amarillo plido. Actividad  Haga  ejercicios solamente como se lo haya indicado el mdico. Deje de hacer ejercicios si tiene clicos o dolor en la parte baja del vientre (abdomen) o en la cintura.  No haga actividad fsica si el clima est demasiado caluroso o hmedo, o si se encuentra en un lugar muy alto (altitud elevada).  Intente no estar de pie [employer]. Mueva las piernas con frecuencia si debe estar de pie en un lugar durante mucho tiempo.  Evite levantar pesos [employer].  Use zapatos con tacones bajos. Mantenga una buena postura al sentarse y pararse.  Puede tener [employer], a menos que el mdico le indique lo contrario. Alivio del dolor y del Tree surgeon  Use un sostn que le brinde buen soporte si le duelen las [place].  Dese baos de asiento con agua tibia para Best boy o las molestias causadas por las hemorroides. Use una crema antihemorroidal si el mdico se lo permite.  Descanse con las piernas elevadas si tiene calambres o dolor de cintura.  Si tiene las venas de las piernas hinchadas y abultadas (venas varicosas): ? Use medias elsticas de soporte o medias de compresin como se lo haya indicado el mdico. ? Levante (eleve) los pies durante 49minutos, 3 o 4veces por Training and development officer. ? Limite la sal en sus alimentos. Cuidado prenatal  Programe las visitas prenatales para la semana12 de [place].  Escriba sus preguntas. Llvelas cuando concurra a las visitas prenatales.  Concurra a todas las visitas prenatales como se lo haya indicado el mdico. Esto es importante. Seguridad  Use el cinturn de seguridad en todo momento mientras conduce.  Haga una lista con los nmeros de telfono en caso de Freight forwarder. Esta lista debe incluir los nmeros de los familiares, los amigos, el hospital y los departamentos de polica y de bomberos. Instrucciones generales  Pdale al mdico que la derive a clases prenatales en su localidad. Debe comenzar a tomar las clases antes de Dietitian  en el mes6 de  embarazo.  Pida ayuda si necesita asesoramiento o asistencia con la alimentacin. El mdico puede aconsejarla o indicarle dnde recurrir para recibir [place].  No se d baos de inmersin en agua caliente, baos turcos ni saunas.  No se haga duchas vaginales ni use tampones o toallas higinicas perfumadas.  No mantenga las piernas cruzadas durante [place].  Evite las hierbas y el alcohol. Evite los frmacos que el mdico no haya autorizado.  No consuma ningn producto que contenga tabaco, lo que incluye cigarrillos, tabaco de Theatre manager o Administrator, Civil Service. Si necesita ayuda para dejar de fumar, consulte al [employer]. Puede recibir asesoramiento u otro tipo de apoyo para dejar de fumar.  Evite el contacto con las bandejas sanitarias de los gatos y la tierra que estos animales usan. Estos elementos contienen grmenes que pueden causar defectos congnitos al beb y la posible prdida del feto (aborto espontneo) o muerte fetal.  Visite al dentista. En su casa, lvese los dientes con un cepillo dental suave. Psese el hilo dental con suavidad. Comunquese con un mdico si:  Tiene mareos.  Tiene clicos leves o siente presin en la parte baja del vientre.  Sufre un dolor persistente en el abdomen.  Sigue teniendo [employer], vomita o la materia fecal es lquida (diarrea).  Nota una secrecin de lquido con olor ftido que proviene de la vagina.  Tiene dolor al hacer pis (orinar).  Tiene el rostro, las [place], las piernas o los tobillos ms hinchados (inflamados). Solicite ayuda de inmediato si:  Tiene fiebre.  Tiene una prdida de lquido por la vagina.  Tiene sangrado o pequeas prdidas vaginales.  Tiene clicos o dolor muy intensos en el vientre.  Sube o baja de peso rpidamente.  Vomita sangre. Esto tiene Art gallery manager similar a la borra del caf.  Est en contacto con personas que tienen rubola, la quinta enfermedad o varicela.  Siente un dolor de cabeza muy  intenso.  Le falta el aire.  Sufre cualquier tipo de traumatismo, por ejemplo, debido a una cada o un accidente automovilstico. Resumen  El primer trimestre de Psychiatrist se extiende desde la semana1 hasta el final de la semana13 (mes1 al mes3).  Para cuidar su salud y la del beb en gestacin, necesitar consumir alimentos saludables, tomar medicamentos solamente si lo autoriza el mdico, y Radio producer actividades que sean seguras para usted y para su beb.  Concurra a todas las visitas de control como se lo haya indicado el mdico. Esto es importante porque el mdico deber asegurar que el beb est saludable y est creciendo bien. Esta informacin no tiene Theme park manager el consejo del mdico. Asegrese de hacerle al mdico cualquier pregunta que tenga. Document Revised: 08/01/2016 Document Reviewed: 08/01/2016 Elsevier Patient Education  2020 Elsevier Inc.      Edwina Barth, MD [facility]

## 2019-02-11 NOTE — Patient Instructions (Addendum)
If you have lab work done today you will be contacted with your lab results within the next 2 weeks.  If you have not heard from [place] then please contact us. The fastest way to get your results is to register for My Chart.   IF you received an x-ray today, you will receive an invoice from [facility]. Please contact [facility] at [phone] with questions or concerns regarding your invoice.   IF you received labwork today, you will receive an invoice from [place]. Please contact [facility] at [phone] with questions or concerns regarding your invoice.   Our billing staff will not be able to assist you with questions regarding bills from these companies.  You will be contacted with the lab results as soon as they are available. The fastest way to get your results is to activate your My Chart account. Instructions are located on the last page of this paperwork. If you have not heard from [place] regarding the results in 2 weeks, please contact this office.     Primer trimestre de Media planner First Trimester of Pregnancy  El primer trimestre de Media planner se extiende desde la semana1 hasta el final de la semana13 (mes1 al mes3). Durante este tiempo, el beb comenzar a desarrollarse dentro suyo. Entre la semana6 y [place], se forman los ojos y Financial risk analyst, y los latidos del corazn pueden escucharse en la ecografa. Al final de las 12semanas, todos los rganos del beb estn formados. El cuidado prenatal es toda la asistencia mdica que usted recibe antes del nacimiento del beb. Asegrese de recibir un buen cuidado prenatal y de seguir todas las indicaciones del mdico. Siga estas indicaciones en su casa: [place] los medicamentos de venta libre y los recetados solamente como se lo haya indicado el mdico. Algunos medicamentos son seguros para tomar durante el Media planner y otros no lo son.  Tome vitaminas prenatales que contengan por lo menos [id number] (?g) de  cido flico.  Si tiene problemas para defecar (estreimiento), tome un medicamento que ablanda la materia fecal (laxante), siempre que lo autorice el mdico. Comida y bebida   Ingiera alimentos saludables de [place] regular.  El Buyer, retail la cantidad de peso que [place].  No coma carne cruda ni quesos sin cocinar.  Si tiene Higher education careers adviser (nuseas) o vomita (vmitos): ? Ingiera 4 o 5comidas pequeas por TEFL teacher de 3abundantes. ? Intente comer algunas galletitas saladas. ? Beba lquidos [employer], en lugar de [employer].  Para evitar el estreimiento: ? Consuma alimentos ricos en fibra, como frutas y verduras frescas, cereales integrales y legumbres. ? Beba suficiente lquido para mantener el pis (orina) claro o de color amarillo plido. Actividad  Haga ejercicios solamente como se lo haya indicado el mdico. Deje de hacer ejercicios si tiene clicos o dolor en la parte baja del vientre (abdomen) o en la cintura.  No haga actividad fsica si el clima est demasiado caluroso o hmedo, o si se encuentra en un lugar muy alto (altitud elevada).  Intente no estar de pie [employer]. Mueva las piernas con frecuencia si debe estar de pie en un lugar durante mucho tiempo.  Evite levantar pesos [employer].  Use zapatos con tacones bajos. Mantenga una buena postura al sentarse y pararse.  Puede tener [employer], a menos que el mdico le indique lo contrario. Alivio del dolor y del Tree surgeon  Use un sostn que le brinde buen soporte si le duelen las  mamas.  Dese baos de asiento con agua tibia para Best boy o las molestias causadas por las hemorroides. Use una crema antihemorroidal si el mdico se lo permite.  Descanse con las piernas elevadas si tiene calambres o dolor de cintura.  Si tiene las venas de las piernas hinchadas y abultadas (venas varicosas): ? Use medias elsticas de soporte o medias de compresin como  se lo haya indicado el mdico. ? Levante (eleve) los pies durante 77minutos, 3 o 4veces por Training and development officer. ? Limite la sal en sus alimentos. Cuidado prenatal  Programe las visitas prenatales para la semana12 de [place].  Escriba sus preguntas. Llvelas cuando concurra a las visitas prenatales.  Concurra a todas las visitas prenatales como se lo haya indicado el mdico. Esto es importante. Seguridad  Use el cinturn de seguridad en todo momento mientras conduce.  Haga una lista con los nmeros de telfono en caso de Freight forwarder. Esta lista debe incluir los nmeros de los familiares, los amigos, el hospital y los departamentos de polica y de bomberos. Instrucciones generales  Pdale al mdico que la derive a clases prenatales en su localidad. Debe comenzar a tomar las clases antes de Dietitian en el mes6 de embarazo.  Pida ayuda si necesita asesoramiento o asistencia con la alimentacin. El mdico puede aconsejarla o indicarle dnde recurrir para recibir [place].  No se d baos de inmersin en agua caliente, baos turcos ni saunas.  No se haga duchas vaginales ni use tampones o toallas higinicas perfumadas.  No mantenga las piernas cruzadas durante mucho tiempo.  Evite las hierbas y el alcohol. Evite los frmacos que el mdico no haya autorizado.  No consuma ningn producto que contenga tabaco, lo que incluye cigarrillos, tabaco de Higher education careers adviser o Psychologist, sport and exercise. Si necesita ayuda para dejar de fumar, consulte al [employer]. Puede recibir asesoramiento u otro tipo de apoyo para dejar de fumar.  Evite el contacto con las bandejas sanitarias de los gatos y la tierra que estos animales usan. Estos elementos contienen grmenes que pueden causar defectos congnitos al beb y la posible prdida del feto (aborto espontneo) o muerte fetal.  Visite al dentista. En su casa, lvese los dientes con un cepillo dental suave. Psese el hilo dental con suavidad. Comunquese con un mdico si:  Tiene  mareos.  Tiene clicos leves o siente presin en la parte baja del vientre.  Sufre un dolor persistente en el abdomen.  Sigue teniendo [employer], vomita o la materia fecal es lquida (diarrea).  Nota una secrecin de lquido con olor ftido que proviene de la vagina.  Tiene dolor al hacer pis (orinar).  Tiene el rostro, las [place], las piernas o los tobillos ms hinchados (inflamados). Solicite ayuda de inmediato si:  Tiene fiebre.  Tiene una prdida de lquido por la vagina.  Tiene sangrado o pequeas prdidas vaginales.  Tiene clicos o dolor muy intensos en el vientre.  Sube o baja de peso rpidamente.  Vomita sangre. Esto tiene Chiropodist similar a la borra del caf.  Est en contacto con personas que tienen rubola, la quinta enfermedad o varicela.  Siente un dolor de cabeza muy intenso.  Le falta el aire.  Sufre cualquier tipo de traumatismo, por ejemplo, debido a una cada o un accidente automovilstico. Resumen  El primer trimestre de Media planner se extiende desde la semana1 hasta el final de la semana13 (mes1 al mes3).  Para cuidar su salud y la del beb en gestacin, necesitar consumir alimentos saludables, tomar medicamentos solamente si lo autoriza  el mdico, y hacer actividades que sean seguras para usted y para su beb.  Concurra a todas las visitas de control como se lo haya indicado el mdico. Esto es importante porque el mdico deber asegurar que el beb est saludable y est creciendo bien. Esta informacin no tiene Theme park manager el consejo del mdico. Asegrese de hacerle al mdico cualquier pregunta que tenga. Document Revised: 08/01/2016 Document Reviewed: 08/01/2016 Elsevier Patient Education  2020 [employer].

## 2019-02-12 ENCOUNTER — Encounter: Payer: Self-pay | Admitting: Radiology

## 2019-02-12 LAB — BETA HCG QUANT (REF LAB): hCG Quant: 28926 m[IU]/mL

## 2019-03-06 ENCOUNTER — Other Ambulatory Visit: Payer: Self-pay

## 2019-03-06 ENCOUNTER — Ambulatory Visit (INDEPENDENT_AMBULATORY_CARE_PROVIDER_SITE_OTHER): Payer: Self-pay | Admitting: *Deleted

## 2019-03-06 DIAGNOSIS — Z3490 Encounter for supervision of normal pregnancy, unspecified, unspecified trimester: Secondary | ICD-10-CM | POA: Insufficient documentation

## 2019-03-06 DIAGNOSIS — Z8616 Personal history of COVID-19: Secondary | ICD-10-CM

## 2019-03-06 NOTE — Progress Notes (Signed)
Patient seen and assessed by nursing staff during this encounter. I have reviewed the chart and agree with the documentation and plan.  Vonzella Nipple, PA-C 03/06/2019 6:49 PM

## 2019-03-06 NOTE — Patient Instructions (Signed)

## 2019-03-06 NOTE — Progress Notes (Signed)
I connected with  Laura Brady on 03/06/19 at  3:30 PM EST by telephone and verified that I am speaking with the correct person using two identifiers.   I discussed the limitations, risks, security and privacy concerns of performing an evaluation and management service by telephone and the availability of in person appointments. I also discussed with the patient that there may be a patient responsible charge related to this service. The patient expressed understanding and agreed to proceed.  I explained I am completing her New OB Intake today.She is a [employer] referral.  We discussed Her EDD and that it is based on  sure LMP . I reviewed her allergies, meds, OB History, Medical /Surgical history, and appropriate screenings. I informed her of [facility] services.I explained we will give her a a blood pressure cuff at her first new ob visit.  And we will show her  how to use it. Explained  then we will have her take her blood pressure weekly. I explained she will have some visits in office and some virtually. I explained we will help her download app for virtual visits at her new ob visit.  I reviewed her new ob  appointment date/ time with her , our location and to wear mask, no visitors.  I explained she will have a pelvic exam, ob bloodwork, hemoglobin a1C, cbg ,pap, and  genetic testing if desired,- she does want a panorama. I Informed her we will schedule an [place] for  19 weeks with [place]  Once their schedule is open for April . She voices understanding.   Mada Sadik,RN 03/06/2019  3:34 PM

## 2019-03-25 ENCOUNTER — Other Ambulatory Visit: Payer: Self-pay

## 2019-03-25 ENCOUNTER — Ambulatory Visit (INDEPENDENT_AMBULATORY_CARE_PROVIDER_SITE_OTHER): Payer: Medicaid Other | Admitting: Medical

## 2019-03-25 VITALS — BP 116/77 | HR 66 | Wt 184.4 lb

## 2019-03-25 DIAGNOSIS — Z3A12 12 weeks gestation of pregnancy: Secondary | ICD-10-CM

## 2019-03-25 DIAGNOSIS — Z1151 Encounter for screening for human papillomavirus (HPV): Secondary | ICD-10-CM

## 2019-03-25 DIAGNOSIS — O09291 Supervision of pregnancy with other poor reproductive or obstetric history, first trimester: Secondary | ICD-10-CM

## 2019-03-25 DIAGNOSIS — O99211 Obesity complicating pregnancy, first trimester: Secondary | ICD-10-CM

## 2019-03-25 DIAGNOSIS — Z8616 Personal history of COVID-19: Secondary | ICD-10-CM

## 2019-03-25 DIAGNOSIS — Z8639 Personal history of other endocrine, nutritional and metabolic disease: Secondary | ICD-10-CM

## 2019-03-25 DIAGNOSIS — Z8632 Personal history of gestational diabetes: Secondary | ICD-10-CM

## 2019-03-25 DIAGNOSIS — Z124 Encounter for screening for malignant neoplasm of cervix: Secondary | ICD-10-CM | POA: Diagnosis present

## 2019-03-25 DIAGNOSIS — Z3491 Encounter for supervision of normal pregnancy, unspecified, first trimester: Secondary | ICD-10-CM

## 2019-03-25 DIAGNOSIS — Z113 Encounter for screening for infections with a predominantly sexual mode of transmission: Secondary | ICD-10-CM | POA: Diagnosis not present

## 2019-03-25 DIAGNOSIS — O99210 Obesity complicating pregnancy, unspecified trimester: Secondary | ICD-10-CM | POA: Insufficient documentation

## 2019-03-25 DIAGNOSIS — O09299 Supervision of pregnancy with other poor reproductive or obstetric history, unspecified trimester: Secondary | ICD-10-CM | POA: Insufficient documentation

## 2019-03-25 LAB — POCT URINALYSIS DIP (DEVICE)
Bilirubin Urine: NEGATIVE
Glucose, UA: NEGATIVE mg/dL
Hgb urine dipstick: NEGATIVE
Ketones, ur: NEGATIVE mg/dL
Leukocytes,Ua: NEGATIVE
Nitrite: NEGATIVE
Protein, ur: NEGATIVE mg/dL
Specific Gravity, Urine: 1.025 (ref 1.005–1.030)
Urobilinogen, UA: 0.2 mg/dL (ref 0.0–1.0)
pH: 5.5 (ref 5.0–8.0)

## 2019-03-25 NOTE — Progress Notes (Signed)
   PRENATAL VISIT NOTE  Subjective:  Laura Brady is a 34 y.o. G5P3013 at [redacted]w[redacted]d being seen today for her first prenatal visit for this pregnancy.  She is currently monitored for the following issues for this high-risk pregnancy and has Family history of diabetes mellitus (DM); History of COVID-19; Supervision of low-risk pregnancy; Obesity affecting pregnancy in first trimester; and History of gestational diabetes in prior pregnancy, currently pregnant in first trimester on their problem list.  Patient reports no complaints.  Contractions: Not present. Vag. Bleeding: None.  Movement: Absent. Denies leaking of fluid.   She is planning to breastfeed. Desires BTL or vasectomy for contraception.   The following portions of the patient's history were reviewed and updated as appropriate: allergies, current medications, past family history, past medical history, past social history, past surgical history and problem list.   Objective:   Vitals:   03/25/19 1347  BP: 116/77  Pulse: 66  Weight: 184 lb 6.4 oz (83.6 kg)    Fetal Status: Fetal Heart Rate (bpm): 162   Movement: Absent     General:  Alert, oriented and cooperative. Patient is in no acute distress.  Skin: Skin is warm and dry. No rash noted.   Cardiovascular: Normal heart rate and rhythm noted  Respiratory: Normal respiratory effort, no problems with respiration noted. Clear to auscultation.   Abdomen: Soft, gravid, appropriate for gestational age. Normal bowel sounds. Non-tender. Pain/Pressure: Present     Pelvic: Cervical exam performed Dilation: Closed Effacement (%): Thick   Normal cervical contour, no lesions, no bleeding following pap, normal discharge Breast: symmetric, no masses, no nipple discharge or bleeding   Extremities: Normal range of motion.  Edema: None  Mental Status: Normal mood and affect. Normal behavior. Normal judgment and thought content.   Assessment and Plan:  Pregnancy: G5P3013 at [redacted]w[redacted]d 1.  Encounter for supervision of low-risk pregnancy in first trimester - Culture, OB Urine - Obstetric Panel, Including HIV - Cytology - PAP( [place]) - VITAMIN D 25 Hydroxy (Vit-D Deficiency, Fractures)  2. Obesity affecting pregnancy in first trimester - BASA recommended daily until delivery  - Hemoglobin A1c - Comp Met (CMET)  3. History of gestational diabetes in prior pregnancy, currently pregnant in first trimester - BASA recommended until delivery  - Patient state was diet controlled, unsure if it was second or third pregnancy  - Hemoglobin A1c - Comp Met (CMET)  4. History of COVID-19 - February 2021  Preterm labor/ first trimester warning symptoms and general obstetric precautions including but not limited to vaginal bleeding, contractions, leaking of fluid and fetal movement were reviewed in detail with the patient. Please refer to After Visit Summary for other counseling recommendations.   Return in about 4 weeks (around 04/22/2019) for LOB, Virtual.  Future Appointments  Date Time Provider Department Center  04/22/2019  2:15 PM Dawson, Rolitta, CNM [facility]  11/19/2019  8:00 AM Jertson, Jill Evelyn, MD [facility] None    Julie Wenzel, PA-C  

## 2019-03-25 NOTE — Patient Instructions (Addendum)
AREA PEDIATRIC/FAMILY PRACTICE PHYSICIANS  [place] ([address]) . [facility] o Chambliss, MD; Eniola, MD; Hale, MD; Hensel, MD; McDiarmid, MD; McIntyer, MD; Heliodoro Domagalski, MD; Walden, MD o [address] [place], [place] [address] o [phone] o Mon-Fri 8:30-12:30, 1:30-5:00 o Providers come to see babies at [facility] o Accepting Medicaid . [facility] o Limited providers who accept newborns: Koirala, MD; Morrow, MD; Wolters, MD o [address], [place], [place] [address] o [phone] o Mon-Fri 8:00-5:30 o Babies seen by providers at [facility] o Does NOT accept Medicaid o Please call early in hospitalization for appointment (limited availability)  . [facility] o Mulberry, MD o [address] [place], [place] [address] o [phone] o Mon, Tue, Thur, Fri 8:30-5:00, Wed 10:00-7:00 (closed 1-2pm) o Babies seen by [facility] providers o Accepting Medicaid . [facility] o Rubin, MD o [address], [place], [place] [address] o [phone] o Mon-Fri 8:30-5:00, Sat 8:30-12:00 o Provider comes to see babies at [facility] o Accepting Medicaid o Must have been referred from current patients or contacted office prior to delivery . [facility] ([facility]) o Brown, MD; Chandler, MD; Ettefagh, MD; Grant, MD; Lester, MD; McCormick, MD; McQueen, MD; Prose, MD; Simha, MD; Stanley, MD; Stryffeler, NP; Tebben, NP o [address], [place], [place] [address] o [phone] o Mon, Tue, Thur, Fri 8:30-5:30, Wed 9:30-5:30, Sat 8:30-12:30 o Babies seen by [facility] providers o Accepting Medicaid o Only accepting infants of first-time parents or siblings of current patients o Hospital discharge coordinator will make follow-up appointment . Jack Amos o [address],  [place], [place]  [address] o [phone]   Fax - [phone] . [facility] o [address], [place], [place]  [address] o Phone - [phone]   Fax - [phone] . Shilpa Gosrani o [address], [place], [place]  [address] o [phone]  [place] ([address]) . [facility] o Bates, MD; [place], MD; Cooper, Cox, MD; MD; Davis, MD; Dovico, MD; Ettefaugh, MD; Little, MD; Lowe, MD; Keiffer, MD; Melvin, MD; Sumner, MD; Williams, MD o [address], [place], [place] [address] o [phone] o Mon-Fri 8:30-5:00 (extended evenings Mon-Thur as needed), Sat-Sun 10:00-1:00 o Providers come to see babies at [facility] o Accepting Medicaid for families of first-time babies and families with all children in the household age 3 and under. Must register with office prior to making appointment (M-F only). . [facility] o Henson, NP; Knapp, MD; Lalonde, MD; Tysinger, PA o [address] [place], [place] [address] o [phone] o Mon-Fri 8:00-5:00 o Babies seen by providers at [facility] o Does NOT accept Medicaid/Commercial Insurance Only . [facility] ([facility]  o Artis, MD; Barnes, MD; Bratton, MD; Coccaro, MD; Lockett Gardner, MD; Kramer, MD; Marshall, MD; Netherton, MD; Poleto, MD; Skinner, MD o [address] [place], [place] [address] o [phone] o Mon-Fri 8:30-5:30, Sat (Oct.-Mar.) 9:00-1:00 o Babies seen by providers at [facility] o Accepting Medicaid  [place] ([address]) . [facility] o Reid, MD; Warner, MD o [address], [place],  [address] o [phone] o Mon-Fri 8:30-5:00, Sat 8:30-12:00 o Providers come to see babies at [facility] o Does NOT accept Medicaid . [facility] o Becker, PA; Hagler, MD; Scifres, PA; Sun, MD; Swayne, MD o [address],  [place], [place] [address] o [phone] o Mon-Fri 8:00-5:00 o Babies seen by providers at [facility] o Does NOT accept Medicaid o Only accepting babies of parents who are patients o Please call early in hospitalization for appointment (limited availability) . [facility] o Clark, MD; Frye, MD; Kelleher, MD; Mack, NP; Miller, MD; O'Keller, MD; Patterson, NP; Pudlo, MD; Puzio, MD; Thomas, MD; Tucker, MD; Twiselton, MD o [address], [place], [place] [address] o [phone] o Mon-Fri 8:00-5:00, Sat 9:00-12:00 o Providers come to see babies at [facility] o Does NOT accept Medicaid  [place] ([address]) . [facility] o Limited providers accepting new patients: Brake, NP; Wharton, PA o [address], [place], [place] [address] o [phone] o Mon-Fri 8:00-5:00 o Babies seen by providers at [facility] o Does NOT accept Medicaid o Only accepting babies of parents who are patients o Please call early in hospitalization for appointment (limited availability) . [facility] o Gay, MD; Quinlan, MD o [address] [place], [place] [address] o [phone] (press [phone] to schedule appointment) o Mon-Fri 8:00-5:00 o Providers come to see babies at [facility] o Does NOT accept Medicaid . [facility] o Mazer, MD o [address] [place], [place] [address] o [phone] o Mon-Fri 8:30-5:00 (lunch 12:30-1:00), extended hours by appointment only Wed 5:00-6:30 o Babies seen by [facility] providers o Accepting Medicaid . [facility] o Banks, MD; Jordan, MD; Koberlein, MD o [address], [place], [place] [address] o [phone] o Mon-Fri 8:00-5:00 o Babies seen by [facility] providers o Does NOT accept Medicaid . [facility] o Parker, MD; Hunter, MD; Wallace, DO o [address] [place], [place]  [address] o [phone] o Mon-Fri 8:00-5:00 o Babies seen by [facility] providers o Does NOT accept Medicaid . [facility] o Brandon, PA; Brecken, PA; Christy, NP; Dees, MD; DeClaire, MD; DeWeese, MD; Hansen, NP; Mills, NP; Parrish, NP; Smoot, NP; Summer, MD; Vapne, MD o [address] [place], [place] [address] o [phone] o Mon-Fri 8:30-5:00, Sat 10:00-1:00 o Providers come to see babies at [facility] o Does NOT accept Medicaid o Free prenatal information session Tuesdays at 4:45pm . [facility] o Bouska, MD; Gordon, PA; Jeffery, PA; Weber, PA o [address] [place] [address] o [phone] o Mon-Fri 7:30-5:30 o Babies seen by [facility] providers . [facility] o [address], [place], [place], [place]  [address] o [phone]   Fax - [phone]  [place] ([phone]) . [facility] o Reese, MD o [address] [place], [place] [address] o [phone] o Mon-Thur 8:00-6:00 o Providers come to see babies at [facility] o Accepting Medicaid . [facility] o Anderson, NP; Badger, MD; Beal, PA; Spencer, PA o [address] [place],  [address] o [phone] o Mon-Thur 7:30-7:30, Fri 7:30-4:30 o Babies seen by [facility] providers o Accepting Medicaid . [facility] o Agbuya, MD; Klett, NP; Romgoolam, MD o [address], ,  [address] o [phone] o Mon-Fri 8:30-5:00, Sat 8:30-12:00 o Providers come to see babies at [facility] o Accepting Medicaid o Must have "Meet & Greet" appointment at office prior to delivery . [facility]  ([facility] ) o McCord,   MD; Juleen China, MD; Clydene Laming, [address] [address], [place], [place] [address] o [phone] o Mon-Wed 8:00-6:00, Thur-Fri 8:00-5:00, Sat 9:00-12:00 o Providers come to  see babies at [facility] o Does NOT accept Medicaid o Only accepting siblings of current patients . [facility] [place], [place], [place], [place]  [address] o [phone]   Fax [phone] . [facility] [address] [place] [address], [place], [place]  [address] o [phone]   Fax - Morton Gorman [phone] & [phone]) . Therapist, music at [place], DO; [place], [address] [place], [place] [address] o [phone] o Mon-Fri 7:00-5:00 o Babies seen by [facility] providers o Does NOT accept Medicaid . [place], MD; [place], [place]; [place], [place], [place], [place] [address] o [phone] o Mon-Fri 8:00-5:00 o Babies seen by [facility] providers o Accepting Medicaid . Lamont, MD; [place], [place]; [place], NP; [place], [place], [place], [place] [address] o [phone] o Mon-Fri 8:00-5:00 o Babies seen by providers at [facility] [place] [phone]) . [facility] [place], [place] o [address] [place], [place] [address] o [phone] o Mon-Fri 8:00-5:00 o Babies seen by [facility] providers o Does NOT accept Medicaid o Limited availability, please call early in hospitalization to schedule follow-up . [facility] Leilani Merl, PA; Maisie Fus, MD; [place], MD; [place], [place]; Jeannine Kitten, MD; [place], [place] [facility] [address], [place], [place] [address] o [phone] o Mon-Fri 8:30-5:00, Sat 9:00-12:00 o Babies seen by providers at [facility] o Accepting Medicaid o Please register online then schedule online or call office o [email or web address] . [place] [facility]  [place]) Kristian Covey, NP; Dwyane Dee, MD; Leonidas Romberg, PA o [address] [place], [place], [place] [address] o [phone] o Mon-Fri 8:00-5:00 o Babies seen by providers at [facility] o Accepting Medicaid . [place] ([facility]) Dairl Ponder, MD; Rayvon Char, NP; Melina Modena, MD o [address] [place], [place], [place] [address] o [phone] o Mon-Fri 8:00-5:30, Sat&Sun by appointment (phones open at 8:30) o Babies seen by [facility] providers o Accepting Medicaid o Must be a first-time baby or sibling of current patient . [place], [address], [place], [place]  [address] o [phone]   Fax - [phone]  [place] [phone] & [phone]) . [place], [place]; [place], [place]; Benjamine Mola, MD; [place], [place]; Harrell Lark, MD o [address] [place], [place] [address] o [phone] o Mon-Thur 8:00-7:00, Fri 8:00-5:00, Sat 8:00-12:00, Sun 9:00-12:00 o Babies seen by [facility] providers o Accepting Medicaid . [facility], MD; Ruthann Cancer, MD; [facility], MD o [address], [place], [place] [address] o [phone] o Mon-Thur 8:00-5:00 o Babies seen by providers at [facility] o Accepting Medicaid . [facility] [place], MD; Coe-Goins, MD; Amedeo Plenty, MD; Bobby Rumpf, MD; List, MD; Lavonia Drafts, MD; Ruthann Cancer, MD; Selinda Eon, MD; Audie Box, MD; Jim Like, MD; Christie Nottingham, MD; Hubbard Hartshorn, MD; Modena Nunnery, MD o [address] [place], [place]  [address] o [phone] o Mon-Fri 8:00-5:30, Sat (Oct.-Mar.) 9:00-1:00 o Babies seen by providers at [facility] o Accepting Medicaid o Must fill out new patient packet, available online at [email or web address] . [facility] ([facility] o Friddle, NP; Harris, NP; Kelly, NP; Logan, MD;  Melvin, PA; Poth, MD; Ramadoss, MD; Stanton, NP o [address], [place], [place] [address] o [phone] o Mon-Thur 8:00-5:30, Fri 8:00-5:00 o Babies seen by providers at [facility] o Accepting Medicaid  [place] ([address]) . [facility] o Dixon, PA; [place], MD; Pickard, MD; Tapia, PA o [address], [place], [place] [address] o [phone] o Mon-Fri 8:00-5:00 o Babies seen by providers at [facility] o Accepting Medicaid   [place] ([address]) . [facility] [place] o Masneri, DO; Meyers, MD; Nelson, PA o [address], [place], [place] [address] o [phone] o Mon-Fri 8:00-5:00 o Babies seen by providers at [facility] o Does NOT accept Medicaid o Limited appointment availability, please call early in hospitalization  . [facility] [place] o Kunedd, DO; McGowen, MD o [address], [place], [place] [address] o [phone] o Mon-Fri 8:00-5:00 o Babies seen by [facility] providers o Does NOT accept Medicaid . [facility] [place] o Cameron, MD; MacDonald, MD; Michaels, PA; Nayak, MD o [address] [place] [address], [place], [place] [address] o [phone] o Mon-Fri 8:00-5:00 o After hours clinic ([address] [place], [place] [address]) [phone] Mon-Fri 5:00-8:00, Sat 12:00-6:00, Sun 10:00-4:00 o Babies seen by [facility] providers o Accepting Medicaid . [facility] [place] o [address], [place], [place]  [address] o [phone]   Fax - [phone]  [name] ([address]) . [facility] [name] o Andy, MD o [address], [name], [place] [address] o [phone] o Mon-Fri 8:00-5:00 o Babies seen by [facility] providers o Does NOT accept Medicaid . [facility] [name] ([facility] [name]) o Eksir, MD o [address], [name], [place]  [address] o [phone] o Mon-Thur 8:00-7:00, Fri 8:00-5:00, Sat 8:00-12:00 o Babies seen by providers at [facility] o Accepting Medicaid - but does not have vaccinations in office (must be received elsewhere) o Limited availability, please call early in hospitalization  [place] ([address]) . [facility]  o Charlene Flemming, MD o [address], [place] [address] o [phone]  Fax [phone]   Safe Medications in Pregnancy   Acne:  Benzoyl Peroxide  Salicylic Acid   Backache/Headache:  Tylenol: 2 regular strength every 4 hours OR        2 Extra strength every 6 hours   Colds/Coughs/Allergies:  Benadryl (alcohol free) 25 mg every 6 hours as needed  Breath right strips  Claritin  Cepacol throat lozenges  Chloraseptic throat spray  Cold-Eeze- up to three times per day  Cough drops, alcohol free  Flonase (by prescription only)  Guaifenesin  Mucinex  Robitussin DM (plain only, alcohol free)  Saline nasal spray/drops  Sudafed (pseudoephedrine) & Actifed * use only after [redacted] weeks gestation and if you do not have high blood pressure  Tylenol  Vicks Vaporub  Zinc lozenges  Zyrtec   Constipation:  Colace  Ducolax suppositories  Fleet enema  Glycerin suppositories  Metamucil  Milk of magnesia  Miralax  Senokot  Smooth move tea   Diarrhea:    Kaopectate  Imodium A-D   *NO pepto Bismol   Hemorrhoids:  Anusol  Anusol HC  Preparation H  Tucks   Indigestion:  Tums  Maalox  Mylanta  Zantac  Pepcid   Insomnia:  Benadryl (alcohol free) 25mg  every 6 hours as needed  Tylenol PM  Unisom, no Gelcaps   Leg Cramps:  Tums  MagGel   Nausea/Vomiting:  Bonine  Dramamine  Emetrol  Ginger extract  Sea bands  Meclizine  Nausea medication to take during pregnancy:  Unisom (doxylamine succinate 25 mg tablets) Take one tablet daily at bedtime. If symptoms are not adequately controlled, the dose can be increased to a maximum  recommended dose of two tablets daily (1/2 tablet in the morning, 1/2 tablet mid-afternoon and one at bedtime).  Vitamin B6 100mg  tablets. Take one tablet twice a day (up to 200 mg per day).   Skin Rashes:  Aveeno products  Benadryl cream or 25mg  every 6 hours as needed  Calamine Lotion  1% cortisone cream   Yeast infection:  Gyne-lotrimin 7  Monistat 7    **If taking multiple medications, please check labels to avoid duplicating the same active ingredients  **take medication as directed on the label  ** Do not exceed 4000 mg of tylenol in 24 hours  **Do not take medications that contain aspirin or ibuprofen

## 2019-03-26 LAB — COMPREHENSIVE METABOLIC PANEL
ALT: 15 IU/L (ref 0–32)
AST: 12 IU/L (ref 0–40)
Albumin/Globulin Ratio: 1.6 (ref 1.2–2.2)
Albumin: 4.2 g/dL (ref 3.8–4.8)
Alkaline Phosphatase: 46 IU/L (ref 39–117)
BUN/Creatinine Ratio: 12 (ref 9–23)
BUN: 8 mg/dL (ref 6–20)
Bilirubin Total: 0.2 mg/dL (ref 0.0–1.2)
CO2: 21 mmol/L (ref 20–29)
Calcium: 9.5 mg/dL (ref 8.7–10.2)
Chloride: 103 mmol/L (ref 96–106)
Creatinine, Ser: 0.66 mg/dL (ref 0.57–1.00)
GFR calc Af Amer: 136 mL/min/{1.73_m2} (ref >59)
GFR calc non Af Amer: 118 mL/min/{1.73_m2} (ref >59)
Globulin, Total: 2.6 g/dL (ref 1.5–4.5)
Glucose: 74 mg/dL (ref 65–99)
Potassium: 3.9 mmol/L (ref 3.5–5.2)
Sodium: 139 mmol/L (ref 134–144)
Total Protein: 6.8 g/dL (ref 6.0–8.5)

## 2019-03-26 LAB — OBSTETRIC PANEL, INCLUDING HIV
Antibody Screen: NEGATIVE
Basophils Absolute: 0 10*3/uL (ref 0.0–0.2)
Basos: 0 %
EOS (ABSOLUTE): 0.1 10*3/uL (ref 0.0–0.4)
Eos: 1 %
HIV Screen 4th Generation wRfx: NONREACTIVE
Hematocrit: 39.4 % (ref 34.0–46.6)
Hemoglobin: 12.6 g/dL (ref 11.1–15.9)
Hepatitis B Surface Ag: NEGATIVE
Immature Grans (Abs): 0 10*3/uL (ref 0.0–0.1)
Immature Granulocytes: 0 %
Lymphocytes Absolute: 2.3 10*3/uL (ref 0.7–3.1)
Lymphs: 30 %
MCH: 27.3 pg (ref 26.6–33.0)
MCHC: 32 g/dL (ref 31.5–35.7)
MCV: 85 fL (ref 79–97)
Monocytes Absolute: 0.4 10*3/uL (ref 0.1–0.9)
Monocytes: 5 %
Neutrophils Absolute: 4.7 10*3/uL (ref 1.4–7.0)
Neutrophils: 64 %
Platelets: 277 10*3/uL (ref 150–450)
RBC: 4.62 x10E6/uL (ref 3.77–5.28)
RDW: 13.8 % (ref 11.7–15.4)
RPR Ser Ql: NONREACTIVE
Rh Factor: POSITIVE
Rubella Antibodies, IGG: 1.26 index (ref >0.99)
WBC: 7.4 10*3/uL (ref 3.4–10.8)

## 2019-03-26 LAB — HEMOGLOBIN A1C
Est. average glucose Bld gHb Est-mCnc: 111 mg/dL
Hgb A1c MFr Bld: 5.5 % (ref 4.8–5.6)

## 2019-03-27 LAB — CULTURE, OB URINE

## 2019-03-27 LAB — URINE CULTURE, OB REFLEX

## 2019-03-31 ENCOUNTER — Encounter: Payer: Self-pay | Admitting: *Deleted

## 2019-03-31 ENCOUNTER — Encounter: Payer: Self-pay | Admitting: Medical

## 2019-03-31 DIAGNOSIS — R87610 Atypical squamous cells of undetermined significance on cytologic smear of cervix (ASC-US): Secondary | ICD-10-CM | POA: Insufficient documentation

## 2019-03-31 LAB — CYTOLOGY - PAP
Chlamydia: NEGATIVE
Comment: NEGATIVE
Comment: NEGATIVE
Comment: NORMAL
Diagnosis: UNDETERMINED — AB
High risk HPV: NEGATIVE
Neisseria Gonorrhea: NEGATIVE

## 2019-04-17 ENCOUNTER — Encounter: Payer: Self-pay | Admitting: *Deleted

## 2019-04-19 LAB — SPECIMEN STATUS REPORT

## 2019-04-19 LAB — VITAMIN D 25 HYDROXY (VIT D DEFICIENCY, FRACTURES): Vit D, 25-Hydroxy: 24.5 ng/mL — ABNORMAL LOW (ref 30.0–100.0)

## 2019-04-21 ENCOUNTER — Encounter: Payer: Self-pay | Admitting: Medical

## 2019-04-21 DIAGNOSIS — E559 Vitamin D deficiency, unspecified: Secondary | ICD-10-CM | POA: Insufficient documentation

## 2019-04-22 ENCOUNTER — Telehealth (INDEPENDENT_AMBULATORY_CARE_PROVIDER_SITE_OTHER): Payer: Self-pay | Admitting: Obstetrics and Gynecology

## 2019-04-22 VITALS — BP 107/55 | HR 71

## 2019-04-22 DIAGNOSIS — Z758 Other problems related to medical facilities and other health care: Secondary | ICD-10-CM

## 2019-04-22 DIAGNOSIS — O99211 Obesity complicating pregnancy, first trimester: Secondary | ICD-10-CM

## 2019-04-22 DIAGNOSIS — Z3492 Encounter for supervision of normal pregnancy, unspecified, second trimester: Secondary | ICD-10-CM

## 2019-04-22 DIAGNOSIS — Z3A16 16 weeks gestation of pregnancy: Secondary | ICD-10-CM

## 2019-04-22 DIAGNOSIS — Z789 Other specified health status: Secondary | ICD-10-CM

## 2019-04-22 DIAGNOSIS — O09292 Supervision of pregnancy with other poor reproductive or obstetric history, second trimester: Secondary | ICD-10-CM

## 2019-04-22 NOTE — Progress Notes (Signed)
   MY CHART VIDEO VIRTUAL OBSTETRICS VISIT ENCOUNTER NOTE  I connected with Laura Brady on 04/22/19 at  2:15 PM EDT by My Chart video at home and verified that I am speaking with the correct person using two identifiers.   I discussed the limitations, risks, security and privacy concerns of performing an evaluation and management service by My Chart video and the availability of in person appointments. I also discussed with the patient that there may be a patient responsible charge related to this service. The patient expressed understanding and agreed to proceed.  Subjective:  Laura Brady is a 34 y.o. [phone] at [redacted]w[redacted]d being followed for ongoing prenatal care.  She is currently monitored for the following issues for this low-risk pregnancy and has Family history of diabetes mellitus (DM); History of COVID-19; Supervision of low-risk pregnancy; Obesity affecting pregnancy in first trimester; History of gestational diabetes in prior pregnancy, currently pregnant in first trimester; Atypical squamous cells of undetermined significance (ASCUS) on Papanicolaou smear of cervix; and Vitamin D deficiency on their problem list.  Patient reports no complaints and She states N/V has resolved and she is eating well. Reports fetal movement. Denies any contractions, bleeding or leaking of fluid.   The following portions of the patient's history were reviewed and updated as appropriate: allergies, current medications, past family history, past medical history, past social history, past surgical history and problem list.   Objective:   General:  Alert, oriented and cooperative.   Mental Status: Normal mood and affect perceived. Normal judgment and thought content.  Rest of physical exam deferred due to type of encounter  BP (!) 107/55   Pulse 71   LMP 12/27/2018  **Done by patient's own at home BP cuff   Assessment and Plan:  Pregnancy: [id number] at [redacted]w[redacted]d  Encounter for supervision  of low-risk pregnancy in second trimester - Scheduled for anatomy U/S at [facility] on 05/05/2019   Language barrier affecting health care - Karl Luke - Spanish Interpreter present throughout entire visit.   Preterm labor symptoms and general obstetric precautions including but not limited to vaginal bleeding, contractions, leaking of fluid and fetal movement were reviewed in detail with the patient.  I discussed the assessment and treatment plan with the patient. The patient was provided an opportunity to ask questions and all were answered. The patient agreed with the plan and demonstrated an understanding of the instructions. The patient was advised to call back or seek an in-person office evaluation/go to [facility] at [facility] for any urgent or concerning symptoms. Please refer to After Visit Summary for other counseling recommendations.   I provided 5 minutes of non-face-to-face time during this encounter. There was 5 minutes of chart review time spent prior to this encounter. Total time spent = 10 minutes.  Return in about 4 weeks (around 05/20/2019) for Return OB - My Chart video.  Future Appointments  Date Time Provider Department Center  05/20/2019  3:55 PM Currie Paris, NP [facility]  11/19/2019  8:00 AM Romualdo Bolk, MD [facility] None    Raelyn Mora, CNM Center for [employer], [facility]

## 2019-05-08 ENCOUNTER — Encounter: Payer: Self-pay | Admitting: General Practice

## 2019-05-20 ENCOUNTER — Other Ambulatory Visit: Payer: Self-pay

## 2019-05-20 ENCOUNTER — Encounter: Payer: Self-pay | Admitting: Nurse Practitioner

## 2019-05-20 ENCOUNTER — Ambulatory Visit (INDEPENDENT_AMBULATORY_CARE_PROVIDER_SITE_OTHER): Payer: Self-pay | Admitting: Nurse Practitioner

## 2019-05-20 VITALS — BP 113/57 | HR 64 | Wt 188.4 lb

## 2019-05-20 DIAGNOSIS — Z789 Other specified health status: Secondary | ICD-10-CM

## 2019-05-20 DIAGNOSIS — Z3A20 20 weeks gestation of pregnancy: Secondary | ICD-10-CM

## 2019-05-20 DIAGNOSIS — O09291 Supervision of pregnancy with other poor reproductive or obstetric history, first trimester: Secondary | ICD-10-CM

## 2019-05-20 DIAGNOSIS — Z8632 Personal history of gestational diabetes: Secondary | ICD-10-CM

## 2019-05-20 DIAGNOSIS — O99212 Obesity complicating pregnancy, second trimester: Secondary | ICD-10-CM

## 2019-05-20 DIAGNOSIS — Z3491 Encounter for supervision of normal pregnancy, unspecified, first trimester: Secondary | ICD-10-CM

## 2019-05-20 DIAGNOSIS — E669 Obesity, unspecified: Secondary | ICD-10-CM

## 2019-05-20 DIAGNOSIS — O09292 Supervision of pregnancy with other poor reproductive or obstetric history, second trimester: Secondary | ICD-10-CM

## 2019-05-20 NOTE — Patient Instructions (Signed)
Call Financial Office at [facility] to inquire about the cost of the tubal ligation.

## 2019-05-20 NOTE — Progress Notes (Signed)
    Subjective:  Laura Brady is a 34 y.o. [phone] at [redacted]w[redacted]d being seen today for ongoing prenatal care.  She is currently monitored for the following issues for this low-risk pregnancy and has Family history of diabetes mellitus (DM); History of COVID-19; Supervision of low-risk pregnancy; Obesity affecting pregnancy in first trimester; History of gestational diabetes in prior pregnancy, currently pregnant in first trimester; Atypical squamous cells of undetermined significance (ASCUS) on Papanicolaou smear of cervix; and Vitamin D deficiency on their problem list.  Patient reports no complaints.  Contractions: Not present. Vag. Bleeding: None.  Movement: Present. Denies leaking of fluid.   The following portions of the patient's history were reviewed and updated as appropriate: allergies, current medications, past family history, past medical history, past social history, past surgical history and problem list. Problem list updated.  Objective:   Vitals:   05/20/19 1625  BP: (!) 113/57  Pulse: 64  Weight: 188 lb 6.4 oz (85.5 kg)    Fetal Status: Fetal Heart Rate (bpm): 151 Fundal Height: 24 cm Movement: Present     General:  Alert, oriented and cooperative. Patient is in no acute distress.  Skin: Skin is warm and dry. No rash noted.   Cardiovascular: Normal heart rate noted  Respiratory: Normal respiratory effort, no problems with respiration noted  Abdomen: Soft, gravid, appropriate for gestational age. Pain/Pressure: Absent     Pelvic:  Cervical exam deferred        Extremities: Normal range of motion.  Edema: None  Mental Status: Normal mood and affect. Normal behavior. Normal judgment and thought content.   Urinalysis:      Assessment and Plan:  Pregnancy: [id number] at [redacted]w[redacted]d  1. Encounter for supervision of low-risk pregnancy in first trimester Doing well Reviewed [place] results from [facility] - normal anatomy, female Feeling the baby move Watch fundal height  2.  History of gestational diabetes in prior pregnancy, currently pregnant in first trimester Will do 2 hr glucola at 28 weeks  3.  Language Barrier In person interpreter present for the entire visit  Preterm labor symptoms and general obstetric precautions including but not limited to vaginal bleeding, contractions, leaking of fluid and fetal movement were reviewed in detail with the patient. Please refer to After Visit Summary for other counseling recommendations.  Return in about 4 weeks (around 06/17/2019) for ROB in person.  Nolene Bernheim, RN, MSN, NP-BC Nurse Practitioner, [facility], [facility], [facility] for [employer], [facility] 05/20/2019 8:04 PM

## 2019-06-17 ENCOUNTER — Other Ambulatory Visit: Payer: Self-pay

## 2019-06-17 ENCOUNTER — Ambulatory Visit (INDEPENDENT_AMBULATORY_CARE_PROVIDER_SITE_OTHER): Payer: Self-pay | Admitting: Medical

## 2019-06-17 ENCOUNTER — Encounter: Payer: Self-pay | Admitting: Medical

## 2019-06-17 VITALS — BP 109/57 | HR 69 | Wt 197.2 lb

## 2019-06-17 DIAGNOSIS — O99212 Obesity complicating pregnancy, second trimester: Secondary | ICD-10-CM

## 2019-06-17 DIAGNOSIS — O09292 Supervision of pregnancy with other poor reproductive or obstetric history, second trimester: Secondary | ICD-10-CM

## 2019-06-17 DIAGNOSIS — O99211 Obesity complicating pregnancy, first trimester: Secondary | ICD-10-CM

## 2019-06-17 DIAGNOSIS — O09291 Supervision of pregnancy with other poor reproductive or obstetric history, first trimester: Secondary | ICD-10-CM

## 2019-06-17 DIAGNOSIS — Z3A24 24 weeks gestation of pregnancy: Secondary | ICD-10-CM

## 2019-06-17 DIAGNOSIS — R87610 Atypical squamous cells of undetermined significance on cytologic smear of cervix (ASC-US): Secondary | ICD-10-CM

## 2019-06-17 DIAGNOSIS — Z3492 Encounter for supervision of normal pregnancy, unspecified, second trimester: Secondary | ICD-10-CM

## 2019-06-17 DIAGNOSIS — Z8632 Personal history of gestational diabetes: Secondary | ICD-10-CM

## 2019-06-17 DIAGNOSIS — E669 Obesity, unspecified: Secondary | ICD-10-CM

## 2019-06-17 NOTE — Progress Notes (Signed)
   PRENATAL VISIT NOTE  Subjective:  Laura Brady is a 34 y.o. [phone] at [redacted]w[redacted]d being seen today for ongoing prenatal care.  She is currently monitored for the following issues for this high-risk pregnancy and has Family history of diabetes mellitus (DM); History of COVID-19; Supervision of low-risk pregnancy; Obesity affecting pregnancy in first trimester; History of gestational diabetes in prior pregnancy, currently pregnant in first trimester; Atypical squamous cells of undetermined significance (ASCUS) on Papanicolaou smear of cervix; and Vitamin D deficiency on their problem list.  Patient reports no complaints.  Contractions: Not present. Vag. Bleeding: None.  Movement: Present. Denies leaking of fluid.   The following portions of the patient's history were reviewed and updated as appropriate: allergies, current medications, past family history, past medical history, past social history, past surgical history and problem list.   Objective:   Vitals:   06/17/19 0840  BP: (!) 109/57  Pulse: 69  Weight: 197 lb 3.2 oz (89.4 kg)    Fetal Status: Fetal Heart Rate (bpm): 151 Fundal Height: 26 cm Movement: Present     General:  Alert, oriented and cooperative. Patient is in no acute distress.  Skin: Skin is warm and dry. No rash noted.   Cardiovascular: Normal heart rate noted  Respiratory: Normal respiratory effort, no problems with respiration noted  Abdomen: Soft, gravid, appropriate for gestational age.  Pain/Pressure: Absent     Pelvic: Cervical exam deferred        Extremities: Normal range of motion.  Edema: None  Mental Status: Normal mood and affect. Normal behavior. Normal judgment and thought content.   Assessment and Plan:  Pregnancy: [id number] at [redacted]w[redacted]d 1. Encounter for supervision of low-risk pregnancy in second trimester - Doing well - Anticipatory guidance for future visit given - Plan for fasting 2 hour GTT next visit with CBC, HIV and RPR - Planning to use  [facility] for Peds - Normal anatomy US reviewed from [facility]  2. History of gestational diabetes in prior pregnancy, currently pregnant in first trimester - GTT at next visit - Normal Hgb A1c  3. Obesity affecting pregnancy in first trimester - Taking BASA   4. Atypical squamous cells of undetermined significance (ASCUS) on Papanicolaou smear of cervix - Routine follow-up   Preterm labor symptoms and general obstetric precautions including but not limited to vaginal bleeding, contractions, leaking of fluid and fetal movement were reviewed in detail with the patient. Please refer to After Visit Summary for other counseling recommendations.   Return in about 4 weeks (around 07/15/2019) for LOB, In-Person, 28 week labs (fasting).  Future Appointments  Date Time Provider Department Center  11/19/2019  8:00 AM Romualdo Bolk, MD [facility] None    Vonzella Nipple, PA-C

## 2019-06-17 NOTE — Patient Instructions (Signed)

## 2019-07-17 ENCOUNTER — Other Ambulatory Visit: Payer: Self-pay

## 2019-07-17 ENCOUNTER — Ambulatory Visit (INDEPENDENT_AMBULATORY_CARE_PROVIDER_SITE_OTHER): Payer: Medicaid Other | Admitting: Obstetrics and Gynecology

## 2019-07-17 VITALS — BP 112/58 | HR 72 | Wt 205.0 lb

## 2019-07-17 DIAGNOSIS — Z8616 Personal history of COVID-19: Secondary | ICD-10-CM

## 2019-07-17 DIAGNOSIS — Z23 Encounter for immunization: Secondary | ICD-10-CM

## 2019-07-17 DIAGNOSIS — Z3493 Encounter for supervision of normal pregnancy, unspecified, third trimester: Secondary | ICD-10-CM

## 2019-07-17 DIAGNOSIS — Z8632 Personal history of gestational diabetes: Secondary | ICD-10-CM

## 2019-07-17 DIAGNOSIS — Z3A28 28 weeks gestation of pregnancy: Secondary | ICD-10-CM

## 2019-07-17 NOTE — Progress Notes (Signed)
   PRENATAL VISIT NOTE  Subjective:  Laura Brady is a 34 y.o. [phone] at [redacted]w[redacted]d being seen today for ongoing prenatal care.  She is currently monitored for the following issues for this low-risk pregnancy and has Family history of diabetes mellitus (DM); History of COVID-19; Supervision of low-risk pregnancy; Obesity affecting pregnancy in first trimester; History of gestational diabetes in prior pregnancy, currently pregnant in first trimester; Atypical squamous cells of undetermined significance (ASCUS) on Papanicolaou smear of cervix; and Vitamin D deficiency on their problem list.  Patient reports no complaints.  Contractions: Irritability. Vag. Bleeding: None.  Movement: Present. Denies leaking of fluid. Some braxton hicks contractions that stopped. States they were painful. She has no pain now and no braxton hicks.   The following portions of the patient's history were reviewed and updated as appropriate: allergies, current medications, past family history, past medical history, past social history, past surgical history and problem list.   Objective:   Vitals:   07/17/19 0845  BP: (!) 112/58  Pulse: 72  Weight: 205 lb (93 kg)    Fetal Status: Fetal Heart Rate (bpm): 159 Fundal Height: 29 cm Movement: Present     General:  Alert, oriented and cooperative. Patient is in no acute distress.  Skin: Skin is warm and dry. No rash noted.   Cardiovascular: Normal heart rate noted  Respiratory: Normal respiratory effort, no problems with respiration noted  Abdomen: Soft, gravid, appropriate for gestational age.  Pain/Pressure: Present     Pelvic: Cervical exam deferred        Extremities: Normal range of motion.  Edema: None  Mental Status: Normal mood and affect. Normal behavior. Normal judgment and thought content.   Assessment and Plan:  Pregnancy: [id number] at [redacted]w[redacted]d 1. Encounter for supervision of low-risk pregnancy in third trimester  - Tdap vaccine greater than or equal  to 7yo IM - HIV Antibody (routine testing w rflx) - RPR - CBC - Glucose Tolerance, 2 Hours w/1 Hour - Continue BASA  - If contractions return, drink plenty of water and rest. If continues, you should go to MAU for evaluation.     Preterm labor symptoms and general obstetric precautions including but not limited to vaginal bleeding, contractions, leaking of fluid and fetal movement were reviewed in detail with the patient. Please refer to After Visit Summary for other counseling recommendations.   No follow-ups on file.  Future Appointments  Date Time Provider Department Center  07/30/2019  2:35 PM Marylen Ponto, NP [facility]  11/19/2019  8:00 AM Romualdo Bolk, MD [facility] None    Venia Carbon, NP

## 2019-07-17 NOTE — Patient Instructions (Signed)
https://www.cdc.gov/vaccines/hcp/vis/vis-statements/tdap.pdf">  °Vacuna Tdap (tétanos, difteria y tos ferina): lo que debe saber °Tdap (Tetanus, Diphtheria, Pertussis) Vaccine: What You Need to Know °1. ¿Por qué vacunarse? °La vacuna Tdap puede prevenir el tétanos, la difteria y la tos ferina. °La difteria y la tos ferina se contagian de persona a persona. El tétanos ingresa al organismo a través de cortes o heridas. °· El TÉTANOS (T) provoca rigidez dolorosa en los músculos. El tétanos puede causar graves problemas de salud, como no poder abrir la boca, tener dificultad para tragar y respirar, o la muerte. °· La DIFTERIA (D) puede causar dificultad para respirar, insuficiencia cardíaca, parálisis o muerte. °· La TOS FERINA (aP) también conocida como “tos convulsa” puede causar tos violenta e incontrolable lo que hace difícil respirar, comer o beber. La tos ferina puede ser muy grave en los bebés y en los niños pequeños, y causar neumonía, convulsiones, daño cerebral o la muerte. En adolescentes y adultos, puede causar pérdida de peso, pérdida del control de la vejiga, desmayos y fracturas de costillas al toser de manera intensa. °2. Vacuna Tdap °La vacuna Tdap es solo para niños de 7 años en adelante, adolescentes y adultos.  °Los adolescentes debe recibir una dosis única de la vacuna Tdap, preferentemente a los 11 o 12 años. °Las mujeres embarazadas deben recibir una dosis de la vacuna Tdap en cada embarazo para proteger al recién nacido de la tos ferina. Los bebés tienen mayor riesgo de sufrir complicaciones graves y potencialmente mortales debido a la tos ferina. °Los adultos que nunca recibieron la vacuna Tdap deben recibir una dosis. °Además, los adultos deben recibir una dosis de refuerzo cada 10 años, o antes si la persona sufre una quemadura o una herida grave y sucia. Las dosis de refuerzo pueden ser de la vacuna Tdap o la Td (una vacuna diferente que protege contra el tétanos y la difteria, pero no contra  la tos ferina). °La vacuna Tdap puede ser administrada al mismo tiempo que otras vacunas. °3. Hable con el médico °Comuníquese con la persona que le coloca las vacunas si la persona que la recibe: °· Ha tenido una reacción alérgica después de una dosis anterior de cualquier vacuna contra el tétanos, la difteria o la tos ferina, o cualquier alergia grave, potencialmente mortal. °· Ha tenido un coma, disminución del nivel de la conciencia o convulsiones prolongadas dentro de los 7 días posteriores a una dosis anterior de cualquier vacuna contra la tos ferina (DTP, DTaP o Tdap). °· Tiene convulsiones u otro problema del sistema nervioso. °· Alguna vez tuvo síndrome de Guillain-Barré (también llamado SGB). °· Ha tenido dolor intenso o hinchazón después de una dosis anterior de cualquier vacuna contra el tétanos o la difteria. °En algunos casos, es posible que el médico decida posponer la aplicación de la vacuna Tdap para una visita en el futuro.  °Las personas que sufren trastornos menores, como un resfrío, pueden vacunarse. Las personas que tienen enfermedades moderadas o graves generalmente deben esperar hasta recuperarse para poder recibir la vacuna Tdap.  °Su médico puede darle más información. °4. Riesgos de una reacción a la vacuna °· Después de recibir la vacuna Tdap a veces se puede tener dolor, enrojecimiento o hinchazón en el lugar donde se aplicó la inyección, fiebre leve, dolor de cabeza, sensación de cansancio y náuseas, vómitos, diarrea o dolor de estómago. °Las personas a veces se desmayan después de procedimientos médicos, incluida la vacunación. Informe al médico si se siente mareado, tiene cambios en la visión o zumbidos   en los oídos.  °Al igual que con cualquier medicamento, existe una probabilidad muy remota de que una vacuna cause una reacción alérgica grave, otra lesión grave o la muerte. °5. ¿Qué pasa si se presenta un problema grave? °Podría producirse una reacción alérgica después de que la  persona vacunada abandone la clínica. Si observa signos de una reacción alérgica grave (ronchas, hinchazón de la cara y la garganta, dificultad para respirar, latidos cardíacos acelerados, mareos o debilidad), llame al 9-1-1 y lleve a la persona al hospital más cercano. °Si se presentan otros signos que le preocupan, comuníquese con su médico.  °Las reacciones adversas deben informarse al Sistema de Informe de Eventos Adversos de Vacunas (Vaccine Adverse Event Reporting System, VAERS). Por lo general, el médico presenta este informe o puede hacerlo usted mismo. Visite el sitio web del VAERS en www.vaers.hhs.gov o llame al 1-800-822-7967. El VAERS es solo para informar reacciones; su personal no proporciona asesoramiento médico. °6. Programa Nacional de Compensación de Daños por Vacunas °El Programa Nacional de Compensación de Daños por Vacunas (National Vaccine Injury Compensation Program, VICP) es un programa federal que fue creado para compensar a las personas que puedan haber sufrido daños al recibir ciertas vacunas. Visite el sitio web del VICP en www.hrsa.gov/vaccinecompensation o llame al 1-800-338-2382 para obtener más información acerca del programa y de cómo presentar un reclamo. Hay un límite de tiempo para presentar un reclamo de compensación. °7. ¿Cómo puedo obtener más información? °· Pregúntele a su médico. °· Comuníquese con el servicio de salud de su localidad o su estado. °· Comuníquese con los Centers for Disease Control and Prevention, CDC (Centros para el Control y la Prevención de Enfermedades): °? Llame al 1-800-232-4636 (1-800-CDC-INFO) o °? Visite el sitio web de los CDC en www.cdc.gov/vaccines °Declaración de información de la vacuna Tdap (tétanos, difteria y tos ferina) (01/12/2018) °Esta información no tiene como fin reemplazar el consejo del médico. Asegúrese de hacerle al médico cualquier pregunta que tenga. °Document Revised: 05/07/2018 Document Reviewed: 05/07/2018 °Elsevier Patient  Education © 2020 Elsevier Inc. ° ° °

## 2019-07-18 LAB — GLUCOSE TOLERANCE, 2 HOURS W/ 1HR
Glucose, 1 hour: 171 mg/dL (ref 65–179)
Glucose, 2 hour: 127 mg/dL (ref 65–152)
Glucose, Fasting: 83 mg/dL (ref 65–91)

## 2019-07-18 LAB — CBC
Hematocrit: 33.1 % — ABNORMAL LOW (ref 34.0–46.6)
Hemoglobin: 10.9 g/dL — ABNORMAL LOW (ref 11.1–15.9)
MCH: 28.5 pg (ref 26.6–33.0)
MCHC: 32.9 g/dL (ref 31.5–35.7)
MCV: 87 fL (ref 79–97)
Platelets: 221 10*3/uL (ref 150–450)
RBC: 3.82 x10E6/uL (ref 3.77–5.28)
RDW: 13.7 % (ref 11.7–15.4)
WBC: 8.1 10*3/uL (ref 3.4–10.8)

## 2019-07-18 LAB — HIV ANTIBODY (ROUTINE TESTING W REFLEX): HIV Screen 4th Generation wRfx: NONREACTIVE

## 2019-07-18 LAB — RPR: RPR Ser Ql: NONREACTIVE

## 2019-07-30 ENCOUNTER — Other Ambulatory Visit: Payer: Self-pay

## 2019-07-30 ENCOUNTER — Ambulatory Visit (INDEPENDENT_AMBULATORY_CARE_PROVIDER_SITE_OTHER): Payer: Self-pay | Admitting: Women's Health

## 2019-07-30 VITALS — BP 117/48 | HR 84 | Wt 205.9 lb

## 2019-07-30 DIAGNOSIS — Z3A30 30 weeks gestation of pregnancy: Secondary | ICD-10-CM

## 2019-07-30 DIAGNOSIS — Z8632 Personal history of gestational diabetes: Secondary | ICD-10-CM

## 2019-07-30 DIAGNOSIS — O3443 Maternal care for other abnormalities of cervix, third trimester: Secondary | ICD-10-CM

## 2019-07-30 DIAGNOSIS — Z603 Acculturation difficulty: Secondary | ICD-10-CM

## 2019-07-30 DIAGNOSIS — O0993 Supervision of high risk pregnancy, unspecified, third trimester: Secondary | ICD-10-CM

## 2019-07-30 DIAGNOSIS — Z8616 Personal history of COVID-19: Secondary | ICD-10-CM

## 2019-07-30 DIAGNOSIS — R87610 Atypical squamous cells of undetermined significance on cytologic smear of cervix (ASC-US): Secondary | ICD-10-CM

## 2019-07-30 DIAGNOSIS — Z3493 Encounter for supervision of normal pregnancy, unspecified, third trimester: Secondary | ICD-10-CM

## 2019-07-30 NOTE — Patient Instructions (Addendum)
Maternity Assessment Unit (MAU)  The Maternity Assessment Unit (MAU) is located at the [facility] at [facility]. The address is: [address], [place], [place], [place] [address]. Please see map below for additional directions.    The Maternity Assessment Unit is designed to help you during your pregnancy, and for up to 6 weeks after delivery, with any pregnancy- or postpartum-related emergencies, if you think you are in labor, or if your water has broken. For example, if you experience nausea and vomiting, vaginal bleeding, severe abdominal or pelvic pain, elevated blood pressure or other problems related to your pregnancy or postpartum time, please come to the Maternity Assessment Unit for assistance.       Informacin sobre parto y [place] de parto prematuros (Preterm Labor and Birth Information) La duracin de un embarazo normal es de 39 a 41semanas. Se llama trabajo de parto prematuro cuando se inicia antes de las 37semanas de [place]. CULES SON LOS FACTORES DE RIESGO DEL TRABAJO DE PARTO PREMATURO? Existen mayores probabilidades de trabajo de parto prematuro en mujeres con las siguientes caractersticas:  Tienen ciertas infecciones durante el embarazo, como infeccin de vejiga, infeccin de transmisin sexual o infeccin en el tero (corioamnionitis).  Tienen el cuello del tero ms corto que lo normal.  Tuvieron trabajo de parto prematuro anteriormente.  Se sometieron a una ciruga en el cuello del tero.  Son menores de 17aos o [address] de 35aos de edad.  Son afroamericanas.  Estn embarazadas de [employer] o de varios bebs (gestacin mltiple).  Consumen drogas o fuman mientras estn embarazadas.  No aumentan de peso lo suficiente durante el [employer].  Se embarazan poco despus de [employer]. CULES SON LOS SNTOMAS DEL TRABAJO DE PARTO PREMATURO? Los sntomas del trabajo de parto prematuro incluyen lo  siguiente:  Educational psychologist similares a los que ocurren durante el perodo menstrual. Los calambres pueden presentarse con diarrea.  Dolor en el abdomen o en la parte inferior de la espalda.  Contracciones uterinas regulares que se pueden sentir como una presin en el abdomen.  Una sensacin de mayor presin en la pelvis.  Aumento de la secrecin de moco acuoso o sanguinolento en la vagina.  Rotura de bolsa (rotura de saco amnitico). POR QU ES IMPORTANTE RECONOCER LOS SIGNOS DEL TRABAJO DE PARTO PREMATURO? Es Public librarian los signos del trabajo de parto prematuro porque los bebs que nacen de forma prematura pueden no estar completamente desarrollados. Por lo tanto, pueden correr mayor riesgo de lo siguiente:  Problemas cardacos y pulmonares a Air cabin crew (crnicos).  Inmediatamente despus del parto, dificultades para regular los sistemas corporales, que incluyen glucemia, temperatura corporal, frecuencia cardaca y frecuencia respiratoria.  Hemorragia cerebral.  Parlisis cerebral.  Dificultades en el aprendizaje.  Muerte. Estos riesgos son [employer] para bebs que nacen antes de las 34semanas de [place]. CMO SE TRATA EL TRABAJO DE PARTO PREMATURO? El tratamiento depende del tiempo de su [place], su afeccin y la salud de su beb. Puede incluir lo siguiente:  Tener un punto (sutura) en el cuello del tero para evitar que este se abra demasiado pronto (cerclaje).  Tomar medicamentos, por ejemplo: ? Medicamentos hormonales. Estos se pueden administrar de forma temprana en el embarazo para ayudar a Visual merchandiser. ? Medicamentos para TEFL teacher las contracciones. ? Medicamentos que ayudan a [employer] del beb. Estos se pueden recetar si el riesgo de parto es [place]. ? Medicamentos para evitar que el beb desarrolle parlisis cerebral. Si el [place]  de parto de inicia antes de las 34semanas de [place], es posible que deba hospitalizarse. QU DEBO  HACER SI CREO QUE ESTOY EN TRABAJO DE PARTO PREMATURO? Si cree que est iniciando trabajo de parto prematuro, llame al mdico de inmediato. CMO PUEDO EVITAR EL TRABAJO DE PARTO PREMATURO EN FUTUROS EMBARAZOS? Para aumentar las probabilidades de tener un embarazo a trmino, Financial planner en cuenta lo siguiente:  No consuma ningn producto que contenga tabaco, lo que incluye cigarrillos, tabaco de Theatre manager y Administrator, Civil Service. Si necesita ayuda para dejar de fumar, consulte al mdico.  No consuma drogas ni medicamentos que no sean recetados Academic librarian.  Hable con el mdico antes de tomar suplementos a base de hierbas aunque los [employer].  Asegrese de llegar a un peso Office manager.  Tenga cuidado con las infecciones. Si cree que puede tener una infeccin, consulte al mdico para que la revisen.  Asegrese de informarle al mdico si ha tenido trabajo de parto prematuro antes. Esta informacin no tiene Theme park manager el consejo del mdico. Asegrese de hacerle al mdico cualquier pregunta que tenga. Document Revised: 09/02/2015 Document Reviewed: 05/19/2015 Elsevier Patient Education  2020 Elsevier Inc.       Las medicinas seguras para tomar durante el embarazo  Safe Medications in Pregnancy  Acn:  Benzoyl Peroxide (Perxido de benzolo)  Salicylic Acid (cido saliclico)  Dolor de espalda/Dolor de cabeza:  Tylenol: 2 pastillas de concentracin regular cada 4 horas O 2 pastillas de concentracin fuerte cada 6 horas  Resfriados/Tos/Alergias:  Benadryl (sin alcohol) 25 mg cada 6 horas segn lo necesite Breath Right strips (Tiras para respirar correctamente)  Claritin  Cepacol (pastillas de chupar para la garganta)  Chloraseptic (aerosol para la garganta)  Cold-Eeze- hasta tres veces por da  Cough drops (pastillas de chupar para la tos, sin alcohol)  Flonase (con receta mdica solamente)  Guaifenesin  Mucinex  Robitussin  DM (simple solamente, sin alcohol)  Saline nasal spray/drops (Aerosol nasal salino/gotas) Sudafed (pseudoephedrine) y  Actifed * utilizar slo despus de 12 semanas de gestacin y si no tiene la presin arterial alta.  Tylenol Vicks  VapoRub  Zinc lozenges (pastillas para la garganta)  Zyrtec  Estreimiento:  Colace  Ducolax (supositorios)  Fleet enema (lavado intestinal rectal)  Glycerin (supositorios)  Metamucil  Milk of magnesia (leche de magnesia)  Miralax  Senokot  Smooth Move (t)  Diarrea:  Kaopectate Imodium A-D  *NO tome Pepto-Bismol  Hemorroides:  Anusol  Anusol HC  Preparation H  Tucks  Indigestin:  Tums  Maalox  Mylanta  Zantac  Pepcid  Insomnia:  Benadryl (sin alcohol) 25mg  cada 6 horas segn lo necesite  Tylenol PM  Unisom, no Gelcaps  Calambres en las piernas:  Tums  MagGel Nuseas/Vmitos:  Bonine  Dramamine  Emetrol  Ginger (extracto)  Sea-Bands  Meclizine  Medicina para las nuseas que puede tomar durante el embarazo: Unisom (doxylamine succinate, pastillas de 25 mg) Tome una pastilla al da al [place]. Si los sntomas no estn adecuadamente controlados, la dosis puede aumentarse hasta una dosis mxima recomendada de [place] al da (1/2 pastilla por la [place], 1/2 pastilla a media tarde y [place] pastilla al [place]). Pastillas de Vitamina B6 de 100mg . Tome [place] veces al da (hasta 200 mg por da).  Erupciones en la piel:  Productos de Aveeno  Benadryl cream (crema o una dosis de 25mg  cada 6 horas segn lo necesite)  Calamine Lotion (locin)  1% cortisone cream (  crema de cortisona de 1%)  nfeccin vaginal por hongos (candidiasis):  Gyne-lotrimin 7  Monistat 7   **Si est tomando varias medicinas, por favor revise las etiquetas para evitar [employer] mismos ingredientes [place]. **Tome la medicina segn lo indicado en la etiqueta. **No tome ms de 400 mg de Tylenol en 24 horas. **No tome medicinas que contengan aspirina o  ibuprofeno.

## 2019-07-30 NOTE — Progress Notes (Signed)
Subjective:  Laura Brady is a 34 y.o. [phone] at [redacted]w[redacted]d being seen today for ongoing prenatal care.  She is currently monitored for the following issues for this low-risk pregnancy and has Family history of diabetes mellitus (DM); History of COVID-19; Supervision of low-risk pregnancy; Obesity affecting pregnancy in first trimester; History of gestational diabetes in prior pregnancy, currently pregnant in first trimester; Atypical squamous cells of undetermined significance (ASCUS) on Papanicolaou smear of cervix; and Vitamin D deficiency on their problem list.  Patient reports no complaints.  Contractions: Irritability. Vag. Bleeding: None.  Movement: Present. Denies leaking of fluid.   The following portions of the patient's history were reviewed and updated as appropriate: allergies, current medications, past family history, past medical history, past social history, past surgical history and problem list. Problem list updated.  Objective:   Vitals:   07/30/19 1456  BP: (!) 117/48  Pulse: 84  Weight: 205 lb 14.4 oz (93.4 kg)    Fetal Status: Fetal Heart Rate (bpm): 159   Movement: Present     General:  Alert, oriented and cooperative. Patient is in no acute distress.  Skin: Skin is warm and dry. No rash noted.   Cardiovascular: Normal heart rate noted  Respiratory: Normal respiratory effort, no problems with respiration noted  Abdomen: Soft, gravid, appropriate for gestational age. Pain/Pressure: Present     Pelvic: Vag. Bleeding: None     Cervical exam deferred        Extremities: Normal range of motion.  Edema: None  Mental Status: Normal mood and affect. Normal behavior. Normal judgment and thought content.   Urinalysis:      Assessment and Plan:  Pregnancy: [id number] at [redacted]w[redacted]d  1. Encounter for supervision of low-risk pregnancy in third trimester - pt to bring BP cuff to clinic to check for fit; pt reports occasional HA, relieved by Tylenol, but also reports a home  BP of 140/90 at time of HA that resolved after HA resolved; pt to bring home BP cuff to next visit to check for fit -Reviewed warning blood pressure values (systolic = / > 140 and/or diastolic =/> 90). Explained that, if blood pressure is elevated, she should sit down, rest, and eat/drink something. If still elevated 15 minutes later, she should call clinic or come to MAU. She should come to MAU if she has elevated pressures and any of the following:  - headache not relieved with tylenol, rest, hydration -blurry vision, floating spots in her vision - sudden full-body edema or facial edema -RUQ pain that is constant.  These symptoms may indicate that her blood pressure is worsening and she may be developing gestational hypertension or pre-eclampsia, which is an emergency.  - discussed COVID vaccine in pregnancy, pt will get Moderna or [employer] - will schedule f/u US at [facility] d/t left pelvic fullness and incomplete evaluation of fetal profile  2. History of gestational diabetes in prior pregnancy, currently pregnant in first trimester - no GDM this pregnancy Glucose, Fasting 65 - 91 mg/dL 83   Glucose, 1 hour 65 - 179 mg/dL 191   Glucose, 2 hour 65 - 152 mg/dL 478     3. Atypical squamous cells of undetermined significance (ASCUS) on Papanicolaou smear of cervix -found at [facility], co-test in 3 years  Preterm labor symptoms and general obstetric precautions including but not limited to vaginal bleeding, contractions, leaking of fluid and fetal movement were reviewed in detail with the patient. Please refer to After Visit Summary for other counseling recommendations.  I discussed the assessment and treatment plan with the patient. The patient was provided an opportunity to ask questions and all were answered. The patient agreed with the plan and demonstrated an understanding of the instructions. The patient was advised to call back or seek an in-person office evaluation/go to MAU at [facility] for any urgent or concerning symptoms.  Return in about 2 weeks (around 08/13/2019) for in-person/LOB/APP OK/needs Spanish translator, pt needs repeat US at [facility].   Shadonna Benedick, Odie Sera, NP

## 2019-08-13 ENCOUNTER — Encounter: Payer: Self-pay | Admitting: General Practice

## 2019-08-20 ENCOUNTER — Other Ambulatory Visit: Payer: Self-pay

## 2019-08-20 ENCOUNTER — Encounter: Payer: Self-pay | Admitting: Medical

## 2019-08-20 ENCOUNTER — Ambulatory Visit (INDEPENDENT_AMBULATORY_CARE_PROVIDER_SITE_OTHER): Payer: Self-pay | Admitting: Medical

## 2019-08-20 VITALS — BP 119/66 | HR 83 | Wt 210.0 lb

## 2019-08-20 DIAGNOSIS — O99211 Obesity complicating pregnancy, first trimester: Secondary | ICD-10-CM

## 2019-08-20 DIAGNOSIS — R87610 Atypical squamous cells of undetermined significance on cytologic smear of cervix (ASC-US): Secondary | ICD-10-CM

## 2019-08-20 DIAGNOSIS — Z3A33 33 weeks gestation of pregnancy: Secondary | ICD-10-CM

## 2019-08-20 DIAGNOSIS — Z3493 Encounter for supervision of normal pregnancy, unspecified, third trimester: Secondary | ICD-10-CM

## 2019-08-20 DIAGNOSIS — O09291 Supervision of pregnancy with other poor reproductive or obstetric history, first trimester: Secondary | ICD-10-CM

## 2019-08-20 DIAGNOSIS — Z8632 Personal history of gestational diabetes: Secondary | ICD-10-CM

## 2019-08-20 NOTE — Progress Notes (Signed)
   PRENATAL VISIT NOTE  Subjective:  Laura Brady is a 34 y.o. [phone] at 106w5d being seen today for ongoing prenatal care.  She is currently monitored for the following issues for this high-risk pregnancy and has Family history of diabetes mellitus (DM); History of COVID-19; Supervision of low-risk pregnancy; Obesity affecting pregnancy in first trimester; History of gestational diabetes in prior pregnancy, currently pregnant in first trimester; Atypical squamous cells of undetermined significance (ASCUS) on Papanicolaou smear of cervix; and Vitamin D deficiency on their problem list.  Patient reports white discharge.  Contractions: Irritability. Vag. Bleeding: None.  Movement: Present. Denies leaking of fluid.   The following portions of the patient's history were reviewed and updated as appropriate: allergies, current medications, past family history, past medical history, past social history, past surgical history and problem list.   Objective:   Vitals:   08/20/19 1509  BP: 119/66  Pulse: 83  Weight: 210 lb (95.3 kg)    Fetal Status: Fetal Heart Rate (bpm): 160   Movement: Present   Fundal Height: 34 cm  General:  Alert, oriented and cooperative. Patient is in no acute distress.  Skin: Skin is warm and dry. No rash noted.   Cardiovascular: Normal heart rate noted  Respiratory: Normal respiratory effort, no problems with respiration noted  Abdomen: Soft, gravid, appropriate for gestational age.  Pain/Pressure: Present     Pelvic: Cervical exam deferred        Extremities: Normal range of motion.  Edema: None  Mental Status: Normal mood and affect. Normal behavior. Normal judgment and thought content.   Assessment and Plan:  Pregnancy: [id number] at [redacted]w[redacted]d 1. Encounter for supervision of low-risk pregnancy in third trimester - Doing well - Follow-up US from [facility] reviewed   2. Obesity affecting pregnancy in first trimester - Taking BASA   3. Atypical squamous  cells of undetermined significance (ASCUS) on Papanicolaou smear of cervix - Co-testing in 3 years   4. History of gestational diabetes in prior pregnancy, currently pregnant in first trimester - Normal 2 hour this pregnancy  5. [redacted] weeks gestation of pregnancy   Preterm labor symptoms and general obstetric precautions including but not limited to vaginal bleeding, contractions, leaking of fluid and fetal movement were reviewed in detail with the patient. Please refer to After Visit Summary for other counseling recommendations.   Return in about 2 weeks (around 09/03/2019) for LOB, In-Person.  Future Appointments  Date Time Provider Department Center  11/19/2019  8:00 AM Romualdo Bolk, MD [facility] None    Vonzella Nipple, PA-C

## 2019-08-20 NOTE — Patient Instructions (Signed)
Evaluacin de los movimientos fetales Fetal Movement Counts Nombre del paciente: ________________________________________________ Laura Brady estimada: ____________________ Young Berry evaluacin de los movimientos fetales?  Una evaluacin de los movimientos fetales es el registro del nmero de veces que siente que el beb se mueve durante un cierto perodo de [name]. Esto tambin se puede denominar recuento de patadas fetales. Una evaluacin de movimientos fetales se recomienda a todas las embarazadas. Es posible que le indiquen que comience a Development worker, community los movimientos fetales desde la semana 28 de [place]. Preste atencin cuando sienta que el beb est ms activo. Podr detectar los ciclos en que el beb duerme y est despierto. Tambin podr detectar que ciertas cosas hacen que su beb se mueva ms. Deber realizar una evaluacin de los movimientos fetales en las siguientes situaciones:  Cuando el beb est ms activo habitualmente.  A la [employer], todos los [place]. Un buen momento para evaluar los movimientos fetales es cuando est descansando, despus de haber comido y bebido algo. Cmo debo contar los movimientos fetales? 1. Encuentre un lugar tranquilo y cmodo. Sintese o acustese de lado. 2. Anote la fecha, la hora de inicio y de finalizacin y la cantidad de movimientos que sinti entre esas dos horas. Lleve esta informacin a las visitas de control. 3. Anote la hora de inicio cuando Designer, industrial/product. 4. Cuente las pataditas, revoloteos, chasquidos, vueltas o pinchazos. Debe sentir al menos . 5. Puede dejar de contar despus de haber sentido 10 movimientos o de haber contado [employer]. Anote la hora de finalizacin. 6. Si no siente en 2horas, comunquese con su mdico para obtener ms indicaciones. Es posible que el mdico quiera realizar estudios adicionales para Company secretary del beb. Comunquese con un mdico si:  Siente  menos de en 2horas.  El beb no se mueve tanto como suele hacerlo. Fecha: ____________ [name] inicio: ____________ [name] finalizacin: ____________ Movimientos: ____________ [name]: ____________ [name] inicio: ____________ [name] finalizacin: ____________ Movimientos: ____________ [name]: ____________ [name] inicio: ____________ [name] finalizacin: ____________ Movimientos: ____________ [name]: ____________ [name] inicio: ____________ [name] finalizacin: ____________ Movimientos: ____________ [name]: ____________ [name] inicio: ____________ [name] de finalizacin: ____________ Movimientos: ____________ [name]: ____________ [name] inicio: ____________ [name] de finalizacin: ____________ Movimientos: ____________ [name]: ____________ [name] inicio: ____________ [name] de finalizacin: ____________ Movimientos: ____________ [name]: ____________ [name] inicio: ____________ [name] finalizacin: ____________ Movimientos: ____________ [name]: ____________ [name] inicio: ____________ [name] de finalizacin: ____________ Movimientos: ____________ Esta informacin no tiene como fin reemplazar el consejo del mdico. Asegrese de hacerle al mdico cualquier pregunta que tenga. Document Revised: 10/22/2018 Document Reviewed: 10/22/2018 Elsevier Patient Education  2020 [employer]. [employer] de Braxton Hicks Braxton Hicks Contractions Las contracciones del tero pueden presentarse durante todo el [place], [place] no siempre indican que la mujer est de [place]. Es posible que usted haya tenido contracciones de prctica llamadas "contracciones de [place]". A veces, se las confunde con el parto real. Qu son las contracciones de [place]? Las contracciones de [place] son espasmos que se producen en los msculos del tero antes del [place]. A diferencia de las contracciones del parto verdadero, estas no producen el agrandamiento (la dilatacin) ni el afinamiento del cuello  uterino. Hacia el final del embarazo [facility] las semanas [phone]), las contracciones de Braxton Hicks pueden presentarse ms seguido y tornarse ms intensas. A veces, resulta difcil distinguirlas del parto verdadero porque pueden ser [employer]. No debe sentirse avergonzada si concurre al  hospital con falso parto. En ocasiones, la nica forma de saber si el trabajo de parto es verdadero es que el mdico determine si hay cambios en el cuello del tero. El mdico le har un examen fsico y quizs le controle las contracciones. Si usted no est de parto verdadero, el examen debe indicar que el cuello uterino no est dilatado y que usted no ha roto bolsa. Si no hay otros problemas de salud asociados con su embarazo, no habr inconvenientes si la envan a su casa con un falso parto. Es posible que las contracciones de Braxton Hicks continen hasta que se desencadene el parto verdadero. Cmo diferenciar el trabajo de parto falso del verdadero Trabajo de parto verdadero  Las contracciones duran de 30a70segundos.  Las contracciones pueden tornarse muy regulares.  La molestia generalmente se siente en la parte superior del tero y se extiende hacia la zona baja del abdomen y hacia la cintura.  Las contracciones no desaparecen cuando usted camina.  Las contracciones generalmente se hacen ms intensas y aumentan en frecuencia.  El cuello uterino se dilata y se afina. Parto falso  En general, las contracciones son ms cortas y no tan intensas como las del parto verdadero.  En general, las contracciones son irregulares.  A menudo, las contracciones se sienten en la parte delantera de la parte baja del abdomen y en la ingle.  Las contracciones pueden desaparecer cuando usted camina o cambia de posicin mientras est acostada.  Las contracciones se vuelven ms dbiles y su duracin es menor a medida que transcurre el tiempo.  En general, el cuello uterino no se dilata ni se afina. Siga estas  indicaciones en su casa:   Tome los medicamentos de venta libre y los recetados solamente como se lo haya indicado el mdico.  Contine haciendo los ejercicios habituales y siga las dems indicaciones que el mdico le d.  Coma y beba con moderacin si cree que est de parto.  Si las contracciones de Braxton Hicks le provocan incomodidad: ? Cambie de posicin: si est acostada o descansando, camine; si est caminando, descanse. ? Sintese y descanse en una baera con agua tibia. ? Beba suficiente lquido como para mantener la orina de color amarillo plido. La deshidratacin puede provocar contracciones. ? Respire lenta y profundamente varias veces por hora.  Vaya a todas las visitas de control prenatales y de control como se lo haya indicado el mdico. Esto es importante. Comunquese con un mdico si:  Tiene fiebre.  Siente dolor constante en el abdomen. Solicite ayuda de inmediato si:  Las contracciones se intensifican, se hacen ms regulares y cercanas entre s.  Tiene una prdida de lquido por la vagina.  Elimina una mucosidad sanguinolenta (prdida del tapn mucoso).  Tiene una hemorragia vaginal.  Tiene un dolor en la zona lumbar que nunca tuvo antes.  Siente que la cabeza del beb empuja hacia abajo y ejerce presin en la zona plvica.  El beb no se mueve tanto como antes. Resumen  Las contracciones que se presentan antes del parto se conocen como contracciones de Braxton Hicks, falso parto o contracciones de prctica.  En general, las contracciones de Braxton Hicks son ms cortas, ms dbiles, con ms tiempo entre una y otra, y menos regulares que las contracciones del parto verdadero. Las contracciones del parto verdadero se intensifican progresivamente y se tornan regulares y ms frecuentes.  Para controlar la molestia que producen las contracciones de Braxton Hicks, puede cambiar de posicin, darse un bao templado y   descansar, beber mucha agua o practicar la  respiracin profunda. Esta informacin no tiene Theme park manager el consejo del mdico. Asegrese de hacerle al mdico cualquier pregunta que tenga. Document Revised: 04/06/2017 Document Reviewed: 08/07/2016 Elsevier Patient Education  2020 [employer].

## 2019-09-08 ENCOUNTER — Other Ambulatory Visit (HOSPITAL_COMMUNITY)
Admission: RE | Admit: 2019-09-08 | Discharge: 2019-09-08 | Disposition: A | Discharge status: Home / Self Care | Payer: Self-pay | Source: Ambulatory Visit

## 2019-09-08 ENCOUNTER — Ambulatory Visit (INDEPENDENT_AMBULATORY_CARE_PROVIDER_SITE_OTHER): Payer: Self-pay

## 2019-09-08 ENCOUNTER — Other Ambulatory Visit: Payer: Self-pay

## 2019-09-08 VITALS — BP 113/77 | HR 71 | Wt 209.0 lb

## 2019-09-08 DIAGNOSIS — Z3A36 36 weeks gestation of pregnancy: Secondary | ICD-10-CM

## 2019-09-08 DIAGNOSIS — O99211 Obesity complicating pregnancy, first trimester: Secondary | ICD-10-CM

## 2019-09-08 DIAGNOSIS — Z3493 Encounter for supervision of normal pregnancy, unspecified, third trimester: Secondary | ICD-10-CM

## 2019-09-08 DIAGNOSIS — Z789 Other specified health status: Secondary | ICD-10-CM

## 2019-09-08 NOTE — Patient Instructions (Signed)
Eleccin del mtodo anticonceptivo Contraception Choices La anticoncepcin, o los mtodos anticonceptivos, son medidas que se toman o maneras a fin de intentar no quedar embarazada. Mtodo anticonceptivo hormonal Este tipo de mtodo anticonceptivo contiene hormonas. A continuacin se mencionan algunos tipos de mtodos anticonceptivos hormonales:  Un tubo que se coloca debajo de la piel del brazo (implante). El tubo puede permanecer en el lugar durante 3aos.  Inyecciones que debe recibir cada 3meses.  Pldoras que debe tomar todos los das (pldoras anticonceptivas).  Un parche que se debe cambiar 1vez por semana durante 3semanas (parche anticonceptivo). Despus de ese tiempo, el parche se debe retirar durante 1semana.  Un anillo que se coloca en la vagina. El anillo se deja colocado durante 3 semanas. Luego, se debe retirar de la vagina durante 1semana. Luego, se coloca un nuevo anillo en la vagina.  Pldoras que se deben tomar despus de tener sexo sin proteccin (pldoras anticonceptivas de emergencia). Mtodos anticonceptivos de barrera A continuacin se mencionan algunos tipos de mtodos anticonceptivos de barrera:  Una cubierta delgada que se coloca sobre el pene antes de tener sexo (preservativo masculino). La cubierta se desecha despus de tener sexo.  Una cubierta blanda y suelta que se coloca en la vagina antes de tener sexo (preservativo femenino). La cubierta se desecha despus de tener sexo.  Un dispositivo de goma que se aplica sobre el cuello uterino (diafragma). Este dispositivo debe fabricarse para usted. Se coloca en la vagina antes de tener sexo. Se debe dejar colocado durante 6 a 8horas despus de tener sexo. Se debe retirar en un plazo de 24horas.  Un capuchn pequeo y suave que se fija sobre el cuello uterino (capuchn cervical). Este capuchn debe fabricarse para usted. Se debe dejar colocado durante 6 a 8horas despus de tener sexo. Se debe retirar en un  plazo de 48horas.  Una esponja que se coloca en la vagina antes de tener sexo. Se debe dejar colocada durante al menos 6horas despus de tener sexo. Se debe retirar en un plazo de 30horas. Luego, debe desecharse.  Una sustancia qumica que destruye o impide que los espermatozoides ingresen al tero (espermicida). Se puede presentar en forma de pldora, crema, gel o espuma y se debe colocar en la vagina. La sustancia qumica se debe usar al menos de 10 a 15minutos antes de tener sexo. Dispositivo anticonceptivo intrauterino (DIU) El DIU es un pequeo dispositivo plstico en forma de T. Se coloca en el interior del tero. Existen dos tipos diferentes:  DIU hormonal. Este tipo puede permanecer colocado durante 3 a 5aos.  DIU de cobre. Este tipo puede permanecer colocado durante 10aos. Mtodos anticonceptivos permanentes A continuacin se mencionan algunos tipos de mtodos anticonceptivos permanentes:  Ciruga para obstruir las trompas de [place].  Colocacin de un dispositivo en cada una de las trompas de [place].  Ciruga para atar los conductos que transportan el esperma (vasectoma). Mtodos anticonceptivos por planificacin natural A continuacin se mencionan algunos mtodos anticonceptivos por planificacin natural:  No tener sexo en los das frtiles de la mujer.  Usar un calendario a fin de: ? Llevar un registro de la duracin de cada perodo. ? Determinar en qu das se podra producir el embarazo. ? Planificar no tener sexo en los das en que se podra producir el embarazo.  Reconocer los sntomas de la ovulacin y no tener sexo durante la ovulacin. Una manera en que la mujer puede detectar la ovulacin es controlarse la temperatura.  Esperar para tener sexo hasta despus de la   ovulacin. Resumen  La anticoncepcin, o los mtodos anticonceptivos, son medidas que se toman o maneras a fin de intentar no quedar embarazada.  Los mtodos anticonceptivos hormonales  incluyen implantes, inyecciones, pldoras, parches, anillos vaginales y pldoras anticonceptivas de emergencia.  Los mtodos anticonceptivos de barrera pueden incluir preservativos masculinos, preservativos femeninos, diafragmas, capuchones cervicales, esponjas y espermicidas.  Existen dos tipos diferentes de DIU (dispositivo intrauterino) anticonceptivo. Un DIU puede colocarse en el tero de una mujer para evitar el embarazo durante 3 a 5 aos.  La esterilizacin permanente puede realizarse mediante un procedimiento para hombres, mujeres o ambos.  Los mtodos anticonceptivos por planificacin familiar natural incluyen no tener sexo en los das frtiles de la mujer. Esta informacin no tiene como fin reemplazar el consejo del mdico. Asegrese de hacerle al mdico cualquier pregunta que tenga. Document Revised: 08/29/2017 Document Reviewed: 08/16/2016 Elsevier Patient Education  2020 Elsevier Inc.  

## 2019-09-08 NOTE — Progress Notes (Signed)
   LOW-RISK PREGNANCY OFFICE VISIT  Patient name: Laura Brady MRN [id number]  Date of birth: April 30, 1985 Chief Complaint:   Routine Prenatal Visit  Subjective:   Laura Brady is a 34 y.o. [phone] female at [redacted]w[redacted]d with an Estimated Date of Delivery: 10/03/19 being seen today for ongoing management of a low-risk pregnancy aeb has Family history of diabetes mellitus (DM); History of COVID-19; Supervision of low-risk pregnancy; Obesity affecting pregnancy in first trimester; History of gestational diabetes in prior pregnancy, currently pregnant in first trimester; Atypical squamous cells of undetermined significance (ASCUS) on Papanicolaou smear of cervix; and Vitamin D deficiency on their problem list.  Patient presents today with complaint of pelvic pressure. However patient states she feels this is normal and related to pregnancy. Patient endorses fetal movement and contractions. Patient reports increased vaginal discharge, but denies odor, color, or irritation. Patient denies other vaginal concerns including leaking of fluid and bleeding.  Contractions: Irritability. Vag. Bleeding: None.  Movement: Present.  Reviewed past medical,surgical, social, obstetrical and family history as well as problem list, medications and allergies.  Objective   Vitals:   09/08/19 0825  BP: 113/77  Pulse: 71  Weight: 209 lb (94.8 kg)  Body mass index is 34.78 kg/m.  Total Weight Gain:27 lb (12.2 kg)         Physical Examination:   General appearance: Well appearing, and in no distress  Mental status: Alert, oriented to person, place, and time  Skin: Warm & dry  Cardiovascular: Normal heart rate noted  Respiratory: Normal respiratory effort, no distress  Abdomen: Soft, gravid, nontender, AGA with Fundal height of Fundal Height: 38 cm  Pelvic: Cervical exam performed  Dilation: Closed Effacement (%): Thick Station: Ballotable Presentation: Undeterminable *External Os  ~3cm  Extremities: Edema: None  Fetal Status: Fetal Heart Rate (bpm): 159  Movement: Present   No results found for this or any previous visit (from the past 24 hour(s)).  Assessment & Plan:  Low-risk pregnancy of a 34 y.o., [id number] at [redacted]w[redacted]d with an Estimated Date of Delivery: 10/03/19   1. Encounter for supervision of low-risk pregnancy in third trimester -Labs as below. -Educated on GBS bacteria including what it is, why we test, and how and when we treat if needed. -Discussed and reviewed postpartum planning including contraception, pediatricians, and infant feedings. *Patient desires to breastfeed and is considering vasectomy for contraception.  2. Obesity affecting pregnancy in first trimester -Has discontinued at 36 weeks.  3. Language barrier affecting health care -[employer] interpreter used: Esmeralda [id number]  4. [redacted] weeks gestation of pregnancy -RTO in 1 weeks    Meds: No orders of the defined types were placed in this encounter.  Labs/procedures today:  Lab Orders     Culture, beta strep (group b only)     GC/Chlamydia Probe Amp   Reviewed: Term labor symptoms and general obstetric precautions including but not limited to vaginal bleeding, contractions, leaking of fluid and fetal movement were reviewed in detail with the patient.  All questions were answered.  Follow-up: Return in about 1 week (around 09/15/2019) for LROB.  Orders Placed This Encounter  Procedures  . Culture, beta strep (group b only)  . GC/Chlamydia Probe Amp   Cherre Robins MSN, CNM 09/08/2019

## 2019-09-08 NOTE — Addendum Note (Signed)
Addended by: Ernestina Patches on: 09/08/2019 01:30 PM   Modules accepted: Orders

## 2019-09-09 LAB — GC/CHLAMYDIA PROBE AMP (~~LOC~~) NOT AT ARMC
Chlamydia: NEGATIVE
Comment: NEGATIVE
Comment: NORMAL
Neisseria Gonorrhea: NEGATIVE

## 2019-09-11 LAB — CULTURE, BETA STREP (GROUP B ONLY): Strep Gp B Culture: NEGATIVE

## 2019-09-16 ENCOUNTER — Ambulatory Visit (INDEPENDENT_AMBULATORY_CARE_PROVIDER_SITE_OTHER): Payer: Medicaid Other | Admitting: Advanced Practice Midwife

## 2019-09-16 ENCOUNTER — Inpatient Hospital Stay (HOSPITAL_COMMUNITY)
Admission: AD | Admit: 2019-09-16 | Discharge: 2019-09-18 | DRG: 807 | Disposition: A | Discharge status: Home / Self Care | Payer: Medicaid Other | Attending: Family Medicine | Admitting: Family Medicine

## 2019-09-16 ENCOUNTER — Encounter: Payer: Self-pay | Admitting: Advanced Practice Midwife

## 2019-09-16 ENCOUNTER — Encounter (HOSPITAL_COMMUNITY): Payer: Self-pay | Admitting: Family Medicine

## 2019-09-16 ENCOUNTER — Inpatient Hospital Stay (HOSPITAL_COMMUNITY): Payer: Medicaid Other | Admitting: Anesthesiology

## 2019-09-16 ENCOUNTER — Other Ambulatory Visit: Payer: Self-pay

## 2019-09-16 VITALS — BP 112/72 | HR 75 | Wt 212.1 lb

## 2019-09-16 DIAGNOSIS — Z3493 Encounter for supervision of normal pregnancy, unspecified, third trimester: Secondary | ICD-10-CM

## 2019-09-16 DIAGNOSIS — Z3A37 37 weeks gestation of pregnancy: Secondary | ICD-10-CM | POA: Diagnosis not present

## 2019-09-16 DIAGNOSIS — O4292 Full-term premature rupture of membranes, unspecified as to length of time between rupture and onset of labor: Principal | ICD-10-CM | POA: Diagnosis present

## 2019-09-16 DIAGNOSIS — Z8632 Personal history of gestational diabetes: Secondary | ICD-10-CM

## 2019-09-16 DIAGNOSIS — Z20822 Contact with and (suspected) exposure to covid-19: Secondary | ICD-10-CM | POA: Diagnosis present

## 2019-09-16 DIAGNOSIS — Z8616 Personal history of COVID-19: Secondary | ICD-10-CM

## 2019-09-16 DIAGNOSIS — O99211 Obesity complicating pregnancy, first trimester: Secondary | ICD-10-CM

## 2019-09-16 DIAGNOSIS — Z3490 Encounter for supervision of normal pregnancy, unspecified, unspecified trimester: Secondary | ICD-10-CM

## 2019-09-16 DIAGNOSIS — O4212 Full-term premature rupture of membranes, onset of labor more than 24 hours following rupture: Secondary | ICD-10-CM | POA: Diagnosis not present

## 2019-09-16 LAB — POCT URINALYSIS DIP (DEVICE)
Bilirubin Urine: NEGATIVE
Glucose, UA: NEGATIVE mg/dL
Hgb urine dipstick: NEGATIVE
Ketones, ur: NEGATIVE mg/dL
Nitrite: NEGATIVE
Protein, ur: NEGATIVE mg/dL
Specific Gravity, Urine: 1.02 (ref 1.005–1.030)
Urobilinogen, UA: 0.2 mg/dL (ref 0.0–1.0)
pH: 6 (ref 5.0–8.0)

## 2019-09-16 LAB — SARS CORONAVIRUS 2 BY RT PCR (HOSPITAL ORDER, PERFORMED IN ~~LOC~~ HOSPITAL LAB): SARS Coronavirus 2: NEGATIVE

## 2019-09-16 LAB — CBC
HCT: 35.8 % — ABNORMAL LOW (ref 36.0–46.0)
Hemoglobin: 11.5 g/dL — ABNORMAL LOW (ref 12.0–15.0)
MCH: 27 pg (ref 26.0–34.0)
MCHC: 32.1 g/dL (ref 30.0–36.0)
MCV: 84 fL (ref 80.0–100.0)
Platelets: 187 10*3/uL (ref 150–400)
RBC: 4.26 MIL/uL (ref 3.87–5.11)
RDW: 14.8 % (ref 11.5–15.5)
WBC: 7.2 10*3/uL (ref 4.0–10.5)
nRBC: 0 % (ref 0.0–0.2)

## 2019-09-16 LAB — TYPE AND SCREEN
ABO/RH(D): A POS
Antibody Screen: NEGATIVE

## 2019-09-16 MED ORDER — SOD CITRATE-CITRIC ACID 500-334 MG/5ML PO SOLN: 30.0000 mL | ORAL | Status: DC | PRN

## 2019-09-16 MED ORDER — FENTANYL-BUPIVACAINE-NACL 0.5-0.125-0.9 MG/250ML-% EP SOLN
EPIDURAL | Status: AC
  Filled 2019-09-16: qty 250

## 2019-09-16 MED ORDER — FENTANYL CITRATE (PF) 100 MCG/2ML IJ SOLN: 50.0000 ug | INTRAMUSCULAR | Status: DC | PRN

## 2019-09-16 MED ORDER — FLEET ENEMA 7-19 GM/118ML RE ENEM: 1.0000 | ENEMA | Freq: Every day | RECTAL | Status: DC | PRN

## 2019-09-16 MED ORDER — ACETAMINOPHEN 325 MG PO TABS: 650.0000 mg | ORAL_TABLET | ORAL | Status: DC | PRN

## 2019-09-16 MED ORDER — OXYTOCIN-SODIUM CHLORIDE 30-0.9 UT/500ML-% IV SOLN
2.5000 [IU]/h | INTRAVENOUS | Status: DC
  Filled 2019-09-16: qty 500

## 2019-09-16 MED ORDER — OXYTOCIN BOLUS FROM INFUSION
333.0000 mL | Freq: Once | INTRAVENOUS | Status: AC
  Administered 2019-09-17: 333 mL via INTRAVENOUS

## 2019-09-16 MED ORDER — LACTATED RINGERS IV SOLN: 500.0000 mL | INTRAVENOUS | Status: DC | PRN

## 2019-09-16 MED ORDER — LACTATED RINGERS IV SOLN: INTRAVENOUS | Status: DC

## 2019-09-16 MED ORDER — LIDOCAINE HCL (PF) 1 % IJ SOLN: 30.0000 mL | INTRAMUSCULAR | Status: DC | PRN

## 2019-09-16 MED ORDER — ONDANSETRON HCL 4 MG/2ML IJ SOLN: 4.0000 mg | Freq: Four times a day (QID) | INTRAMUSCULAR | Status: DC | PRN

## 2019-09-16 NOTE — MAU Note (Signed)
PT states SROM clear at 1930.  Cntx all day, have gotten worse. +FM

## 2019-09-16 NOTE — Progress Notes (Signed)
   PRENATAL VISIT NOTE  Subjective:  Laura Brady is a 34 y.o. [phone] at [redacted]w[redacted]d being seen today for ongoing prenatal care.  She is currently monitored for the following issues for this low-risk pregnancy and has Family history of diabetes mellitus (DM); History of COVID-19; Supervision of low-risk pregnancy; Obesity affecting pregnancy in first trimester; History of gestational diabetes in prior pregnancy, currently pregnant in first trimester; Atypical squamous cells of undetermined significance (ASCUS) on Papanicolaou smear of cervix; and Vitamin D deficiency on their problem list.  Patient reports occasional contractions.  Contractions: Irritability. Vag. Bleeding: Bloody Show.  Movement: Present. Denies leaking of fluid.   The following portions of the patient's history were reviewed and updated as appropriate: allergies, current medications, past family history, past medical history, past social history, past surgical history and problem list.   Objective:   Vitals:   09/16/19 1414  BP: 112/72  Pulse: 75  Weight: 212 lb 1.6 oz (96.2 kg)    Fetal Status: Fetal Heart Rate (bpm): 147 Fundal Height: 39 cm Movement: Present  Presentation: Vertex  General:  Alert, oriented and cooperative. Patient is in no acute distress.  Skin: Skin is warm and dry. No rash noted.   Cardiovascular: Normal heart rate noted  Respiratory: Normal respiratory effort, no problems with respiration noted  Abdomen: Soft, gravid, appropriate for gestational age.  Pain/Pressure: Present     Pelvic: Cervical exam performed in the presence of a chaperone Dilation: 2 Effacement (%): 50 Station: -3  Extremities: Normal range of motion.  Edema: None  Mental Status: Normal mood and affect. Normal behavior. Normal judgment and thought content.   Assessment and Plan:  Pregnancy: [id number] at [redacted]w[redacted]d 1. Encounter for supervision of low-risk pregnancy in third trimester   2. [redacted] weeks gestation of pregnancy -  Patient had Moderna Covid vaccine on 08/02/2019 and 08/29/2019   Term labor symptoms and general obstetric precautions including but not limited to vaginal bleeding, contractions, leaking of fluid and fetal movement were reviewed in detail with the patient. Please refer to After Visit Summary for other counseling recommendations.   Return in about 1 week (around 09/23/2019) for virtual visit .  Future Appointments  Date Time Provider Department Center  09/23/2019  3:15 PM [facility]  09/30/2019  1:55 PM [facility] Seton, [facility]  11/19/2019  8:00 AM Romualdo Bolk, MD [facility] None   Thressa Sheller DNP, CNM  09/16/19  2:50 PM

## 2019-09-16 NOTE — H&P (Signed)
OBSTETRIC ADMISSION HISTORY AND PHYSICAL  Laura Brady is a 34 y.o. female [phone] with IUP at [redacted]w[redacted]d by LMP presenting for PROM. Reports gush of fluid at around 1930 today and leaking since then. She also reports ctx that started earlier, have become more intense. She reports +FMs, no VB, no blurry vision, headaches or peripheral edema, and RUQ pain.  She plans on breast feeding. She requests BTL or Vasectomy for birth control. She received her prenatal care at [facility]   Dating: By LMP --->  Estimated Date of Delivery: 10/03/19  Sono:    @[redacted]w[redacted]d , CWD, normal anatomy, cephalic presentation, posterior placeneta, 2085g, 74.7% EFW   Prenatal History/Complications:  BMI 35  ASCUS, HPV neg pap smear at initial OB   Past Medical History: Past Medical History:  Diagnosis Date   COVID-19 11/2018   Medical history non-contributory     Past Surgical History: Past Surgical History:  Procedure Laterality Date   NO PAST SURGERIES      Obstetrical History: OB History    Gravida  5   Para  3   Term  3   Preterm  0   AB  1   Living  3     SAB  1   TAB  0   Ectopic  0   Multiple  0   Live Births  3           Social History Social History   Socioeconomic History   Marital status: Single    Spouse name: Not on file   Number of children: Not on file   Years of education: Not on file   Highest education level: Not on file  Occupational History   Not on file  Tobacco Use   Smoking status: Never Smoker   Smokeless tobacco: Never Used  Vaping Use   Vaping Use: Never used  Substance and Sexual Activity   Alcohol use: Not Currently    Comment: socially   Drug use: No   Sexual activity: Yes    Partners: Male    Birth control/protection: None  Other Topics Concern   Not on file  Social History Narrative   Marital status: dating seriously x 9 years      Children: 3 children (9, 4,1)      Lives: with boyfriend, 3 chi.ldren      Employment:  unemployed      Tobacco: none      Alcohol: weekends/once per month      Drugs: none      Exercise:  Some; 8,3.      Sexual activity: no STDs.         Social Determinants of Health   Financial Resource Strain:    Difficulty of Paying Living Expenses: Not on file  Food Insecurity: No Food Insecurity   Worried About Photographer in the Last Year: Never true   Ran Out of Food in the Last Year: Never true  Transportation Needs: No Transportation Needs   Lack of Transportation (Medical): No   Lack of Transportation (Non-Medical): No  Physical Activity:    Days of Exercise per Week: Not on file   Minutes of Exercise per Session: Not on file  Stress:    Feeling of Stress : Not on file  Social Connections:    Frequency of Communication with Friends and Family: Not on file   Frequency of Social Gatherings with Friends and Family: Not on file   Attends Religious Services:  Not on file   Active Member of Clubs or Organizations: Not on file   Attends Club or Organization Meetings: Not on file   Marital Status: Not on file    Family History: Family History  Problem Relation Age of Onset   Diabetes Mother    Hyperlipidemia Mother    Hypertension Mother    Brain cancer Mother    Uterine cancer Mother    Stroke Father    Diabetes Father    Diabetes Brother     Allergies: No Known Allergies  Medications Prior to Admission  Medication Sig Dispense Refill Last Dose   aspirin EC 81 MG tablet Take 81 mg by mouth daily. (Patient not taking: Reported on 09/16/2019)      Cholecalciferol (VITAMIN D3 PO) Take by mouth. (Patient not taking: Reported on 09/16/2019)      Prenatal Vit-Fe Fumarate-FA (PRENATAL MULTIVITAMIN) TABS tablet Take 1 tablet by mouth daily at 12 noon.        Review of Systems   All systems reviewed and negative except as stated in HPI  Blood pressure 127/68, pulse 80, temperature 98.3 F (36.8 C), temperature source Oral,  resp. rate 18, last menstrual period 12/27/2018, SpO2 98 %, unknown if currently breastfeeding. General appearance: alert, cooperative and appears stated age Lungs: clear to auscultation bilaterally Heart: regular rate and rhythm Abdomen: soft, non-tender; bowel sounds normal Pelvic: 3/50/-3  Extremities: Homans sign is negative, no sign of DVT Presentation: cephalic by US  Fetal monitoring: Baseline 150, mod variability, pos accels, neg decels  Uterine activity q3-5 min  Dilation: 3 Effacement (%): 50 Station: -3 Exam by:: T LYTLE RN  [place] By Grace Bushy CNM demonstrates cephalic   Prenatal labs: ABO, Rh: A/Positive/-- (03/16 1428) Antibody: Negative (03/16 1428) Rubella: 1.26 (03/16 1428) RPR: Non Reactive (07/08 0902)  HBsAg: Negative (03/16 1428)  HIV: Non Reactive (07/08 0902)  GBS: Negative/-- (08/30 0857)  2 hr Glucola normal Genetic screening  declined Anatomy US normal  Prenatal Transfer Tool  Maternal Diabetes: No Genetic Screening: declined Maternal Ultrasounds/Referrals: Normal Fetal Ultrasounds or other Referrals:  None Maternal Substance Abuse:  No Significant Maternal Medications:  None Significant Maternal Lab Results: Group B Strep negative  Results for orders placed or performed in visit on 09/16/19 (from the past 24 hour(s))  POCT urinalysis dip (device)   Collection Time: 09/16/19  2:33 PM  Result Value Ref Range   Glucose, UA NEGATIVE NEGATIVE mg/dL   Bilirubin Urine NEGATIVE NEGATIVE   Ketones, ur NEGATIVE NEGATIVE mg/dL   Specific Gravity, Urine 1.020 1.005 - 1.030   Hgb urine dipstick NEGATIVE NEGATIVE   pH 6.0 5.0 - 8.0   Protein, ur NEGATIVE NEGATIVE mg/dL   Urobilinogen, UA 0.2 0.0 - 1.0 mg/dL   Nitrite NEGATIVE NEGATIVE   Leukocytes,Ua TRACE (A) NEGATIVE    Patient Active Problem List   Diagnosis Date Noted   Vitamin D deficiency 04/21/2019   Atypical squamous cells of undetermined significance (ASCUS) on Papanicolaou smear of cervix  03/31/2019   Obesity affecting pregnancy in first trimester 03/25/2019   History of gestational diabetes in prior pregnancy, currently pregnant in first trimester 03/25/2019   Supervision of low-risk pregnancy 03/06/2019   History of COVID-19 02/11/2019   Family history of diabetes mellitus (DM) 11/13/2018    Assessment/Plan:  Dilynn Munroe is a 34 y.o. [id number] at [redacted]w[redacted]d here for PROM, appears to be progressing into early labor   #Labor: ROM at ~1900, expectant mgmt for now. Confirmed cephalic  by [place]. Can start pitocin prn.   #Pain: Epidural prn  #FWB: Cat I tracing  #ID:  GBS neg  #MOF: breast  #MOC:no  #Circ:  No     Gita Kudo, MD  09/16/2019, 9:47 PM

## 2019-09-17 ENCOUNTER — Encounter (HOSPITAL_COMMUNITY): Payer: Self-pay | Admitting: Family Medicine

## 2019-09-17 DIAGNOSIS — O4212 Full-term premature rupture of membranes, onset of labor more than 24 hours following rupture: Secondary | ICD-10-CM

## 2019-09-17 DIAGNOSIS — Z3A37 37 weeks gestation of pregnancy: Secondary | ICD-10-CM

## 2019-09-17 LAB — RPR: RPR Ser Ql: NONREACTIVE

## 2019-09-17 MED ORDER — SENNOSIDES-DOCUSATE SODIUM 8.6-50 MG PO TABS
2.0000 | ORAL_TABLET | ORAL | Status: DC
  Administered 2019-09-18: 2 via ORAL
  Filled 2019-09-17: qty 2

## 2019-09-17 MED ORDER — DIBUCAINE (PERIANAL) 1 % EX OINT: 1.0000 "application " | TOPICAL_OINTMENT | CUTANEOUS | Status: DC | PRN

## 2019-09-17 MED ORDER — IBUPROFEN 600 MG PO TABS
600.0000 mg | ORAL_TABLET | Freq: Four times a day (QID) | ORAL | Status: DC
  Administered 2019-09-17 – 2019-09-18 (×6): 600 mg via ORAL
  Filled 2019-09-17 (×6): qty 1

## 2019-09-17 MED ORDER — COCONUT OIL OIL: 1.0000 "application " | TOPICAL_OIL | Status: DC | PRN

## 2019-09-17 MED ORDER — LACTATED RINGERS IV SOLN: 500.0000 mL | Freq: Once | INTRAVENOUS | Status: DC

## 2019-09-17 MED ORDER — FENTANYL CITRATE (PF) 2500 MCG/50ML IJ SOLN
INTRAMUSCULAR | Status: DC | PRN
Start: 2019-09-16 — End: 2019-09-17
  Administered 2019-09-16: 12 mL/h via EPIDURAL

## 2019-09-17 MED ORDER — ONDANSETRON HCL 4 MG/2ML IJ SOLN: 4.0000 mg | INTRAMUSCULAR | Status: DC | PRN

## 2019-09-17 MED ORDER — ONDANSETRON HCL 4 MG PO TABS: 4.0000 mg | ORAL_TABLET | ORAL | Status: DC | PRN

## 2019-09-17 MED ORDER — DIPHENHYDRAMINE HCL 25 MG PO CAPS: 25.0000 mg | ORAL_CAPSULE | Freq: Four times a day (QID) | ORAL | Status: DC | PRN

## 2019-09-17 MED ORDER — WITCH HAZEL-GLYCERIN EX PADS: 1.0000 "application " | MEDICATED_PAD | CUTANEOUS | Status: DC | PRN

## 2019-09-17 MED ORDER — LIDOCAINE HCL (PF) 1 % IJ SOLN
INTRAMUSCULAR | Status: DC | PRN
  Administered 2019-09-16 (×2): 4 mL via EPIDURAL

## 2019-09-17 MED ORDER — TETANUS-DIPHTH-ACELL PERTUSSIS 5-2.5-18.5 LF-MCG/0.5 IM SUSP: 0.5000 mL | Freq: Once | INTRAMUSCULAR | Status: DC

## 2019-09-17 MED ORDER — BENZOCAINE-MENTHOL 20-0.5 % EX AERO: 1.0000 "application " | INHALATION_SPRAY | CUTANEOUS | Status: DC | PRN

## 2019-09-17 MED ORDER — SIMETHICONE 80 MG PO CHEW: 80.0000 mg | CHEWABLE_TABLET | ORAL | Status: DC | PRN

## 2019-09-17 MED ORDER — PRENATAL MULTIVITAMIN CH
1.0000 | ORAL_TABLET | Freq: Every day | ORAL | Status: DC
  Administered 2019-09-17 – 2019-09-18 (×2): 1 via ORAL
  Filled 2019-09-17 (×2): qty 1

## 2019-09-17 MED ORDER — DOCUSATE SODIUM 100 MG PO CAPS
100.0000 mg | ORAL_CAPSULE | Freq: Two times a day (BID) | ORAL | Status: DC
  Administered 2019-09-17 – 2019-09-18 (×2): 100 mg via ORAL
  Filled 2019-09-17 (×2): qty 1

## 2019-09-17 MED ORDER — ACETAMINOPHEN 325 MG PO TABS: 650.0000 mg | ORAL_TABLET | ORAL | Status: DC | PRN

## 2019-09-17 NOTE — Progress Notes (Signed)
Labor Progress Note Laura Brady is a 34 y.o. [phone] at [redacted]w[redacted]d presented for PROM now in early labor S: Feeling more comfortable with epidural, still feeling some ctx   O:  BP (!) 113/59   Pulse 65   Temp 97.8 F (36.6 C) (Oral)   Resp 18   LMP 12/27/2018   SpO2 98% Comment: room air EFM: 140/mod variability/pos accels/ few variable decels   CVE: Dilation: 5 Effacement (%): 50 Cervical Position: Posterior Station: -2 Presentation: Vertex Exam by:: B McClam, RN    A&P: 34 y.o. [id number] [redacted]w[redacted]d presented w prom now in early labor  #Labor: Progressing well. Anticipate SVD.  #Pain: epidural in place #FWB: cat I  #GBS negative    Gita Kudo, MD 1:00 AM

## 2019-09-17 NOTE — Anesthesia Procedure Notes (Signed)
Epidural Patient location during procedure: OB Start time: 09/16/2019 11:40 PM End time: 09/16/2019 11:47 PM  Staffing Anesthesiologist: Kaylyn Layer, MD Performed: anesthesiologist   Preanesthetic Checklist Completed: patient identified, IV checked, risks and benefits discussed, monitors and equipment checked, pre-op evaluation and timeout performed  Epidural Patient position: sitting Prep: DuraPrep and site prepped and draped Patient monitoring: continuous pulse ox, blood pressure and heart rate Approach: midline Location: L3-L4 Injection technique: LOR air  Needle:  Needle type: Tuohy  Needle gauge: 17 G Needle length: 9 cm Needle insertion depth: 6 cm Catheter type: closed end flexible Catheter size: 19 Gauge Catheter at skin depth: 11 cm Test dose: negative and Other (1% lidocaine)  Assessment Events: blood not aspirated, injection not painful, no injection resistance, no paresthesia and negative IV test  Additional Notes Patient identified. Risks, benefits, and alternatives discussed with patient including but not limited to bleeding, infection, nerve damage, paralysis, failed block, incomplete pain control, headache, blood pressure changes, nausea, vomiting, reactions to medication, itching, and postpartum back pain. Confirmed with bedside nurse the patient's most recent platelet count. Confirmed with patient that they are not currently taking any anticoagulation, have any bleeding history, or any family history of bleeding disorders. Patient expressed understanding and wished to proceed. All questions were answered. Sterile technique was used throughout the entire procedure. Please see nursing notes for vital signs.   2 attempts required, first catheter placed with crisp LOR but unable to flush catheter despite replacing clamp. Catheter removed and next attempt with crisp LOR, new catheter flushes easily. Test dose was given through epidural catheter and negative prior to  continuing to dose epidural or start infusion. Warning signs of high block given to the patient including shortness of breath, tingling/numbness in hands, complete motor block, or any concerning symptoms with instructions to call for help. Patient was given instructions on fall risk and not to get out of bed. All questions and concerns addressed with instructions to call with any issues or inadequate analgesia.  Reason for block:procedure for pain

## 2019-09-17 NOTE — Anesthesia Preprocedure Evaluation (Signed)
Anesthesia Evaluation  Patient identified by MRN, date of birth, ID band Patient awake    Reviewed: Allergy & Precautions, Patient's Chart, lab work & pertinent test results  History of Anesthesia Complications Negative for: history of anesthetic complications  Airway Mallampati: II  TM Distance: >3 FB Neck ROM: Full    Dental no notable dental hx.    Pulmonary neg pulmonary ROS,    Pulmonary exam normal        Cardiovascular negative cardio ROS Normal cardiovascular exam     Neuro/Psych negative neurological ROS  negative psych ROS   GI/Hepatic negative GI ROS, Neg liver ROS,   Endo/Other  negative endocrine ROS  Renal/GU negative Renal ROS  negative genitourinary   Musculoskeletal negative musculoskeletal ROS (+)   Abdominal   Peds  Hematology negative hematology ROS (+)   Anesthesia Other Findings Day of surgery medications reviewed with patient.  Reproductive/Obstetrics (+) Pregnancy                             Anesthesia Physical Anesthesia Plan  ASA: II  Anesthesia Plan: Epidural   Post-op Pain Management:    Induction:   PONV Risk Score and Plan: Treatment may vary due to age or medical condition  Airway Management Planned: Natural Airway  Additional Equipment:   Intra-op Plan:   Post-operative Plan:   Informed Consent: I have reviewed the patients History and Physical, chart, labs and discussed the procedure including the risks, benefits and alternatives for the proposed anesthesia with the patient or authorized representative who has indicated his/her understanding and acceptance.     Interpreter used for interveiw  Plan Discussed with:   Anesthesia Plan Comments:         Anesthesia Quick Evaluation  

## 2019-09-17 NOTE — Anesthesia Postprocedure Evaluation (Signed)
Anesthesia Post Note  Patient: Laura Brady  Procedure(s) Performed: AN AD HOC LABOR EPIDURAL     Patient location during evaluation: Mother Baby Anesthesia Type: Epidural Level of consciousness: awake and alert Pain management: pain level controlled Vital Signs Assessment: post-procedure vital signs reviewed and stable Respiratory status: spontaneous breathing, nonlabored ventilation and respiratory function stable Cardiovascular status: stable Postop Assessment: no headache, no backache and epidural receding Anesthetic complications: no   No complications documented.  Last Vitals:  Vitals:   09/17/19 0613 09/17/19 1040  BP: (!) 111/59 (!) 113/48  Pulse: 64 68  Resp: 18 20  Temp: 36.8 C 36.9 C  SpO2: 100%     Last Pain:  Vitals:   09/17/19 1213  TempSrc:   PainSc: 5    Pain Goal:                   [facility] Records

## 2019-09-17 NOTE — Discharge Summary (Signed)
Postpartum Discharge Summary       Patient Name: Laura Brady DOB: 1985-08-10 MRN: [id number]  Date of admission: 09/16/2019 Delivery date:09/17/2019  Delivering provider: Janet Berlin  Date of discharge: 09/18/2019  Admitting diagnosis: Supervision of normal pregnancy [Z34.90] Intrauterine pregnancy: [redacted]w[redacted]d    Secondary diagnosis:  Active Problems:   Supervision of normal pregnancy  Additional problems: none    Discharge diagnosis: Term Pregnancy Delivered                                              Post partum procedures:none Augmentation: N/A Complications: None  Hospital course: Onset of Labor With Vaginal Delivery      34y.o. yo [id number] 32w5das admitted in Latent Labor on 09/16/2019. Patient had an uncomplicated labor course as follows:  Membrane Rupture Time/Date: 9:40 PM ,09/16/2019   Delivery Method:Vaginal, Spontaneous  Episiotomy: None  Lacerations:  None  Patient had an uncomplicated postpartum course.  She is ambulating, tolerating a regular diet, passing flatus, and urinating well. Patient is discharged home in stable condition on 09/18/19.  Newborn Data: Birth date:09/17/2019  Birth time:3:08 AM  Gender:Female  Living status:Living  Apgars:9 ,9  Weight:3586 g   Magnesium Sulfate received: No BMZ received: No Rhophylac:N/A MMR:N/A T-DaP:Given prenatally Flu: No Transfusion:No  Physical exam  Vitals:   09/17/19 1427 09/17/19 1834 09/17/19 2132 09/18/19 0541  BP: (!) 120/59 (!) 98/56 (!) 110/51 115/62  Pulse: 63 65 63 64  Resp: '18 18 18 18  ' Temp: 98.5 F (36.9 C) 98.3 F (36.8 C) 97.8 F (36.6 C) 97.9 F (36.6 C)  TempSrc: Oral Oral Oral Oral  SpO2:       General: alert, cooperative and no distress Lochia: appropriate Uterine Fundus: firm Incision: N/A DVT Evaluation: No evidence of DVT seen on physical exam. Labs: Lab Results  Component Value Date   WBC 7.2 09/16/2019   HGB 11.5 (L) 09/16/2019   HCT 35.8 (L) 09/16/2019    MCV 84.0 09/16/2019   PLT 187 09/16/2019   CMP Latest Ref Rng & Units 03/25/2019  Glucose 65 - 99 mg/dL 74  BUN 6 - 20 mg/dL 8  Creatinine 0.57 - 1.00 mg/dL 0.66  Sodium 134 - 144 mmol/L 139  Potassium 3.5 - 5.2 mmol/L 3.9  Chloride 96 - 106 mmol/L 103  CO2 20 - 29 mmol/L 21  Calcium 8.7 - 10.2 mg/dL 9.5  Total Protein 6.0 - 8.5 g/dL 6.8  Total Bilirubin 0.0 - 1.2 mg/dL 0.2  Alkaline Phos 39 - 117 IU/L 46  AST 0 - 40 IU/L 12  ALT 0 - 32 IU/L 15   Edinburgh Score: Edinburgh Postnatal Depression Scale Screening Tool 09/17/2019  I have been able to laugh and see the funny side of things. 0  I have looked forward with enjoyment to things. 0  I have blamed myself unnecessarily when things went wrong. 0  I have been anxious or worried for no good reason. 1  I have felt scared or panicky for no good reason. 1  Things have been getting on top of me. 1  I have been so unhappy that I have had difficulty sleeping. 0  I have felt sad or miserable. 0  I have been so unhappy that I have been crying. 0  The thought of harming myself has occurred to  me. 0  Edinburgh Postnatal Depression Scale Total 3     After visit meds:  Allergies as of 09/18/2019   No Known Allergies     Medication List    TAKE these medications   acetaminophen 325 MG tablet Commonly known as: Tylenol Take 2 tablets (650 mg total) by mouth every 4 (four) hours as needed (for pain scale < 4).   docusate sodium 100 MG capsule Commonly known as: COLACE Take 1 capsule (100 mg total) by mouth 2 (two) times daily.   ibuprofen 600 MG tablet Commonly known as: ADVIL Take 1 tablet (600 mg total) by mouth every 6 (six) hours.   prenatal multivitamin Tabs tablet Take 1 tablet by mouth daily at 12 noon.        Discharge home in stable condition Infant Feeding: No evidence of DVT seen on physical exam. Infant Disposition:home with mother Discharge instruction: per After Visit Summary and Postpartum  booklet. Activity: Advance as tolerated. Pelvic rest for 6 weeks.  Diet: routine diet Future Appointments: Future Appointments  Date Time Provider [place]  10/16/2019 10:55 AM Gavin Pound, CNM [facility]  11/19/2019  8:00 AM Salvadore Dom, MD [place] None   Follow up Visit:  [place] for [place] at [facility] Follow up in 6 week(s).   Specialty: Obstetrics and Gynecology Contact information: [place] [address] [phone]               Please schedule this patient for a In person postpartum visit in 6 weeks with the following provider: Any provider. Additional Postpartum F/U:none  Low risk pregnancy complicated by: none Delivery mode:  Vaginal, Spontaneous  Anticipated Birth Control:  Vasectomy (condoms to bridge)    09/18/2019 Janet Berlin, MD

## 2019-09-18 MED ORDER — DOCUSATE SODIUM 100 MG PO CAPS: 100.0000 mg | ORAL_CAPSULE | Freq: Two times a day (BID) | ORAL | 0 refills | Status: DC

## 2019-09-18 MED ORDER — ACETAMINOPHEN 325 MG PO TABS: 650.0000 mg | ORAL_TABLET | ORAL | Status: DC | PRN

## 2019-09-18 MED ORDER — IBUPROFEN 600 MG PO TABS
600.0000 mg | ORAL_TABLET | Freq: Four times a day (QID) | ORAL | 0 refills | Status: DC
Start: 2019-09-18 — End: 2019-09-18

## 2019-09-18 MED ORDER — IBUPROFEN 800 MG PO TABS
800.0000 mg | ORAL_TABLET | Freq: Three times a day (TID) | ORAL | 0 refills | Status: DC | PRN
Start: 2019-09-18 — End: 2020-05-05

## 2019-09-18 NOTE — Lactation Note (Signed)
This note was copied from a baby's chart. Lactation Consultation Note Baby 61 hrs old. Mom understands English and understands LC. Baby BF well. 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 Hand expression w/colostrum easily expressed.  Mom BF her 32 yr old for 4 months, her 2nd 2 months, and her 34 yr old for 2 yrs. Gave mom shells to wear in bra to evert nipples. Gave mom hand pump for pre-pumping to evert nipples.  Newborn behavior, STS, I&O, milk storage, breast massage, supply and demand discussed. Mom encouraged to feed baby 8-12 times/24 hours and with feeding cues.  Gave mom Lactation brochures in [place]. Mom stated she didn't have any questions or concerns.  Patient Name: Laura Brady [id number] Date: 09/18/2019 Reason for consult: Initial assessment;Early term 37-38.6wks   Maternal Data Has patient been taught Hand Expression?: Yes Does the patient have breastfeeding experience prior to this delivery?: Yes  Feeding Feeding Type: Breast Fed  LATCH Score Latch: Grasps breast easily, tongue down, lips flanged, rhythmical sucking.  Audible Swallowing: A few with stimulation  Type of Nipple: Inverted  Comfort (Breast/Nipple): Soft / non-tender  Hold (Positioning): Assistance needed to correctly position infant at breast and maintain latch.  LATCH Score: 6  Interventions Interventions: Breast massage;Hand express;Pre-pump if needed;Shells;Hand pump;Adjust position;Support pillows;Skin to skin;Breast compression;Breast feeding basics reviewed  Lactation Tools Discussed/Used Tools: Pump;Shells Shell Type: Inverted Breast pump type: Manual WIC Program: Yes Pump Review: Setup, frequency, and cleaning;Milk Storage Initiated by:: Peri Jefferson RN IBCLC Date initiated:: 09/18/19   Consult Status Consult Status: Follow-up Date: 09/19/19 Follow-up type: In-patient    Charyl Dancer 09/18/2019, 6:54 AM

## 2019-09-18 NOTE — Lactation Note (Signed)
This note was copied from a baby's chart. Lactation Consultation Note Attempted to see mom, mom sleeping.  Patient Name: Laura Brady Date: 09/18/2019     Maternal Data    Feeding    LATCH Score                   Interventions    Lactation Tools Discussed/Used     Consult Status      Laura Brady, [name] 09/18/2019, 3:06 AM

## 2019-09-18 NOTE — Progress Notes (Signed)
AVS printed and discharged instructions given to patient with FOB present. Pt instructed to call for follow-up appointment and to pick up prescriptions. All questions answered and pt verbalized understanding.

## 2019-09-18 NOTE — Lactation Note (Signed)
This note was copied from a baby's chart. Lactation Consultation Note  Patient Name: Laura Brady [id number] Date: 09/18/2019 Reason for consult: Follow-up assessment;Early term 37-38.6wks;Infant weight loss  Visited with mom of a 46 hours old ETI female, she's a P4 and experienced BF. Mom and baby are going home today, baby is at 5% weight loss. Reviewed discharge instructions, engorgement prevention/treatment, treatment/prevention for sore nipples and red flags on when to call baby's pediatrician.  Mom has inverted nipples, but they evert upon compression. So far, she's been able to latch baby to breast without any problems, but advised her to pre-pump prior feedings to help evert her nipples. Baby was asleep on his bassinet when entering the room, per dad baby doesn't like to be touched because he cries.  Encouraged mom to keep feeding on cues STS 8-12 times/24 hours and to call lactation if any questions or concerns arise, parents are Spanish speakers mom's youngest child is 43 y.o. Parents reported all questions and concerns were answered, they're both aware of LC OP services and will call PRN.   Maternal Data    Feeding    LATCH Score                   Interventions Interventions: Breast feeding basics reviewed;Hand pump  Lactation Tools Discussed/Used Tools: Pump Breast pump type: Manual   Consult Status Consult Status: Complete Date: 09/18/19 Follow-up type: Call as needed    Joselyn Edling Venetia Constable 09/18/2019, 1:03 PM

## 2019-09-23 ENCOUNTER — Ambulatory Visit: Payer: Self-pay

## 2019-09-23 ENCOUNTER — Encounter: Payer: Self-pay | Admitting: Certified Nurse Midwife

## 2019-09-23 NOTE — Lactation Note (Signed)
This note was copied from a baby's chart. Lactation Consultation Note  Patient Name: Laura Brady HUDJS'H Date: 09/23/2019  baby boy [name] now 57 days old.  Via interpreter, [name],  mom reports he was feeding really often like every 1-2 hours so she pumped her milk a few times and gave him bottles to see if that would help/.  Mom reports then he would not go back to the breast.  Mom reports he would just get madder and madder until she would have to give in and give a bottle.  Mom would like to get him back to the breast and exclusively breastfeeding.  Baby under double phototherapy at this time and has been taking pacifier.  Showed mom how to let him suck on a gloved finger. Explained he may not breastfeed as well while he is jaundiced so that when his jaundice resolves we can work towards getting him back to the breast again. Mom has been using a manual pump at home.  Does not have a DEBP. Mom is on [facility] in [place].  Urged mom to follow up with [facility] tomorrow and [facility] will send referral as well.   Last time mom pumped she pumped five ounces.  Discused pumping at least 8 times day for 15 minutes.  Disicussed how breasts should soften with pumping.  Discussed possibility of pumping up to 30 minutes.  Reviewed pump settings with mom.  Mom using 24 mm flanges and reports comfort.  Reviewed appropriate flange fit. Praised all of moms breastmilk and breastfeeding efforts.  Urged mom to reach out to lactation as needed.    Maternal Data    Feeding Feeding Type: Breast Fed  [facility] Score                   Interventions    Lactation Tools Discussed/Used     Consult Status      Kaydynce Pat Michaelle Copas 09/23/2019, 8:04 PM

## 2019-09-30 ENCOUNTER — Encounter: Payer: Self-pay | Admitting: Family Medicine

## 2019-10-16 ENCOUNTER — Ambulatory Visit (INDEPENDENT_AMBULATORY_CARE_PROVIDER_SITE_OTHER): Payer: Medicaid Other

## 2019-10-16 ENCOUNTER — Other Ambulatory Visit: Payer: Self-pay

## 2019-10-16 DIAGNOSIS — N3946 Mixed incontinence: Secondary | ICD-10-CM

## 2019-10-16 DIAGNOSIS — O99893 Other specified diseases and conditions complicating puerperium: Secondary | ICD-10-CM | POA: Diagnosis not present

## 2019-10-16 NOTE — Progress Notes (Signed)
Post Partum Visit Note  Laura Brady is a 34 y.o. [phone] female who presents for a postpartum visit. She is 4 weeks postpartum following a SVD.  I have fully reviewed the prenatal and intrapartum course. The delivery was at 37.5 gestational weeks.  Anesthesia: epidural. Postpartum course has been unremarkable. Baby is doing well. Baby is feeding by breast. Bleeding no bleeding. Bowel function is normal. Bladder function is abnormal: leaking before making it to bathroom . Patient is not sexually active. Contraception method is condoms. Postpartum depression screening:negative.  Patient reports she receives support, with infant care, from her husband.  She endorses safety at home and denies DV/A.   The pregnancy intention screening data noted above was reviewed. Potential methods of contraception were discussed. The patient elected to proceed with Female Condom and Vasectomy.    Edinburgh Postnatal Depression Scale - 10/16/19 1057      Edinburgh Postnatal Depression Scale:  In the Past 7 Days   I have been able to laugh and see the funny side of things. 0    I have looked forward with enjoyment to things. 0    I have blamed myself unnecessarily when things went wrong. 0    I have been anxious or worried for no good reason. 0    I have felt scared or panicky for no good reason. 0    Things have been getting on top of me. 0    I have been so unhappy that I have had difficulty sleeping. 0    I have felt sad or miserable. 0    I have been so unhappy that I have been crying. 0    The thought of harming myself has occurred to me. 0    Edinburgh Postnatal Depression Scale Total 0            The following portions of the patient's history were reviewed and updated as appropriate: allergies, current medications, past family history, past medical history, past social history, past surgical history and problem list.  Review of Systems Pertinent items noted in HPI and remainder of  comprehensive ROS otherwise negative.    Objective:  Blood pressure 118/75, pulse (!) 101, weight 195 lb 8 oz (88.7 kg), last menstrual period 12/27/2018, unknown if currently breastfeeding.  General:  alert, cooperative and no distress   Breasts:  Not examined  Lungs: clear to auscultation bilaterally  Heart:  regular rate and rhythm  Abdomen: normal findings: soft, non-tender   Vulva:  not evaluated  Vagina: not evaluated  Cervix:  Not Examined  Corpus: not examined  Adnexa:  not evaluated  Rectal Exam: Not performed.        Assessment:    4 week postpartum exam.  Normal Involution Breastfeeding Vasectomy with Condoms during Interim Pap smear 03/25/2019  Plan:   1. Postpartum state -Okay to return to normal activities   2. Mixed stress and urge urinary incontinence -Patient states symptoms are improving. -Informed that normal to have some incontinence with vaginal delivery. -Instructed to monitor and report lack of or worsening symptoms for PT referral.  Essential components of care per ACOG recommendations:  1.  Mood and well being: Patient with negative depression screening today. Reviewed local resources for support.  - Patient does not use tobacco.  - hx of drug use? No    2. Infant care and feeding:  -Patient currently breastmilk feeding? Yes -She states she was extracting and was latching infant, but he lost interest.  Now she is pumping and putting it in a bottle. She states she is working with lactation regarding infant latch. She is working with lactation at infant pediatrician.  -Social determinants of health (SDOH) reviewed in EPIC. No concerns  3. Sexuality, contraception and birth spacing - Patient does not want a pregnancy in the next year.  Desired family size is 5 children.  - Reviewed forms of contraception in tiered fashion. Patient desired condoms, vasectomy today.   - Discussed birth spacing of 18 months  4. Sleep and fatigue -Encouraged  family/partner/community support of 4 hrs of uninterrupted sleep to help with mood and fatigue  5. Physical Recovery  - Discussed patients delivery and complications-"Bien, rapido!" - Patient had a right labial laceration, perineal healing reviewed. Patient expressed understanding - Patient has urinary incontinence? Yes Reports that she felt she was "peeing myself."  She states it occurs mostly when she sneezes, but also occurs with full bladder. Patient reports it has improved.  Patient referral to pelvic floor PT deferred currently.  - Patient is safe to resume physical and sexual activity  6.  Health Maintenance - Last pap smear done March 2021 and was ASCUS with negative HPV. Mammogram  7. Chronic Disease - PCP follow up  Cherre Robins, CNM [facility] [employer], [facility]

## 2019-11-18 ENCOUNTER — Encounter: Payer: Self-pay | Admitting: Obstetrics & Gynecology

## 2019-11-18 IMAGING — US OBSTETRIC <14 WK US AND TRANSVAGINAL OB US
1 series · 15 of 28 positions shown · non-contrast
Comparison: 08/02/2018

CLINICAL DATA: Vaginal bleeding[place] [address].
Patient is 10 weeks 2 days by LMP. EDC by LMP is 02/28/2019.

EXAM:
OBSTETRIC <14 WK US AND TRANSVAGINAL OB US
TECHNIQUE: Both transabdominal and transvaginal ultrasound examinations were
performed for complete evaluation of the gestation as well as the
maternal uterus, adnexal regions, and pelvic cul-de-sac.
Transvaginal technique was performed to assess early pregnancy.

[Series 1: obstetric <14 wk us and transvaginal ob us · 66 acquisitions, 15 frames shown]
[im 1/66]
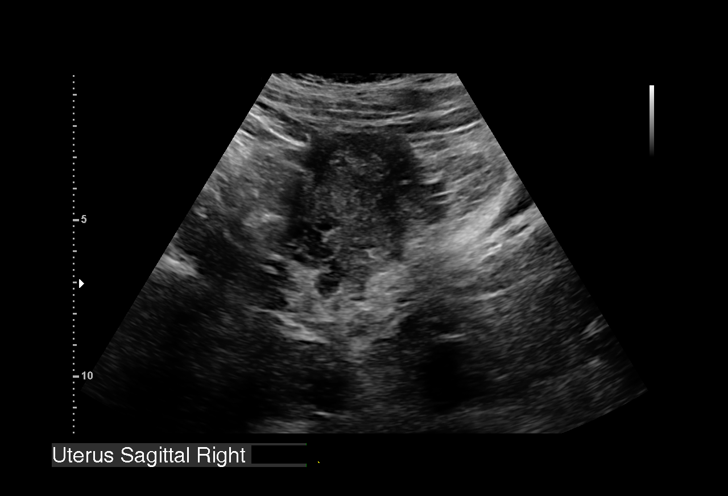
[im 5/66]
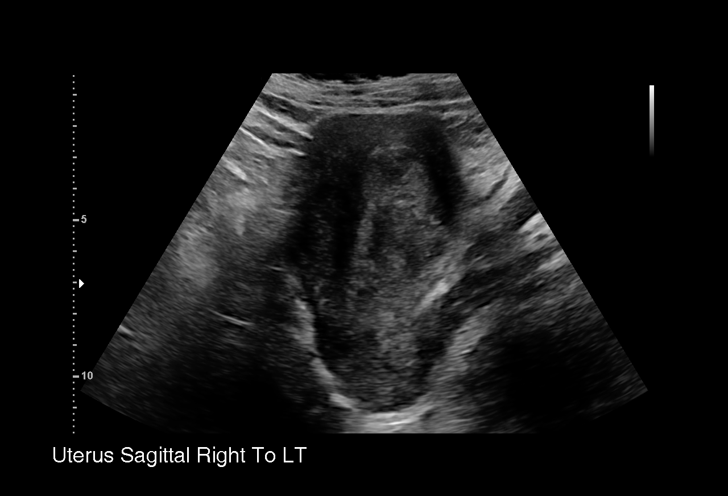
[im 10/66]
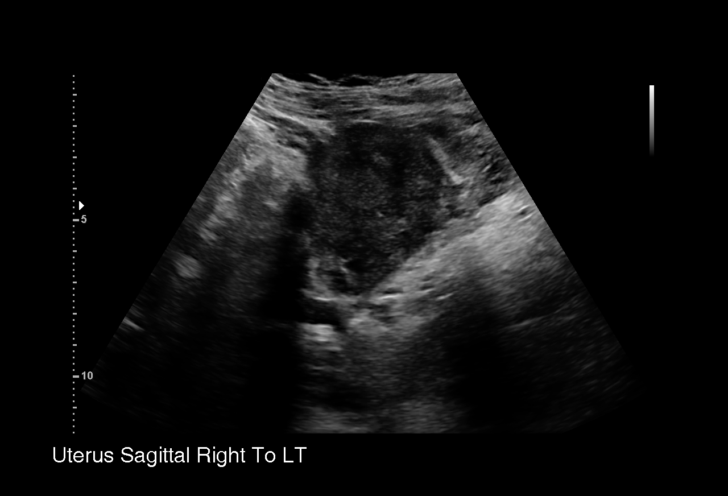
[im 15/66]
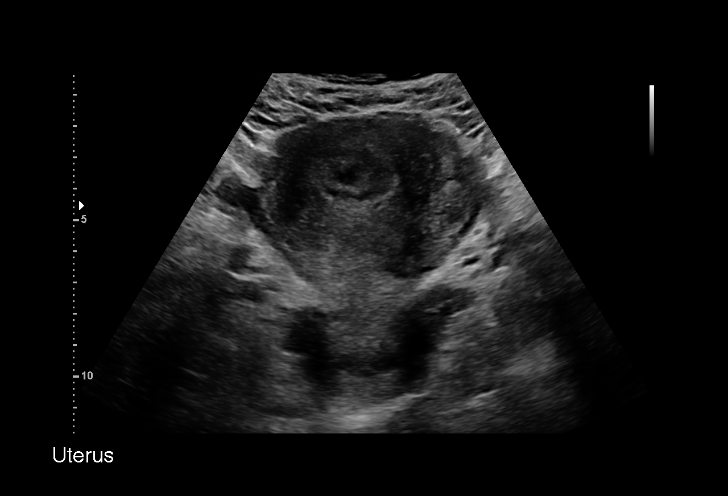
[im 20/66]
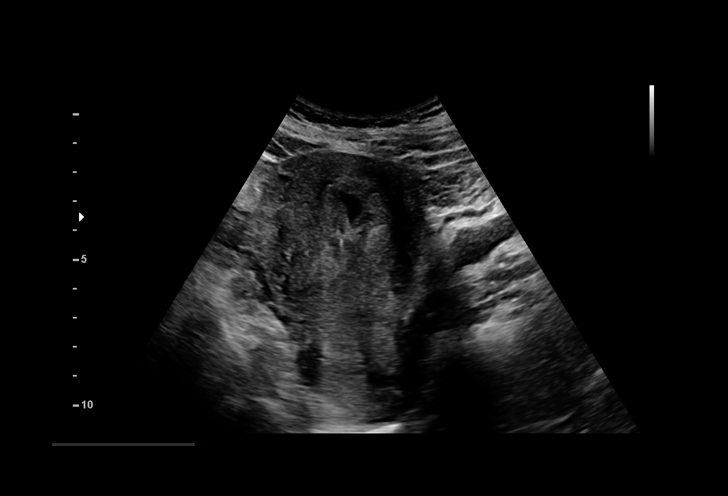
[im 25/66]
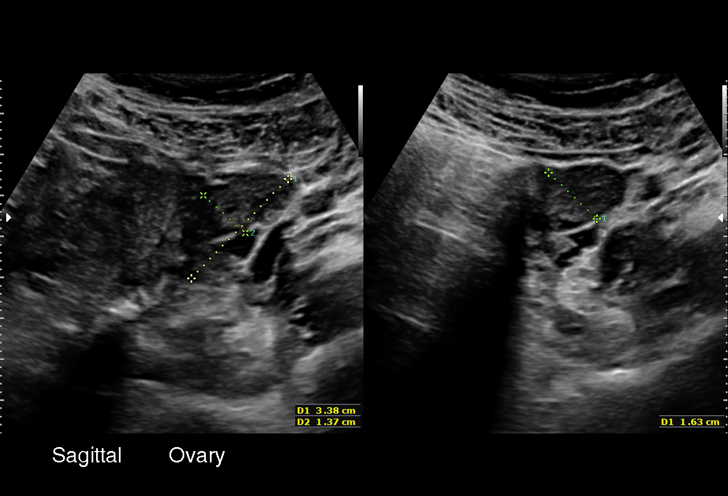
[im 29/66]
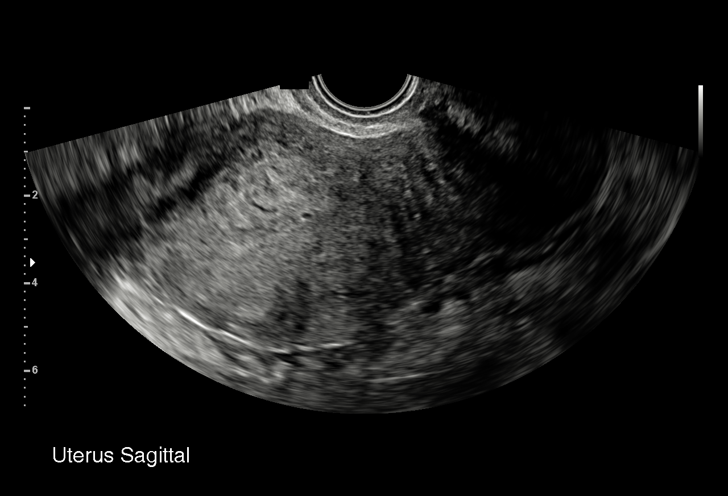
[im 34/66]
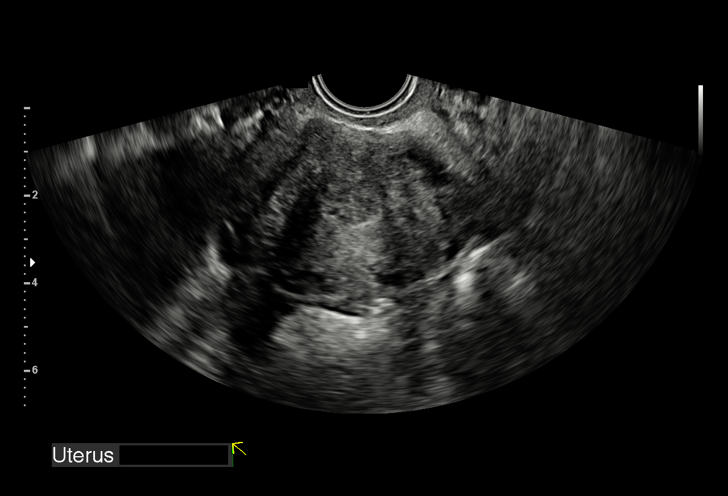
[im 37/66]
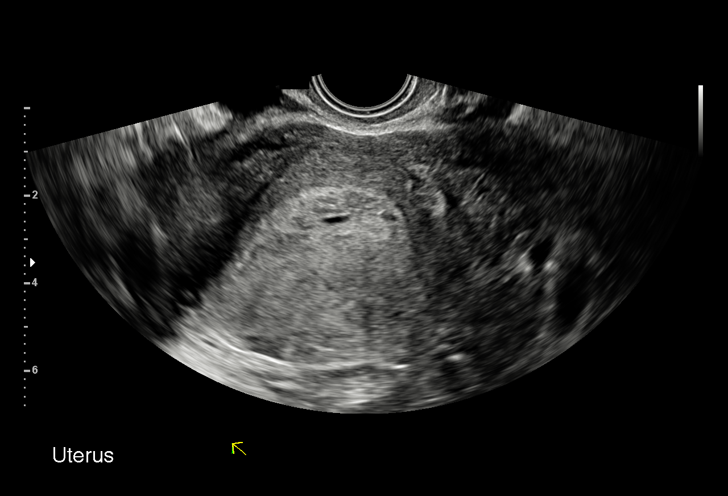
[im 41/66]
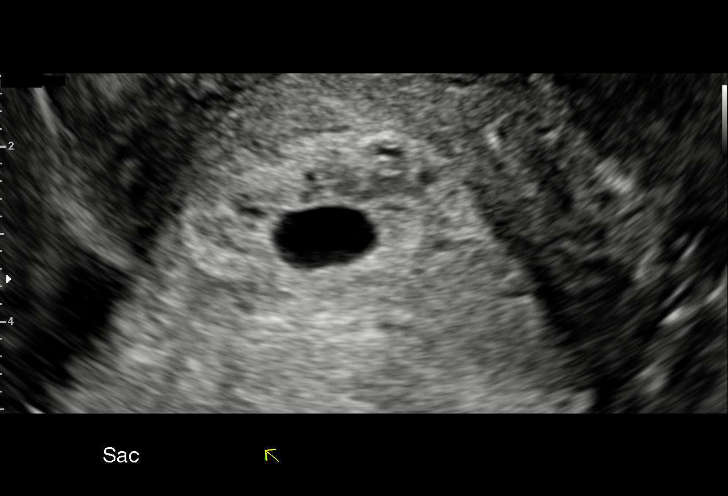
[im 46/66]
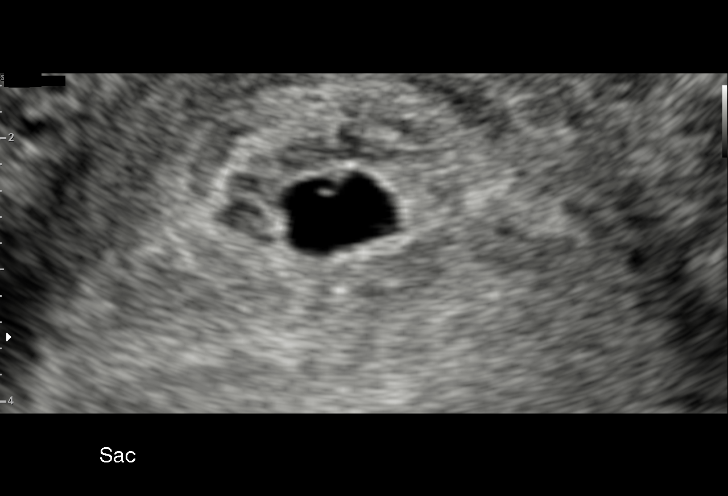
[im 51/66]
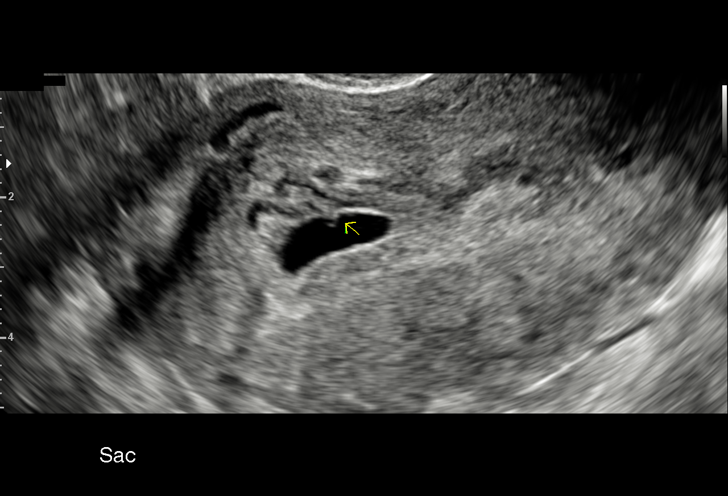
[im 56/66]
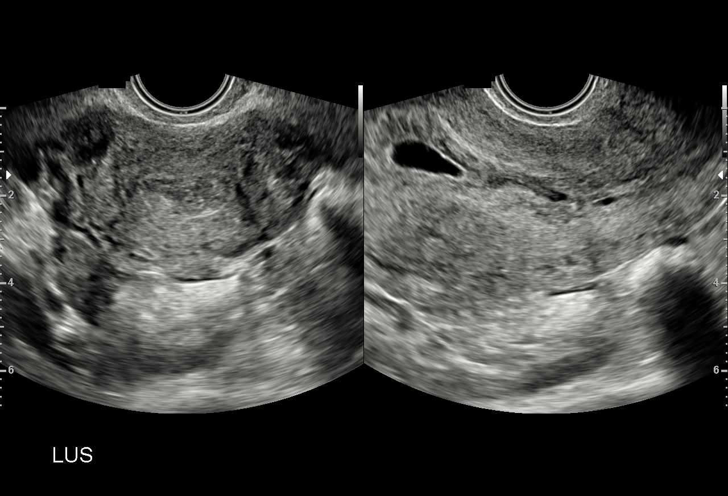
[im 61/66]
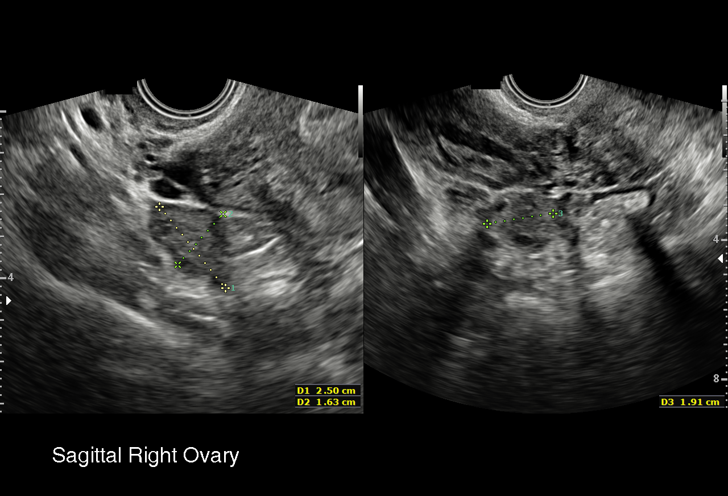
[im 66/66]
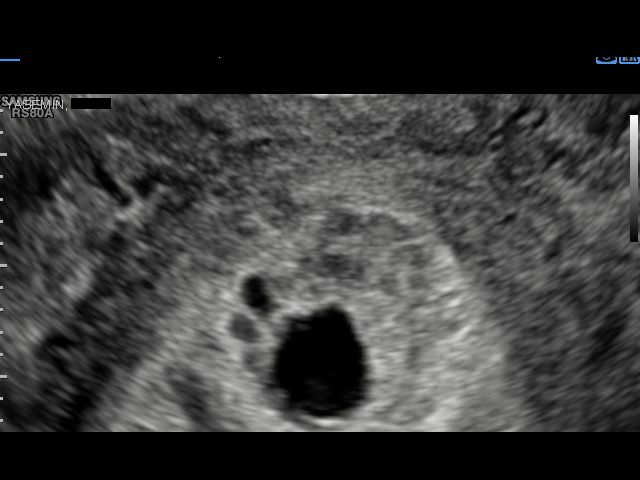

[15 of 28 positions shown; findings below may reference images not displayed]

FINDINGS: Intrauterine gestational sac: Single

Yolk sac:  Visualized.

Embryo:  Small echogenic focus possibly the embryo.

Cardiac Activity: absent

Heart Rate: Absent bpm

MSD: 9.29 mm   5 w   5 d

CRL:  1.2 mm   not defined                   US EDC: Not applicable

Subchorionic hemorrhage:  None visualized.

Maternal uterus/adnexae: Normal appearance of both ovaries. No free
pelvic fluid.
IMPRESSION: Persistent gestational sac and yolk sac.

Possible development of small embryo, measuring 1.2 millimeters but
too small to provided dating. No cardiac activity.

Recommend follow-up OB ultrasound in 9 days or later for assessment
of viability.

The falling quantitative beta HCG values are concerning.

## 2019-11-19 ENCOUNTER — Ambulatory Visit: Payer: Self-pay | Admitting: Obstetrics and Gynecology

## 2019-11-26 IMAGING — US TRANSVAGINAL OB ULTRASOUND
1 series · 15 of 28 positions shown · non-contrast
Comparison: 08/04/2018

CLINICAL DATA: Abdominal pain and vaginal bleeding in 1st trimester
pregnancy. Threatened miscarriage. First trimester pregnancy with
inconclusive fetal viability.

EXAM:
TRANSVAGINAL OB ULTRASOUND
TECHNIQUE: Transvaginal ultrasound was performed for complete evaluation of the
gestation as well as the maternal uterus, adnexal regions, and
pelvic cul-de-sac.

[Series 1: transvaginal ob ultrasound · 15 of 46 slices shown]
[im 1/46]
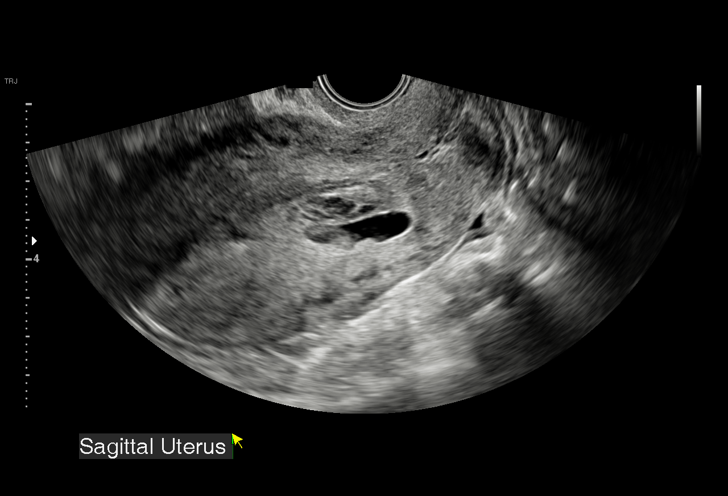
[im 4/46]
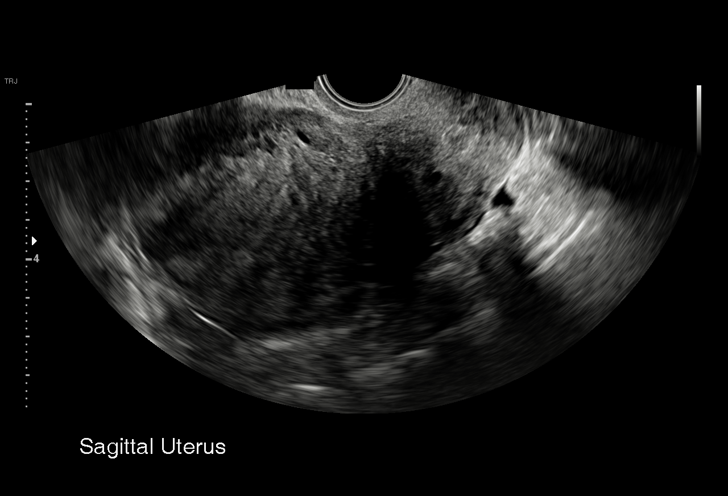
[im 7/46]
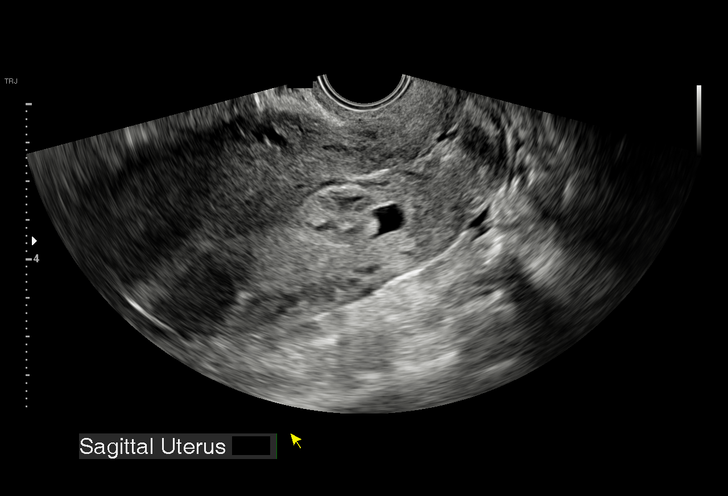
[im 11/46]
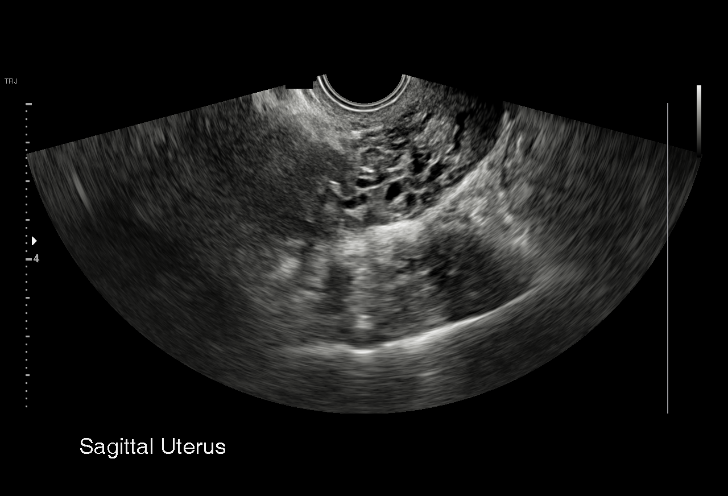
[im 14/46]
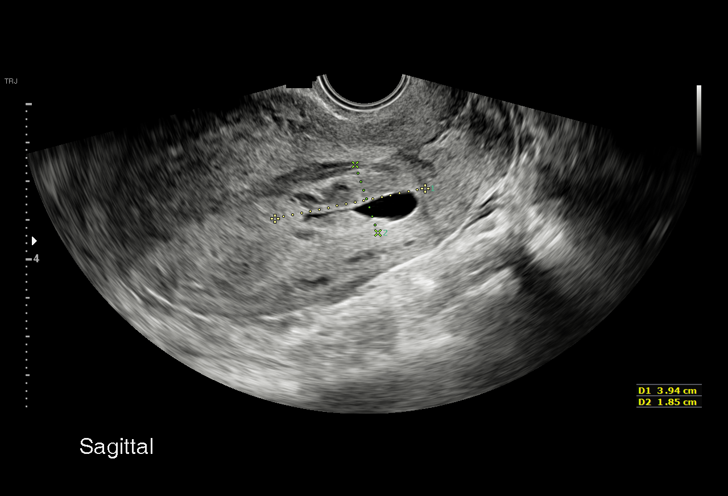
[im 17/46]
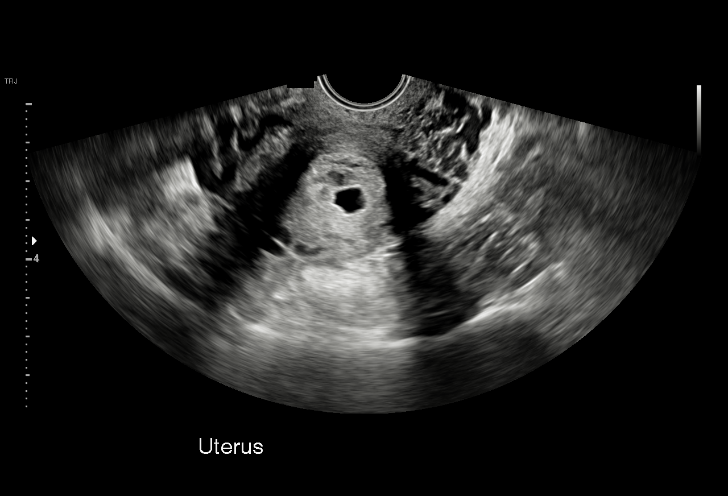
[im 21/46]
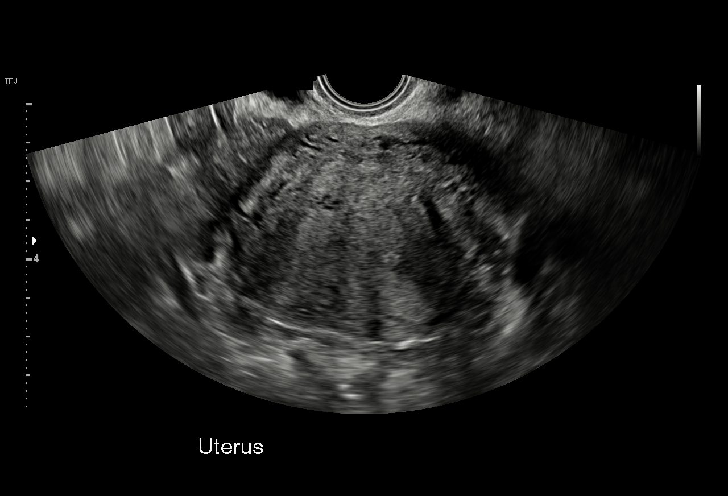
[im 24/46]
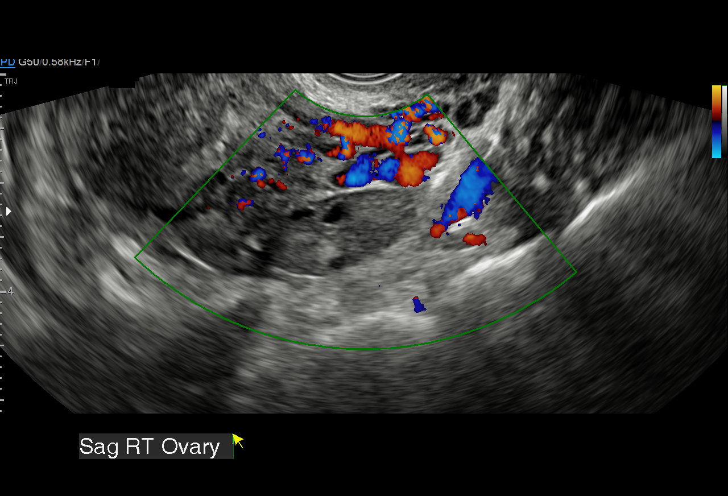
[im 26/46]
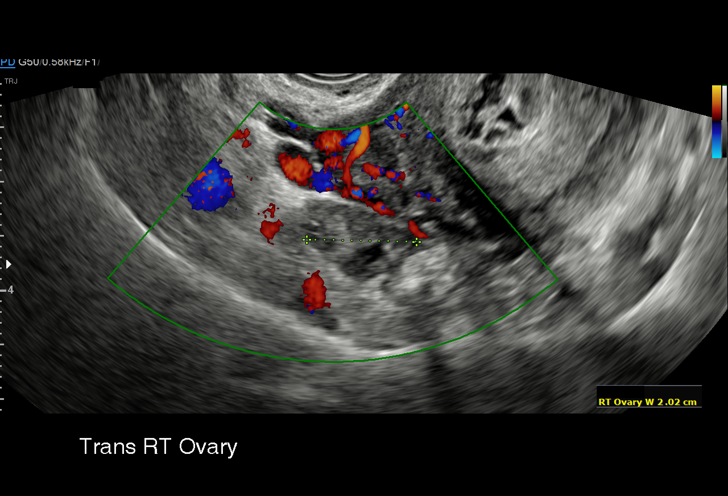
[im 29/46]
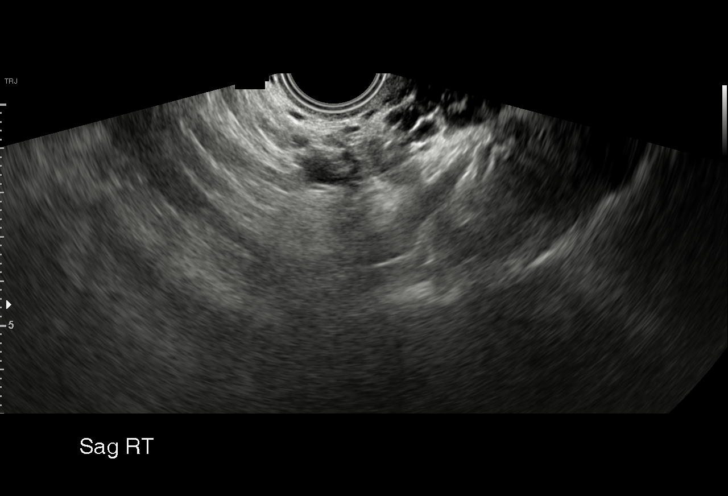
[im 32/46]
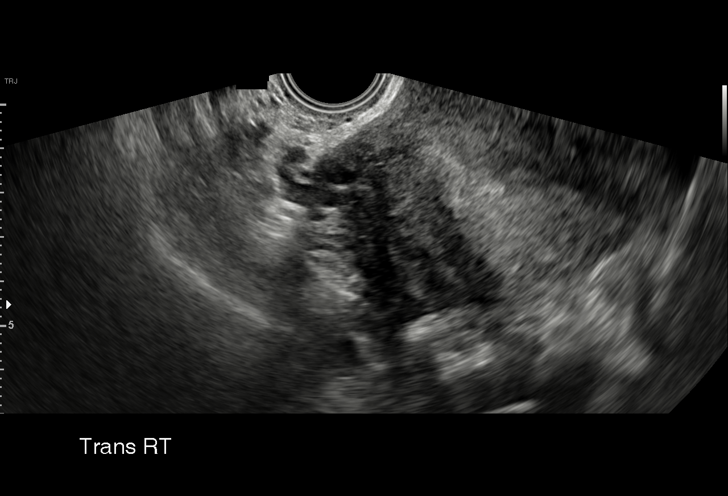
[im 36/46]
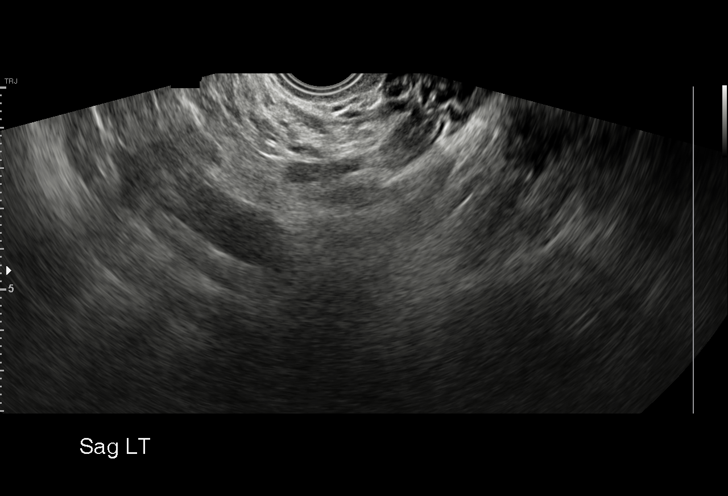
[im 39/46]
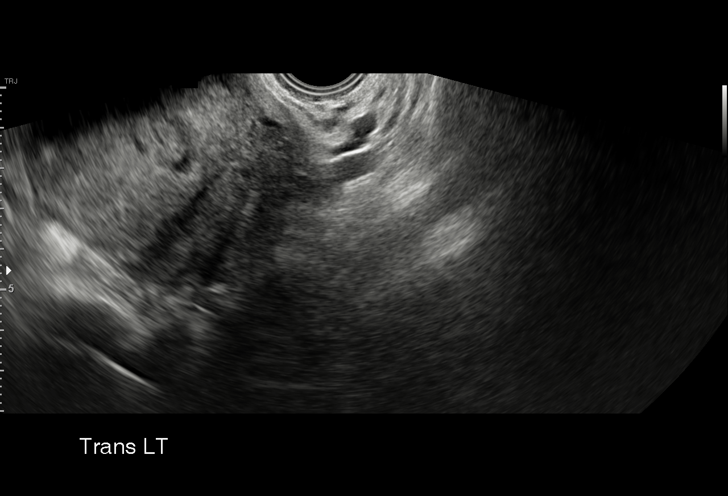
[im 42/46]
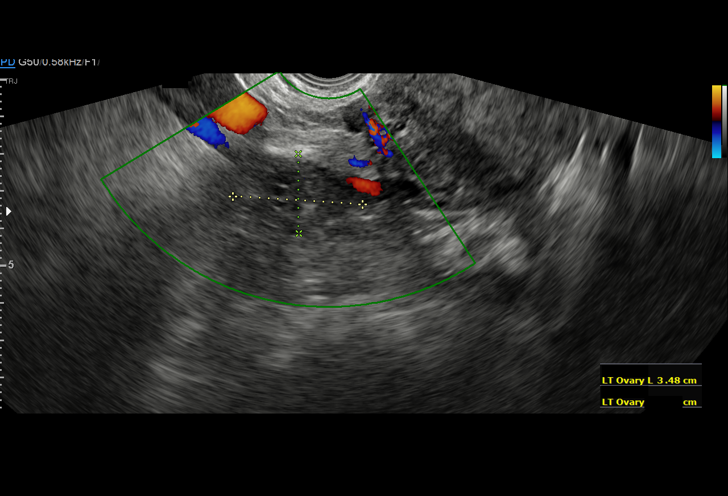
[im 46/46]
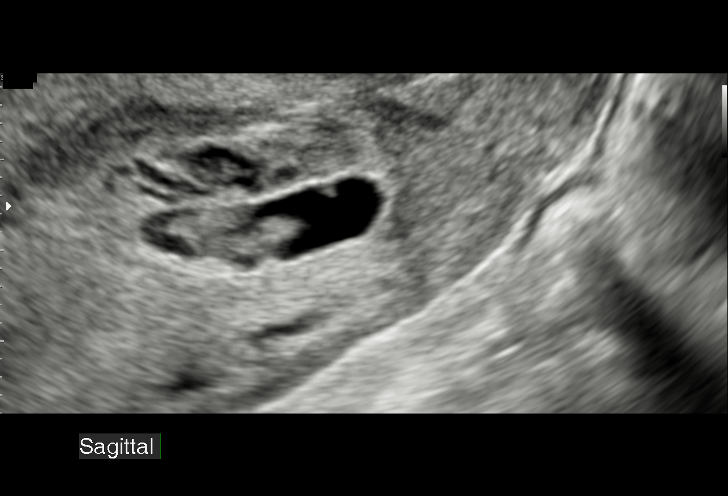

[15 of 28 positions shown; findings below may reference images not displayed]

FINDINGS: Intrauterine gestational sac: Single; with irregular shape now
located in lower uterine segment

Yolk sac:  Not Visualized.

Embryo:  Not Visualized.

MSD: 15 mm   6 w   2 d

Subchorionic hemorrhage: Small to moderate subchorionic hemorrhage
noted

Maternal uterus/adnexae: Both ovaries are normal in appearance. No
mass or abnormal free fluid identified.
IMPRESSION: Single intrauterine gestational sac with irregular shape is now
located in the lower uterine segment, suggesting SAB in progress.
Subchorionic hemorrhage also noted.

No adnexal mass or abnormal free fluid identified.

## 2020-05-03 NOTE — Progress Notes (Signed)
35 y.o. [id number] Single Other or two or more races Hispanic or Latino female here for annual exam. Had a baby 7 months again. She is nursing and not having cycles.    Infrequently sexually active, using condoms.   No bowel c/o.   She has some GSI when her bladder is full. Small amounts.     Patient's last menstrual period was 11/29/2018 (approximate).          Sexually active: Yes.    The current method of family planning is condoms most of the time.    Exercising: No.  The patient does not participate in regular exercise at present. Smoker:  no  Health Maintenance: Pap:  03/25/19 ASCUS Hr Hpv Neg.06/23/2016 negative, 12/18/2014 negative History of abnormal Pap:  No MMG:  10/24/17 right breast US Birads 2 benign  BMD:   NA Colonoscopy: none  TDaP:  07/17/19  Gardasil: unsure    reports that she has never smoked. She has never used smokeless tobacco. She reports previous alcohol use. She reports that she does not use drugs. 1 daughter, 2 years old. 3 sons, 9, 6 and 7 months.   Past Medical History:  Diagnosis Date  . COVID-19 11/2018  . History of prediabetes   . Medical history non-contributory     Past Surgical History:  Procedure Laterality Date  . NO PAST SURGERIES      Current Outpatient Medications  Medication Sig Dispense Refill  . acetaminophen (TYLENOL) 325 MG tablet Take 2 tablets (650 mg total) by mouth every 4 (four) hours as needed (for pain scale < 4).    . Prenatal Vit-Fe Fumarate-FA (PRENATAL MULTIVITAMIN) TABS tablet Take 1 tablet by mouth daily at 12 noon.     No current facility-administered medications for this visit.    Family History  Problem Relation Age of Onset  . Diabetes Mother   . Hyperlipidemia Mother   . Hypertension Mother   . Brain cancer Mother   . Uterine cancer Mother   . Stroke Father   . Diabetes Father   . Diabetes Brother     Review of Systems  All other systems reviewed and are negative.   Exam:   BP 100/74   Pulse 63    Ht 5\' 5"  (1.651 m)   Wt 198 lb (89.8 kg)   LMP 11/29/2018 (Approximate)   SpO2 98%   Breastfeeding Yes   BMI 32.95 kg/m   Weight change: @WEIGHTCHANGE @ Height:   Height: 5\' 5"  (165.1 cm)  Ht Readings from Last 3 Encounters:  05/05/20 5\' 5"  (1.651 m)  02/11/19 5\' 5"  (1.651 m)  11/13/18 5\' 4"  (1.626 m)    General appearance: alert, cooperative and appears stated age Head: Normocephalic, without obvious abnormality, atraumatic Neck: no adenopathy, supple, symmetrical, trachea midline and thyroid normal to inspection and palpation Lungs: clear to auscultation bilaterally Cardiovascular: regular rate and rhythm Breasts: normal appearance, no masses or tenderness Abdomen: soft, non-tender; non distended,  no masses,  no organomegaly Extremities: extremities normal, atraumatic, no cyanosis or edema Skin: Skin color, texture, turgor normal. No rashes or lesions Lymph nodes: Cervical, supraclavicular, and axillary nodes normal. No abnormal inguinal nodes palpated Neurologic: Grossly normal   Pelvic: External genitalia:  no lesions              Urethra:  normal appearing urethra with no masses, tenderness or lesions              Bartholins and Skenes: normal  Vagina: mildly atrophic appearing vagina with normal color and discharge, no lesions. With valsalva a small grade 1-2 rectocele is noted. No cystocele. Only examined supine              Cervix: no lesions               Bimanual Exam:  Uterus:  normal size, contour, position, consistency, mobility, non-tender              Adnexa: no mass, fullness, tenderness               Rectovaginal: Confirms               Anus:  normal sphincter tone, no lesions  Carolynn Serve chaperoned for the exam.  1. Well woman exam Discussed breast self exam Discussed calcium and vit D intake No pap this year She is using condoms for contraception, considering vasectomy  2. History of prediabetes - Hemoglobin A1c  3. BMI  32.0-32.9,adult - Hemoglobin A1c - Lipid panel  4. Vitamin D deficiency - VITAMIN D 25 Hydroxy (Vit-D Deficiency, Fractures)  5. GSI (genuine stress incontinence), female Kegel information given

## 2020-05-05 ENCOUNTER — Other Ambulatory Visit: Payer: Self-pay

## 2020-05-05 ENCOUNTER — Encounter: Payer: Self-pay | Admitting: Obstetrics and Gynecology

## 2020-05-05 ENCOUNTER — Ambulatory Visit (INDEPENDENT_AMBULATORY_CARE_PROVIDER_SITE_OTHER): Payer: Self-pay | Admitting: Obstetrics and Gynecology

## 2020-05-05 VITALS — BP 100/74 | HR 63 | Ht 65.0 in | Wt 198.0 lb

## 2020-05-05 DIAGNOSIS — E559 Vitamin D deficiency, unspecified: Secondary | ICD-10-CM

## 2020-05-05 DIAGNOSIS — Z6832 Body mass index (BMI) 32.0-32.9, adult: Secondary | ICD-10-CM

## 2020-05-05 DIAGNOSIS — Z87898 Personal history of other specified conditions: Secondary | ICD-10-CM | POA: Insufficient documentation

## 2020-05-05 DIAGNOSIS — N393 Stress incontinence (female) (male): Secondary | ICD-10-CM

## 2020-05-05 DIAGNOSIS — Z01419 Encounter for gynecological examination (general) (routine) without abnormal findings: Secondary | ICD-10-CM

## 2020-05-05 NOTE — Patient Instructions (Signed)
Ejercicios de Scientist, water quality  Los ejercicios de Kegel pueden ayudar a fortalecer los msculos del suelo plvico. El suelo plvico est formado por un grupo de msculos que sostienen el recto, el intestino delgado y la vejiga. En las mujeres, los msculos del suelo plvico tambin sostienen el tero. Estos msculos ayudan a controlar el flujo de [place] y materia fecal (heces). Los ejercicios de Kegel son indoloros y simples, y no requieren ningn equipo. El mdico puede sugerir ejercicios de Kegel para:  Mejorar el control de la vejiga y los intestinos.  Mejorar la respuesta sexual.  Mejorar los msculos dbiles del suelo plvico despus de una ciruga para extirpar el tero (histerectoma) o de un embarazo (mujeres).  Mejorar los msculos del suelo plvico dbiles despus de la extirpacin o ciruga de la prstata (hombres). Los ejercicios de Kegel consisten en contraer los msculos del suelo plvico, que son los mismos que contrae al intentar detener el flujo de la orina o evitar eliminar gases. Estos ejercicios se pueden Ecologist est sentado, parado o [place], pero lo mejor es variar la posicin. Ejercicios Cmo hacer los ejercicios de Kegel: 1. Contraiga con fuerza los msculos del suelo plvico. Debe sentir que el rea rectal se eleva y se tensa. Si es [place], tambin debe sentir tensin en el rea vaginal. Mantenga el [place], las nalgas y las piernas [place]. 2. Mantenga los msculos tensos durante 10 segundos como mximo. 3. Respire con normalidad. 4. Relaje los msculos. 5. Repita como se lo haya indicado el mdico. Repita este ejercicio a diario como se lo haya indicado el mdico. Contine haciendo este ejercicio durante al menos 4 a 6 semanas o el tiempo que le haya indicado su mdico. Pueden derivarlo a un fisioterapeuta que lo puede ayudar a aprender ms sobre cmo hacer los ejercicios de Kegel. Segn sea su estado, el mdico puede recomendarle lo  siguiente:  Variar cunto [employer].  Hacer varias series de ejercicios [employer].  Hacer ejercicios durante varias semanas.  Convertir los ejercicios de Kegel en parte de su rutina de ejercicios habitual. Esta informacin no tiene Theme park manager el consejo del mdico. Asegrese de hacerle al mdico cualquier pregunta que tenga. Document Revised: 05/02/2019 Document Reviewed: 09/26/2017 Elsevier Patient Education  2021 [employer].

## 2020-05-06 LAB — VITAMIN D 25 HYDROXY (VIT D DEFICIENCY, FRACTURES): Vit D, 25-Hydroxy: 24 ng/mL — ABNORMAL LOW (ref 30–100)

## 2020-05-06 LAB — LIPID PANEL
Cholesterol: 192 mg/dL (ref <200)
HDL: 46 mg/dL — ABNORMAL LOW (ref >=50)
LDL Cholesterol (Calc): 122 mg/dL (calc) — ABNORMAL HIGH
Non-HDL Cholesterol (Calc): 146 mg/dL (calc) — ABNORMAL HIGH (ref <130)
Total CHOL/HDL Ratio: 4.2 (calc) (ref <5.0)
Triglycerides: 126 mg/dL (ref <150)

## 2020-05-06 LAB — HEMOGLOBIN A1C
Hgb A1c MFr Bld: 5.9 % of total Hgb — ABNORMAL HIGH (ref <5.7)
Mean Plasma Glucose: 123 mg/dL
eAG (mmol/L): 6.8 mmol/L

## 2021-05-10 NOTE — Progress Notes (Signed)
36 y.o. [id number] Single Other or two or more races Hispanic or Latino female here for annual exam.  Sexually active, using condoms.  ?Period Cycle (Days): 28 ?Period Duration (Days): 7 ?Period Pattern: Regular ?Menstrual Flow: Moderate ?Menstrual Control: Maxi pad ?Menstrual Control Change Freq (Hours): 3-4 ?Dysmenorrhea: None ? ?She doesn't want the IUD or the pill. Interested in vasectomy or sterilization.  ? ?Patient's last menstrual period was 05/07/2021.          ?Sexually active: Yes.    ?The current method of family planning is none.    ?Exercising: No.  The patient does not participate in regular exercise at present. ?Smoker:  no ? ?Health Maintenance: ?Pap:  03/25/19 ASCUS Hr Hpv Neg. 06/23/2016 negative, 12/18/2014 negative ?History of abnormal Pap:  no ?MMG:  10/24/17 right breast US Birads 2 benign  ?BMD:   NA ?Colonoscopy: none  ?TDaP:  07/17/19  ?Gardasil: none  ? ? reports that she has never smoked. She has never used smokeless tobacco. She reports that she does not currently use alcohol. She reports that she does not use drugs. Homemaker. Daughter is 70, sons are 82, 8 and 18 months. ? ?Past Medical History:  ?Diagnosis Date  ? COVID-19 11/2018  ? History of prediabetes   ? Medical history non-contributory   ? ? ?Past Surgical History:  ?Procedure Laterality Date  ? NO PAST SURGERIES    ? ? ?Current Outpatient Medications  ?Medication Sig Dispense Refill  ? Prenatal Vit-Fe Fumarate-FA (PRENATAL MULTIVITAMIN) TABS tablet Take 1 tablet by mouth daily at 12 noon.    ? ?No current facility-administered medications for this visit.  ? ? ?Family History  ?Problem Relation Age of Onset  ? Diabetes Mother   ? Hyperlipidemia Mother   ? Hypertension Mother   ? Brain cancer Mother   ? Uterine cancer Mother   ? Stroke Father   ? Diabetes Father   ? Diabetes Brother   ? ? ?Review of Systems  ?All other systems reviewed and are negative. ? ?Exam:   ?BP 110/62   Pulse 72   Ht 5\' 1"  (1.549 m)   Wt 192 lb (87.1 kg)    LMP 05/07/2021   SpO2 99%   BMI 36.28 kg/m?   Weight change: @WEIGHTCHANGE @ Height:   Height: 5\' 1"  (154.9 cm)  ?Ht Readings from Last 3 Encounters:  ?05/12/21 5\' 1"  (1.549 m)  ?05/05/20 5\' 5"  (1.651 m)  ?02/11/19 5\' 5"  (1.651 m)  ? ? ?General appearance: alert, cooperative and appears stated age ?Head: Normocephalic, without obvious abnormality, atraumatic ?Neck: no adenopathy, supple, symmetrical, trachea midline and thyroid normal to inspection and palpation ?Lungs: clear to auscultation bilaterally ?Cardiovascular: regular rate and rhythm ?Breasts: normal appearance, no masses or tenderness ?Abdomen: soft, non-tender; non distended,  no masses,  no organomegaly, +umbilical hernia ?Extremities: extremities normal, atraumatic, no cyanosis or edema ?Skin: Skin color, texture, turgor normal. No rashes or lesions ?Lymph nodes: Cervical, supraclavicular, and axillary nodes normal. ?No abnormal inguinal nodes palpated ?Neurologic: Grossly normal ? ? ?Pelvic: External genitalia:  no lesions ?             Urethra:  normal appearing urethra with no masses, tenderness or lesions ?             Bartholins and Skenes: normal    ?             Vagina: normal appearing vagina with normal color and discharge, no lesions ?  Cervix: no lesions ?              ?Bimanual Exam:  Uterus:  normal size, contour, position, consistency, mobility, non-tender ?             Adnexa: no mass, fullness, tenderness ?              Rectovaginal: Confirms ?              Anus:  normal sphincter tone, no lesions ? ?Carolynn Serve chaperoned for the exam. ? ?1. Well woman exam ?Pap is due next year ? ?2. History of prediabetes ?- HgB A1c ? ?3. Vitamin D deficiency ?- VITAMIN D 25 Hydroxy (Vit-D Deficiency, Fractures) ? ?4. BMI 36.0-36.9,adult ? ?5. Elevated LDL cholesterol level ?- Lipid panel ? ?6. History of anemia ?- CBC ? ?7. Laboratory exam ordered as part of routine general medical examination ?- CBC ?- Lipid panel ? ?8. General  counseling and advice on female contraception ?She is interested in sterilization. Discussed vasectomy and laparoscopic tubal ligation vs bilateral salpingectomies.  ? ?9. Umbilical hernia without obstruction and without gangrene ?Not symptomatic ? ? ? ? ?

## 2021-05-12 ENCOUNTER — Encounter: Payer: Self-pay | Admitting: Obstetrics and Gynecology

## 2021-05-12 ENCOUNTER — Ambulatory Visit (INDEPENDENT_AMBULATORY_CARE_PROVIDER_SITE_OTHER): Payer: Self-pay | Admitting: Obstetrics and Gynecology

## 2021-05-12 VITALS — BP 110/62 | HR 72 | Ht 61.0 in | Wt 192.0 lb

## 2021-05-12 DIAGNOSIS — Z3009 Encounter for other general counseling and advice on contraception: Secondary | ICD-10-CM

## 2021-05-12 DIAGNOSIS — Z0000 Encounter for general adult medical examination without abnormal findings: Secondary | ICD-10-CM

## 2021-05-12 DIAGNOSIS — E559 Vitamin D deficiency, unspecified: Secondary | ICD-10-CM

## 2021-05-12 DIAGNOSIS — Z6836 Body mass index (BMI) 36.0-36.9, adult: Secondary | ICD-10-CM

## 2021-05-12 DIAGNOSIS — K429 Umbilical hernia without obstruction or gangrene: Secondary | ICD-10-CM

## 2021-05-12 DIAGNOSIS — E7800 Pure hypercholesterolemia, unspecified: Secondary | ICD-10-CM

## 2021-05-12 DIAGNOSIS — Z87898 Personal history of other specified conditions: Secondary | ICD-10-CM

## 2021-05-12 DIAGNOSIS — Z862 Personal history of diseases of the blood and blood-forming organs and certain disorders involving the immune mechanism: Secondary | ICD-10-CM

## 2021-05-12 DIAGNOSIS — Z01419 Encounter for gynecological examination (general) (routine) without abnormal findings: Secondary | ICD-10-CM

## 2021-05-12 NOTE — Patient Instructions (Signed)
Ligadura de trompas por laparoscopia ?Laparoscopic Tubal Ligation ?La ligadura de trompas por laparoscopia es un procedimiento para cerrar las trompas de [place]. Esto se hace para evitar un embarazo. Cuando las trompas de [employer] se Music therapist, los ?vulos que [employer] ovarios no pueden ingresar al ?Psychiatrist, y los espermatozoides no pueden llegar a los ?vulos liberados. ?No debe realizarse este procedimiento si quiere quedar embarazada alg?n d?a o si no est? segura de si desea tener m?s hijos. ?Informe al m?dico acerca de lo siguiente: ?Cualquier alergia que tenga. ?Todos los [employer] [place], incluidos vitaminas, hierbas, gotas oft?lmicas, cremas y [address] de [address]. ?Problemas previos que usted o alg?n miembro de su familia hayan tenido con los anest?sicos. ?Cualquier trastorno de [employer]. ?Cirug?as a las que se haya sometido. ?Cualquier afecci?n m?dica que tenga. ?Si est? embarazada o podr?a estarlo. ?Cualquier embarazo anterior. ??Cu?les son los riesgos? ?En general, se trata de un procedimiento seguro. Sin embargo, pueden ocurrir complicaciones, por ejemplo: ?Infecci?n. ?Sangrado. ?Lesi?n en otros ?rganos del abdomen. ?Efectos secundarios de los medicamentos anest?sicos. ?Rayna Sexton del procedimiento. ?Este procedimiento puede aumentar su riesgo de Psychiatrist ect?pico. Se trata de un embarazo en el que el ?vulo fertilizado se fija en la parte de afuera del ?tero. ??Qu? ocurre antes del procedimiento? ?Mantenerse hidratada ?Siga las instrucciones del m?dico acerca de mantenerse hidratada, las cuales pueden incluir lo siguiente: ?[address] horas antes del procedimiento, puede beber l?quidos transparentes, como agua, jugos de fruta sin pulpa, caf? negro y t? solo. ?Restricciones en las comidas y bebidas ?Siga las instrucciones del m?dico respecto de las restricciones de comidas o bebidas, las cuales pueden incluir lo siguiente: ?Ocho horas antes del procedimiento, deje de ingerir comidas o  alimentos pesados, como carne, alimentos fritos o alimentos grasos. ?Seis horas antes del procedimiento, deje de ingerir comidas o alimentos livianos, como tostadas o cereales. ?Seis horas antes del procedimiento, deje de beber [place] o bebidas que [employer]. ?Dos horas antes del procedimiento, deje de beber l?quidos transparentes. ?Medicamentos ?Consulte al m?dico si debe hacer o no lo siguiente: ?Cambiar o suspender los medicamentos que [place] habitualmente. Esto es muy importante si toma medicamentos para la diabetes o anticoagulantes. ?Tomar medicamentos como aspirina e ibuprofeno. Estos medicamentos pueden tener un efecto anticoagulante en la [place]. No tome estos medicamentos a menos que el m?dico se lo indique. ?Usar medicamentos de venta libre, vitaminas, hierbas y suplementos. ?Seguridad en la cirug?a ?Preg?ntele al m?dico: ?C?mo se marcar? Immunologist de la cirug?a. ?Qu? medidas se tomar?n para evitar una infecci?n. Estas medidas pueden incluir las siguientes: ?Rasurar el vello del lugar de la cirug?a. ?Lavar la piel con un jab?n antis?ptico. ?Recibir antibi?ticos. ?Indicaciones generales ?No consuma ning?n producto que contenga nicotina ni tabaco durante al [employer] las 4 semanas anteriores al procedimiento. Estos productos incluyen cigarrillos, tabaco para Theatre manager y aparatos de vapeo, como los cigarrillos electr?nicos. Si necesita ayuda para dejar de consumir estos productos, consulte al m?dico. ?Haga que alguien la lleve a su casa desde el hospital. ?Si va a marcharse a su casa inmediatamente despu?s del procedimiento, p?dale a un adulto responsable que la cuide durante el tiempo que le indiquen. Esto es importante. ??Qu? ocurre durante el procedimiento? ? ?  ? ?Le colocar?n una v?a intravenosa (i.v.) en una vena. ?Le administrar?n uno o m?s de los siguientes medicamentos: ?Un medicamento para ayudar a relajarse (sedante). ?Un medicamento para adormecer la zona (anestesia local). ?Un medicamento que la har?  dormir (anestesia general). ?Un medicamento que se inyecta  en una zona del cuerpo para adormecer toda la regi?n que se encuentra por debajo del sitio de la inyecci?n (anestesia regional). ?Es posible que le vac?en la vejiga con una peque?a sonda (cat?ter). ?Si le administran anestesia general, le introducir?n un tubo en la garganta para ayudarla a respirar. ?Le har?n dos incisiones peque?as, una en la parte inferior del abdomen y Neomia Dear cerca del ombligo. ?Le inflar?n el abdomen con un gas. Esto permitir? que el cirujano vea mejor y le dar? m?s espacio para trabajar. ?Le introducir?n en el abdomen un tubo iluminado (laparoscopio) con Neomia Dear c?mara a trav?s de una de las incisiones. A trav?s de la otra incisi?n, le introducir?n instrumentos peque?os. ?Las trompas de [employer] se atar?n o quemar?n (cauterizar?n), o se obstruir?n con un clip, un anillo o una abrazadera. Es posible que se extraiga una parte peque?a en el centro de cada trompa de [place]. ?Se le extraer? el gas del abdomen. ?Las incisiones se cerrar?n con puntos (suturas). ?Se colocar? una venda (vendaje) sobre las incisiones. ?El procedimiento puede variar seg?n el m?dico y el hospital. ??Qu? ocurre despu?s del procedimiento? ?Le controlar?n la presi?n arterial, la frecuencia card?aca, la frecuencia respiratoria y el nivel de ox?geno en la sangre hasta que se vaya del hospital. ?Cherie Ouch?n medicamentos para Engineer, materials, las n?useas y los v?mitos, seg?n sea necesario. ?Despu?s del procedimiento, puede tener secreci?n vaginal. Es posible que deba usar un ap?sito sanitario. ?Si le administraron un sedante durante el procedimiento, puede afectarlo por varias horas. No conduzca ni opere maquinaria hasta que el m?dico le indique que es seguro [place]. ?Resumen ?La ligadura de trompas laparosc?pica es un procedimiento que se realiza para Neurosurgeon. ?No debe realizarse este procedimiento si quiere quedar embarazada alg?n d?a o si no est? segura de  si desea tener m?s hijos. ?El procedimiento se realiza usando un tubo delgado iluminado (laparoscopio) con una c?mara que se introduce en el abdomen. a trav?s de una incisi?n. ?Despu?s del procedimiento, le administrar?n medicamentos para Engineer, materials, las n?useas y los v?mitos, seg?n sea necesario. ?Haga que alguien la lleve a su casa desde el hospital. ?Esta informaci?n no tiene Theme park manager el consejo del m?dico. Aseg?rese de hacerle al m?dico cualquier pregunta que tenga. ?Document Revised: 11/04/2019 Document Reviewed: 11/04/2019 ?Elsevier Patient Education ? 2023 Elsevier Inc. ? ?

## 2021-05-13 LAB — HEMOGLOBIN A1C
Hgb A1c MFr Bld: 5.9 % of total Hgb — ABNORMAL HIGH (ref <5.7)
Mean Plasma Glucose: 123 mg/dL
eAG (mmol/L): 6.8 mmol/L

## 2021-05-13 LAB — CBC
HCT: 42.7 % (ref 35.0–45.0)
Hemoglobin: 14 g/dL (ref 11.7–15.5)
MCH: 27.9 pg (ref 27.0–33.0)
MCHC: 32.8 g/dL (ref 32.0–36.0)
MCV: 85.2 fL (ref 80.0–100.0)
MPV: 9.9 fL (ref 7.5–12.5)
Platelets: 302 10*3/uL (ref 140–400)
RBC: 5.01 10*6/uL (ref 3.80–5.10)
RDW: 13.2 % (ref 11.0–15.0)
WBC: 5.7 10*3/uL (ref 3.8–10.8)

## 2021-05-13 LAB — VITAMIN D 25 HYDROXY (VIT D DEFICIENCY, FRACTURES): Vit D, 25-Hydroxy: 22 ng/mL — ABNORMAL LOW (ref 30–100)

## 2021-05-13 LAB — LIPID PANEL
Cholesterol: 180 mg/dL (ref <200)
HDL: 44 mg/dL — ABNORMAL LOW (ref >=50)
LDL Cholesterol (Calc): 110 mg/dL (calc) — ABNORMAL HIGH
Non-HDL Cholesterol (Calc): 136 mg/dL (calc) — ABNORMAL HIGH (ref <130)
Total CHOL/HDL Ratio: 4.1 (calc) (ref <5.0)
Triglycerides: 138 mg/dL (ref <150)

## 2022-10-23 NOTE — Progress Notes (Signed)
Patient referred to [facility] for prenatal care by [facility] [place] through [facility] on 10/13/22.   Per [facility], patient has EDD 05/10/23.   Maureen Ralphs RN on 10/23/22 at [phone]

## 2022-11-08 ENCOUNTER — Other Ambulatory Visit (HOSPITAL_COMMUNITY)
Admission: RE | Admit: 2022-11-08 | Discharge: 2022-11-08 | Disposition: A | Discharge status: Home / Self Care | Payer: Self-pay | Source: Ambulatory Visit | Attending: Family Medicine | Admitting: Family Medicine

## 2022-11-08 ENCOUNTER — Ambulatory Visit (INDEPENDENT_AMBULATORY_CARE_PROVIDER_SITE_OTHER): Payer: Self-pay

## 2022-11-08 ENCOUNTER — Other Ambulatory Visit: Payer: Self-pay

## 2022-11-08 VITALS — BP 124/70 | HR 55

## 2022-11-08 DIAGNOSIS — O09521 Supervision of elderly multigravida, first trimester: Secondary | ICD-10-CM

## 2022-11-08 DIAGNOSIS — O09523 Supervision of elderly multigravida, third trimester: Secondary | ICD-10-CM | POA: Insufficient documentation

## 2022-11-08 DIAGNOSIS — O0990 Supervision of high risk pregnancy, unspecified, unspecified trimester: Secondary | ICD-10-CM | POA: Insufficient documentation

## 2022-11-08 DIAGNOSIS — Z3A12 12 weeks gestation of pregnancy: Secondary | ICD-10-CM

## 2022-11-08 DIAGNOSIS — O09522 Supervision of elderly multigravida, second trimester: Secondary | ICD-10-CM | POA: Insufficient documentation

## 2022-11-08 NOTE — Progress Notes (Signed)
124/70New OB Intake  I connected with Laura Brady  on 11/08/22 at  9:15 AM EDT by In Person Visit and verified that I am speaking with the correct person using two identifiers. Nurse is located at [facility] and pt is located at [facility].  I discussed the limitations, risks, security and privacy concerns of performing an evaluation and management service by telephone and the availability of in person appointments. I also discussed with the patient that there may be a patient responsible charge related to this service. The patient expressed understanding and agreed to proceed.  I explained I am completing New OB Intake today. We discussed EDD of Not found.. Pt is [id number]. I reviewed her allergies, medications and Medical/Surgical/OB history.    Patient Active Problem List   Diagnosis Date Noted   AMA (advanced maternal age) multigravida 35+, second trimester 11/08/2022   Supervision of high risk pregnancy, antepartum 11/08/2022   History of prediabetes    Supervision of normal pregnancy 09/16/2019   Vitamin D deficiency 04/21/2019   Atypical squamous cells of undetermined significance (ASCUS) on Papanicolaou smear of cervix 03/31/2019   Obesity affecting pregnancy in first trimester 03/25/2019   History of gestational diabetes in prior pregnancy, currently pregnant in first trimester 03/25/2019   History of COVID-19 02/11/2019   Family history of diabetes mellitus (DM) 11/13/2018    Concerns addressed today  Delivery Plans Plans to deliver at [facility]. Discussed the nature of our practice with multiple providers including residents and students. Due to the size of the practice, the delivering provider may not be the same as those providing prenatal care.   Patient is not interested in water birth. Offered upcoming OB visit with CNM to discuss further.  MyChart/Babyscripts MyChart access verified. I explained pt will have some visits in office and some virtually. Babyscripts  instructions given and order placed. Patient verifies receipt of registration text/e-mail. Account successfully created and app downloaded.  Blood Pressure Cuff/Weight Scale Patient is self-pay; explained patient will be given BP cuff at first prenatal appt. Explained after first prenatal appt pt will check weekly and document in Babyscripts. Patient does have weight scale.  Anatomy US Explained first scheduled [place] will be around 19 weeks. Anatomy US scheduled for [facility].  Is patient a CenteringPregnancy candidate?     If accepted,    Is patient a Mom+Baby Combined Care candidate?  Not a candidate   If accepted, confirm patient does not intend to move from the area for at least 12 months, then notify Mom+Baby staff  Interested in [place]? If yes, send referral and doula dot phrase.   Is patient a candidate for Babyscripts Optimization? Yes  First visit review I reviewed new OB appt with patient. Explained pt will be seen by Dr. Vergie Living at first visit. Discussed [name] genetic screening with patient. Done Panorama and Horizon.. Routine prenatal labs  collected today    Last Pap Diagnosis  Date Value Ref Range Status  03/25/2019 (A)  Final   - Atypical squamous cells of undetermined significance (ASC-US)    Henrietta Dine, CMA 11/08/2022  11:07 AM

## 2022-11-08 NOTE — Progress Notes (Signed)
[facility] [place] scheduled for 12/25/22 @ 9am

## 2022-11-09 LAB — GC/CHLAMYDIA PROBE AMP (~~LOC~~) NOT AT ARMC
Chlamydia: NEGATIVE
Comment: NEGATIVE
Comment: NORMAL
Neisseria Gonorrhea: NEGATIVE

## 2022-11-09 LAB — CBC/D/PLT+RPR+RH+ABO+RUBIGG...
Antibody Screen: NEGATIVE
Basophils Absolute: 0 10*3/uL (ref 0.0–0.2)
Basos: 0 %
EOS (ABSOLUTE): 0.2 10*3/uL (ref 0.0–0.4)
Eos: 2 %
HCV Ab: NONREACTIVE
HIV Screen 4th Generation wRfx: NONREACTIVE
Hematocrit: 39.4 % (ref 34.0–46.6)
Hemoglobin: 12.8 g/dL (ref 11.1–15.9)
Hepatitis B Surface Ag: NEGATIVE
Immature Grans (Abs): 0 10*3/uL (ref 0.0–0.1)
Immature Granulocytes: 0 %
Lymphocytes Absolute: 2.6 10*3/uL (ref 0.7–3.1)
Lymphs: 35 %
MCH: 28.4 pg (ref 26.6–33.0)
MCHC: 32.5 g/dL (ref 31.5–35.7)
MCV: 88 fL (ref 79–97)
Monocytes Absolute: 0.4 10*3/uL (ref 0.1–0.9)
Monocytes: 5 %
Neutrophils Absolute: 4.3 10*3/uL (ref 1.4–7.0)
Neutrophils: 58 %
Platelets: 234 10*3/uL (ref 150–450)
RBC: 4.5 x10E6/uL (ref 3.77–5.28)
RDW: 13.5 % (ref 11.7–15.4)
RPR Ser Ql: NONREACTIVE
Rh Factor: POSITIVE
Rubella Antibodies, IGG: 1.15 {index} (ref >0.99)
WBC: 7.4 10*3/uL (ref 3.4–10.8)

## 2022-11-09 LAB — HCV INTERPRETATION

## 2022-11-09 LAB — HEMOGLOBIN A1C
Est. average glucose Bld gHb Est-mCnc: 123 mg/dL
Hgb A1c MFr Bld: 5.9 % — ABNORMAL HIGH (ref 4.8–5.6)

## 2022-11-10 ENCOUNTER — Encounter: Payer: Self-pay | Admitting: Obstetrics and Gynecology

## 2022-11-10 ENCOUNTER — Telehealth: Payer: Self-pay | Admitting: *Deleted

## 2022-11-10 DIAGNOSIS — O99810 Abnormal glucose complicating pregnancy: Secondary | ICD-10-CM | POA: Insufficient documentation

## 2022-11-10 DIAGNOSIS — O24419 Gestational diabetes mellitus in pregnancy, unspecified control: Secondary | ICD-10-CM | POA: Insufficient documentation

## 2022-11-10 DIAGNOSIS — R7303 Prediabetes: Secondary | ICD-10-CM | POA: Insufficient documentation

## 2022-11-10 NOTE — Telephone Encounter (Signed)
I called patient with Interpreter Ladell Pier and left a message we are calling with results and to make an appointment, please call office back. Nancy Fetter

## 2022-11-10 NOTE — Telephone Encounter (Signed)
-----   Message from [place] sent at 11/10/2022  9:49 AM EDT ----- Please let her know and set up an early GTT because her early A1c is elevated. thanks

## 2022-11-13 NOTE — Telephone Encounter (Signed)
Called pt with Spanish interpreter. Results reviewed. GTT added to new OB 11/15/22.

## 2022-11-14 ENCOUNTER — Other Ambulatory Visit: Payer: Self-pay

## 2022-11-14 DIAGNOSIS — R7309 Other abnormal glucose: Secondary | ICD-10-CM

## 2022-11-15 ENCOUNTER — Ambulatory Visit (INDEPENDENT_AMBULATORY_CARE_PROVIDER_SITE_OTHER): Payer: Self-pay | Admitting: Obstetrics and Gynecology

## 2022-11-15 ENCOUNTER — Encounter: Payer: Self-pay | Admitting: Obstetrics and Gynecology

## 2022-11-15 ENCOUNTER — Other Ambulatory Visit: Payer: Self-pay

## 2022-11-15 VITALS — BP 129/74 | HR 60 | Wt 188.0 lb

## 2022-11-15 DIAGNOSIS — Z758 Other problems related to medical facilities and other health care: Secondary | ICD-10-CM | POA: Insufficient documentation

## 2022-11-15 DIAGNOSIS — O09522 Supervision of elderly multigravida, second trimester: Secondary | ICD-10-CM

## 2022-11-15 DIAGNOSIS — R87610 Atypical squamous cells of undetermined significance on cytologic smear of cervix (ASC-US): Secondary | ICD-10-CM

## 2022-11-15 DIAGNOSIS — O09521 Supervision of elderly multigravida, first trimester: Secondary | ICD-10-CM

## 2022-11-15 DIAGNOSIS — O99810 Abnormal glucose complicating pregnancy: Secondary | ICD-10-CM

## 2022-11-15 DIAGNOSIS — R7309 Other abnormal glucose: Secondary | ICD-10-CM

## 2022-11-15 DIAGNOSIS — Z3A13 13 weeks gestation of pregnancy: Secondary | ICD-10-CM

## 2022-11-15 DIAGNOSIS — Z603 Acculturation difficulty: Secondary | ICD-10-CM

## 2022-11-15 MED ORDER — ASPIRIN 81 MG PO TBEC: 81.0000 mg | DELAYED_RELEASE_TABLET | Freq: Every day | ORAL | 1 refills | Status: DC

## 2022-11-15 NOTE — Progress Notes (Signed)
New OB Note  11/15/2022   Clinic: Center for [facility]  Chief Complaint: new OB  Transfer of Care Patient: no  History of Present Illness: Laura Brady is a 37 y.o. [id number] at 13/0 weeks [facility] 5/14 [tentative], based on Patient's last menstrual period was 08/16/2022.).  Preg complicated by has Obesity affecting pregnancy in first trimester; History of gestational diabetes in prior pregnancy, currently pregnant in first trimester; Atypical squamous cells of undetermined significance (ASCUS) on Papanicolaou smear of cervix; Vitamin D deficiency; Supervision of normal pregnancy; AMA (advanced maternal age) multigravida 35+, second trimester; Supervision of high risk pregnancy, antepartum; Abnormal glucose affecting pregnancy; and Language barrier on their problem list.   No current OB complaints or issues.   ROS: A 12-point review of systems was performed and negative, except as stated in the above HPI.  OBGYN History: As per HPI. OB History  Gravida Para Term Preterm AB Living  6 4 4  0 1 4  SAB IAB Ectopic Multiple Live Births  1 0 0 0 4    # Outcome Date GA Lbr Len/2nd Weight Sex Type Anes PTL Lv  6 Current           5 Term 09/17/19 [redacted]w[redacted]d / 00:16 7 lb 14.5 oz (3.586 kg) M Vag-Spont EPI  LIV     Birth Comments: WDL  4 SAB 08/2018 [redacted]w[redacted]d         3 Term 08/24/13 [redacted]w[redacted]d 04:46 / 00:02 7 lb 7 oz (3.374 kg) M Vag-Spont EPI  LIV     Birth Comments: wnl  2 Term 09/24/10 [redacted]w[redacted]d 05:28 / 00:36 8 lb 5.2 oz (3.776 kg) M Vag-Spont EPI  LIV     Birth Comments: WNL  1 Term 02/21/08 [redacted]w[redacted]d  6 lb 2 oz (2.778 kg) F Vag-Spont EPI N LIV     Birth Comments: IOL for postdates     Past Medical History: Past Medical History:  Diagnosis Date   COVID-19 11/2018   Family history of diabetes mellitus (DM) 11/13/2018   History of COVID-19 02/11/2019   History of prediabetes    Medical history non-contributory     Past Surgical History: Past Surgical History:  Procedure  Laterality Date   NO PAST SURGERIES      Family History:  Family History  Problem Relation Age of Onset   Diabetes Mother    Hyperlipidemia Mother    Hypertension Mother    Brain cancer Mother    Uterine cancer Mother    Stroke Father    Diabetes Father    Diabetes Brother    Social History:  Social History   Socioeconomic History   Marital status: Single    Spouse name: Not on file   Number of children: Not on file   Years of education: Not on file   Highest education level: Not on file  Occupational History   Not on file  Tobacco Use   Smoking status: Never   Smokeless tobacco: Never  Vaping Use   Vaping status: Never Used  Substance and Sexual Activity   Alcohol use: Not Currently    Comment: socially   Drug use: No   Sexual activity: Yes    Partners: Male    Birth control/protection: None  Other Topics Concern   Not on file  Social History Narrative   Marital status: dating seriously x 9 years      Children: 3 children (9, 4,1)      Lives: with boyfriend, 3 chi.ldren  Employment: unemployed      Tobacco: none      Alcohol: weekends/once per month      Drugs: none      Exercise:  Some; gym membership.      Sexual activity: no STDs.         Social Determinants of Health   Financial Resource Strain: Not on file  Food Insecurity: No Food Insecurity (08/20/2019)   Hunger Vital Sign    Worried About Running Out of Food in the Last Year: Never true    Ran Out of Food in the Last Year: Never true  Transportation Needs: No Transportation Needs (08/20/2019)   PRAPARE - Administrator, Civil Service (Medical): No    Lack of Transportation (Non-Medical): No  Physical Activity: Not on file  Stress: Not on file  Social Connections: Not on file  Intimate Partner Violence: Not on file    Allergy: No Known Allergies  Current Outpatient Medications: Prenatal vitamin  Physical Exam:   BP 129/74   Pulse 60   Wt 188 lb (85.3 kg)   LMP  08/16/2022   BMI 35.52 kg/m  Body mass index is 35.52 kg/m.   Vag. Bleeding: None. Fundal height: not applicable FHTs: 150s  General appearance: Well nourished, well developed female in no acute distress.  Neck:  Supple, normal appearance, and no thyromegaly  Cardiovascular: S1, S2 normal, no murmur, rub or gallop, regular rate and rhythm Respiratory:  Clear to auscultation bilateral. Normal respiratory effort Abdomen: positive bowel sounds and no masses, hernias; diffusely non tender to palpation, non distended Breasts: no s/s. Neuro/Psych:  Normal mood and affect.  Skin:  Warm and dry.   Ipad u/s with SLIUP, FHR 150s, +fetal movement and subjectively normal AF, appears c/w with LMP GA  Laboratory: New OB labs reviewed  Imaging:  none  Assessment: patient doing well  Plan: 1. Abnormal glucose affecting pregnancy 2h GTT today  2. AMA (advanced maternal age) multigravida 35+, second trimester Panorama and horizon pending Low dose ASA sent in  3. Atypical squamous cells of undetermined significance (ASCUS) on Papanicolaou smear of cervix BCCCP pap PP  4. Language barrier In person interpreter used  5. Pregnancy Offer AFP next visit [facility] anatomy u/s scheduled for 12/16 and d/w pt Pt desires BTL. D/w her re: self pay of approximately $1300 and must be paid before delivery; also d/w her re: separate anesthesia cost. R/B/A of BTL d/w her. Follow up at subsequent visits.   Problem list reviewed and updated.  Follow up in 4 weeks.   >50% of 30 min visit spent on counseling and coordination of care.  No follow-ups on file.  Future Appointments  Date Time Provider Department Center  12/13/2022 10:15 AM  Bing, MD [facility]    Cornelia Copa MD Attending Center for [facility])

## 2022-11-15 NOTE — Patient Instructions (Signed)
[facility] Ultrasound at [facility] Department on 12/16 at 9 AM.

## 2022-11-16 LAB — PANORAMA PRENATAL TEST FULL PANEL:PANORAMA TEST PLUS 5 ADDITIONAL MICRODELETIONS: FETAL FRACTION: 6.6

## 2022-11-16 LAB — GLUCOSE TOLERANCE, 2 HOURS W/ 1HR
Glucose, 1 hour: 131 mg/dL (ref 70–179)
Glucose, 2 hour: 143 mg/dL (ref 70–152)
Glucose, Fasting: 84 mg/dL (ref 70–91)

## 2022-11-18 LAB — HORIZON CUSTOM: REPORT SUMMARY: NEGATIVE

## 2022-12-13 ENCOUNTER — Other Ambulatory Visit: Payer: Self-pay

## 2022-12-13 ENCOUNTER — Ambulatory Visit (INDEPENDENT_AMBULATORY_CARE_PROVIDER_SITE_OTHER): Payer: Self-pay | Admitting: Obstetrics and Gynecology

## 2022-12-13 VITALS — BP 110/54 | HR 56 | Wt 193.5 lb

## 2022-12-13 DIAGNOSIS — Z603 Acculturation difficulty: Secondary | ICD-10-CM

## 2022-12-13 DIAGNOSIS — O0990 Supervision of high risk pregnancy, unspecified, unspecified trimester: Secondary | ICD-10-CM

## 2022-12-13 DIAGNOSIS — Z758 Other problems related to medical facilities and other health care: Secondary | ICD-10-CM

## 2022-12-13 DIAGNOSIS — Z3A17 17 weeks gestation of pregnancy: Secondary | ICD-10-CM

## 2022-12-13 DIAGNOSIS — O09522 Supervision of elderly multigravida, second trimester: Secondary | ICD-10-CM

## 2022-12-13 NOTE — Progress Notes (Signed)
   PRENATAL VISIT NOTE  Subjective:  Laura Brady is a 37 y.o. [id number] at [redacted]w[redacted]d being seen today for ongoing prenatal care.  She is currently monitored for the following issues for this high-risk pregnancy and has Obesity affecting pregnancy in first trimester; History of gestational diabetes in prior pregnancy, currently pregnant in first trimester; Atypical squamous cells of undetermined significance (ASCUS) on Papanicolaou smear of cervix; Vitamin D deficiency; Supervision of normal pregnancy; AMA (advanced maternal age) multigravida 35+, second trimester; Supervision of high risk pregnancy, antepartum; Abnormal glucose affecting pregnancy; and Language barrier on their problem list.  Patient reports no complaints.  Contractions: Not present. Vag. Bleeding: None.  Movement: Absent. Denies leaking of fluid.   The following portions of the patient's history were reviewed and updated as appropriate: allergies, current medications, past family history, past medical history, past social history, past surgical history and problem list.   Objective:   Vitals:   12/13/22 1047  BP: (!) 110/54  Pulse: (!) 56  Weight: 193 lb 8 oz (87.8 kg)    Fetal Status: Fetal Heart Rate (bpm): 144   Movement: Absent     General:  Alert, oriented and cooperative. Patient is in no acute distress.  Skin: Skin is warm and dry. No rash noted.   Cardiovascular: Normal heart rate noted  Respiratory: Normal respiratory effort, no problems with respiration noted  Abdomen: Soft, gravid, appropriate for gestational age.  Pain/Pressure: Present     Pelvic: Cervical exam deferred        Extremities: Normal range of motion.     Mental Status: Normal mood and affect. Normal behavior. Normal judgment and thought content.   Assessment and Plan:  Pregnancy: [id number] at [redacted]w[redacted]d 1. Supervision of high risk pregnancy, antepartum Adopt a mom patient Making BTL payments Patient amenable to afp. 12/16 [facility]  anatomy u/s confirmed with patient. S/p normal early GTT - AFP, Serum, Open Spina Bifida  2. AMA (advanced maternal age) multigravida 35+, second trimester Confirms on low dose asa Low risk panorama  3. Language barrier In person interpreter used.   Preterm labor symptoms and general obstetric precautions including but not limited to vaginal bleeding, contractions, leaking of fluid and fetal movement were reviewed in detail with the patient. Please refer to After Visit Summary for other counseling recommendations.   No follow-ups on file.  Future Appointments  Date Time Provider Department Center  01/12/2023  8:15 AM Anyanwu, Jethro Bastos, MD [facility]    Barry Bing, MD

## 2022-12-15 LAB — AFP, SERUM, OPEN SPINA BIFIDA
AFP MoM: 1.52
AFP Value: 48.8 ng/mL
Gest. Age on Collection Date: 17 wk
Maternal Age At EDD: 35.9 a
OSBR Risk 1 IN: 2591
Test Results:: NEGATIVE
Weight: 194 [lb_av]

## 2023-01-10 NOTE — L&D Delivery Note (Signed)
 Delivery Note Laura Brady is a 38 y.o. [id number] at [redacted]w[redacted]d admitted for labor.   GBS Status:   unknown, culture collected and received loading dose of PCN 3.5 hours prior to birth  Labor course: Initial SVE: 5/70/-3. Augmentation with: nothing. She then progressed to complete.  ROM: 0h 4m with clear fluid  Birth: After a 1 push 2nd stage, she delivered a Live born female  Birth Weight:   APGAR: 7, 9  Newborn Delivery   Birth date/time: 04/26/2023 22:42:00 Delivery type: Vaginal, Spontaneous        Delivered via spontaneous vaginal delivery (Presentation: OA ). Nuchal cord present: No. . Shoulders and body delivered in usual fashion. Infant placed directly on mom's abdomen for bonding/skin-to-skin, baby dried and stimulated. Cord clamped x 2 after 1 minute and cut by FOB.  Cord blood collected. Placenta delivered-Spontaneous with 3 vessels. 20u Pitocin in 500cc LR given as a bolus prior to delivery of placenta.  Fundus firm with massage. Placenta inspected and appears to be intact with a 3 VC.  Sponge and instrument count were correct x2.  Intrapartum complications:  None Anesthesia:  epidural Lacerations:  none Suture Repair:  EBL (mL):250.00    Mom to postpartum.  Baby to Couplet care / Skin to Skin. Placenta to L&D   Plans to Breast and bottlefeed Contraception: vasectomy Circumcision: declines  Note sent to [facility]: [facility] for pp visit.  Delivery Report:  Review the Delivery Report for details.     Signed: Majel Scott, DNP,CNM 04/26/2023, 11:41 PM

## 2023-01-12 ENCOUNTER — Other Ambulatory Visit: Payer: Self-pay

## 2023-01-12 ENCOUNTER — Ambulatory Visit (INDEPENDENT_AMBULATORY_CARE_PROVIDER_SITE_OTHER): Payer: Self-pay | Admitting: Obstetrics & Gynecology

## 2023-01-12 ENCOUNTER — Encounter: Payer: Self-pay | Admitting: Obstetrics & Gynecology

## 2023-01-12 VITALS — BP 119/80 | HR 84 | Wt 200.0 lb

## 2023-01-12 DIAGNOSIS — Z3A21 21 weeks gestation of pregnancy: Secondary | ICD-10-CM

## 2023-01-12 DIAGNOSIS — O0990 Supervision of high risk pregnancy, unspecified, unspecified trimester: Secondary | ICD-10-CM

## 2023-01-12 DIAGNOSIS — O09522 Supervision of elderly multigravida, second trimester: Secondary | ICD-10-CM

## 2023-01-12 DIAGNOSIS — O99210 Obesity complicating pregnancy, unspecified trimester: Secondary | ICD-10-CM

## 2023-01-12 DIAGNOSIS — E669 Obesity, unspecified: Secondary | ICD-10-CM

## 2023-01-12 DIAGNOSIS — O99212 Obesity complicating pregnancy, second trimester: Secondary | ICD-10-CM

## 2023-01-12 NOTE — Progress Notes (Signed)
   PRENATAL VISIT NOTE  Subjective:  Laura Brady is a 38 y.o. [id number] at [redacted]w[redacted]d being seen today for ongoing prenatal care. Patient is Spanish-speaking only, interpreter present for this encounter. She is currently monitored for the following issues for this high-risk pregnancy and has Obesity affecting pregnancy; History of gestational diabetes in prior pregnancy, currently pregnant; Atypical squamous cells of undetermined significance (ASCUS) on Papanicolaou smear of cervix; Vitamin D  deficiency; AMA (advanced maternal age) multigravida 35+, second trimester; Supervision of high risk pregnancy, antepartum; Abnormal glucose affecting pregnancy; and Language barrier on their problem list.  Patient reports no complaints.  Contractions: Irritability. Vag. Bleeding: None.  Movement: Present. Denies leaking of fluid.   The following portions of the patient's history were reviewed and updated as appropriate: allergies, current medications, past family history, past medical history, past social history, past surgical history and problem list.   Objective:   Vitals:   01/12/23 0827  BP: 119/80  Pulse: 84  Weight: 200 lb (90.7 kg)    Fetal Status: Fetal Heart Rate (bpm): 145   Movement: Present     General:  Alert, oriented and cooperative. Patient is in no acute distress.  Skin: Skin is warm and dry. No rash noted.   Cardiovascular: Normal heart rate noted  Respiratory: Normal respiratory effort, no problems with respiration noted  Abdomen: Soft, gravid, appropriate for gestational age.  Pain/Pressure: Absent     Pelvic: Cervical exam deferred        Extremities: Normal range of motion.  Edema: None  Mental Status: Normal mood and affect. Normal behavior. Normal judgment and thought content.   Assessment and Plan:  Pregnancy: [id number] at [redacted]w[redacted]d 1. Obesity affecting pregnancy, antepartum (Primary) TWG 8 lbs, doing well  2. AMA (advanced maternal age) multigravida 35+, second  trimester 3. [redacted] weeks gestation of pregnancy 4. Supervision of high risk pregnancy, antepartum Anatomy scan to be scheduled at [facility] soon. Preterm labor symptoms and general obstetric precautions including but not limited to vaginal bleeding, contractions, leaking of fluid and fetal movement were reviewed in detail with the patient. Please refer to After Visit Summary for other counseling recommendations.   Return in about 4 weeks (around 02/09/2023) for OFFICE OB VISIT (MD or APP).  No future appointments.  Gloris Hugger, MD

## 2023-02-09 ENCOUNTER — Ambulatory Visit (INDEPENDENT_AMBULATORY_CARE_PROVIDER_SITE_OTHER): Payer: Self-pay | Admitting: Obstetrics & Gynecology

## 2023-02-09 ENCOUNTER — Other Ambulatory Visit: Payer: Self-pay

## 2023-02-09 VITALS — BP 112/74 | HR 71 | Wt 209.5 lb

## 2023-02-09 DIAGNOSIS — Z8632 Personal history of gestational diabetes: Secondary | ICD-10-CM

## 2023-02-09 DIAGNOSIS — O0990 Supervision of high risk pregnancy, unspecified, unspecified trimester: Secondary | ICD-10-CM

## 2023-02-09 DIAGNOSIS — Z758 Other problems related to medical facilities and other health care: Secondary | ICD-10-CM

## 2023-02-09 DIAGNOSIS — O0992 Supervision of high risk pregnancy, unspecified, second trimester: Secondary | ICD-10-CM

## 2023-02-09 DIAGNOSIS — O09299 Supervision of pregnancy with other poor reproductive or obstetric history, unspecified trimester: Secondary | ICD-10-CM

## 2023-02-09 DIAGNOSIS — Z3A25 25 weeks gestation of pregnancy: Secondary | ICD-10-CM

## 2023-02-09 DIAGNOSIS — O99212 Obesity complicating pregnancy, second trimester: Secondary | ICD-10-CM

## 2023-02-09 DIAGNOSIS — Z603 Acculturation difficulty: Secondary | ICD-10-CM

## 2023-02-09 DIAGNOSIS — O09292 Supervision of pregnancy with other poor reproductive or obstetric history, second trimester: Secondary | ICD-10-CM

## 2023-02-09 NOTE — Progress Notes (Signed)
   PRENATAL VISIT NOTE  Subjective:  Laura Brady is a 37 y.o. [id number] at [redacted]w[redacted]d being seen today for ongoing prenatal care.  She is currently monitored for the following issues for this high-risk pregnancy and has Obesity affecting pregnancy; History of gestational diabetes in prior pregnancy, currently pregnant; Atypical squamous cells of undetermined significance (ASCUS) on Papanicolaou smear of cervix; Vitamin D deficiency; AMA (advanced maternal age) multigravida 35+, second trimester; Supervision of high risk pregnancy, antepartum; Abnormal glucose affecting pregnancy; and Language barrier on their problem list.  Patient reports no complaints.  Contractions: Not present. Vag. Bleeding: None.  Movement: Present. Denies leaking of fluid.   The following portions of the patient's history were reviewed and updated as appropriate: allergies, current medications, past family history, past medical history, past social history, past surgical history and problem list.   Objective:   Vitals:   02/09/23 0852  BP: 112/74  Pulse: 71  Weight: 209 lb 8 oz (95 kg)    Fetal Status: Fetal Heart Rate (bpm): 140   Movement: Present     General:  Alert, oriented and cooperative. Patient is in no acute distress.  Skin: Skin is warm and dry. No rash noted.   Cardiovascular: Normal heart rate noted  Respiratory: Normal respiratory effort, no problems with respiration noted  Abdomen: Soft, gravid, appropriate for gestational age.  Pain/Pressure: Absent     Pelvic: Cervical exam deferred        Extremities: Normal range of motion.  Edema: None  Mental Status: Normal mood and affect. Normal behavior. Normal judgment and thought content.   Assessment and Plan:  Pregnancy: [id number] at [redacted]w[redacted]d 1. [redacted] weeks gestation of pregnancy (Primary)   2. History of gestational diabetes in prior pregnancy, currently pregnant   3. Supervision of high risk pregnancy, antepartum   4. Language  barrier Spanish interpreter  5. Obesity affecting pregnancy in second trimester, unspecified obesity type   Preterm labor symptoms and general obstetric precautions including but not limited to vaginal bleeding, contractions, leaking of fluid and fetal movement were reviewed in detail with the patient. Please refer to After Visit Summary for other counseling recommendations.   Return in about 3 weeks (around 03/02/2023).  No future appointments.  Scheryl Darter, MD

## 2023-02-22 ENCOUNTER — Encounter: Payer: Self-pay | Admitting: *Deleted

## 2023-02-27 ENCOUNTER — Other Ambulatory Visit: Payer: Self-pay

## 2023-02-27 DIAGNOSIS — O0990 Supervision of high risk pregnancy, unspecified, unspecified trimester: Secondary | ICD-10-CM

## 2023-03-02 ENCOUNTER — Other Ambulatory Visit (INDEPENDENT_AMBULATORY_CARE_PROVIDER_SITE_OTHER): Payer: Self-pay

## 2023-03-02 ENCOUNTER — Other Ambulatory Visit: Payer: Self-pay

## 2023-03-02 ENCOUNTER — Telehealth: Payer: Self-pay | Admitting: Family Medicine

## 2023-03-02 ENCOUNTER — Ambulatory Visit: Payer: Self-pay | Admitting: Family Medicine

## 2023-03-02 VITALS — BP 110/69 | HR 73 | Wt 213.0 lb

## 2023-03-02 DIAGNOSIS — O09523 Supervision of elderly multigravida, third trimester: Secondary | ICD-10-CM

## 2023-03-02 DIAGNOSIS — O09299 Supervision of pregnancy with other poor reproductive or obstetric history, unspecified trimester: Secondary | ICD-10-CM

## 2023-03-02 DIAGNOSIS — Z758 Other problems related to medical facilities and other health care: Secondary | ICD-10-CM

## 2023-03-02 DIAGNOSIS — O0990 Supervision of high risk pregnancy, unspecified, unspecified trimester: Secondary | ICD-10-CM

## 2023-03-02 DIAGNOSIS — Z8632 Personal history of gestational diabetes: Secondary | ICD-10-CM

## 2023-03-02 DIAGNOSIS — Z603 Acculturation difficulty: Secondary | ICD-10-CM

## 2023-03-02 DIAGNOSIS — Z3A28 28 weeks gestation of pregnancy: Secondary | ICD-10-CM

## 2023-03-02 DIAGNOSIS — O09522 Supervision of elderly multigravida, second trimester: Secondary | ICD-10-CM

## 2023-03-02 DIAGNOSIS — O0993 Supervision of high risk pregnancy, unspecified, third trimester: Secondary | ICD-10-CM

## 2023-03-02 DIAGNOSIS — O09293 Supervision of pregnancy with other poor reproductive or obstetric history, third trimester: Secondary | ICD-10-CM

## 2023-03-02 NOTE — Telephone Encounter (Signed)
Patient paid Tubal Ligation in full $1170.00.

## 2023-03-02 NOTE — Progress Notes (Signed)
   PRENATAL VISIT NOTE  Subjective:  Laura Brady is a 38 y.o. [id number] at [redacted]w[redacted]d being seen today for ongoing prenatal care.  She is currently monitored for the following issues for this high-risk pregnancy and has Obesity affecting pregnancy; History of gestational diabetes in prior pregnancy, currently pregnant; Atypical squamous cells of undetermined significance (ASCUS) on Papanicolaou smear of cervix; Vitamin D deficiency; AMA (advanced maternal age) multigravida 35+, second trimester; Supervision of high risk pregnancy, antepartum; Abnormal glucose affecting pregnancy; and Language barrier on their problem list.  Patient reports no complaints.  Contractions: Irritability. Vag. Bleeding: None.  Movement: Present. Denies leaking of fluid.   The following portions of the patient's history were reviewed and updated as appropriate: allergies, current medications, past family history, past medical history, past social history, past surgical history and problem list.   Objective:   Vitals:   03/02/23 0956  BP: 110/69  Pulse: 73  Weight: 213 lb (96.6 kg)    Fetal Status: Fetal Heart Rate (bpm): 135 Fundal Height: 31 cm Movement: Present     General:  Alert, oriented and cooperative. Patient is in no acute distress.  Skin: Skin is warm and dry. No rash noted.   Cardiovascular: Normal heart rate noted  Respiratory: Normal respiratory effort, no problems with respiration noted  Abdomen: Soft, gravid, appropriate for gestational age.  Pain/Pressure: Present     Pelvic: Cervical exam deferred        Extremities: Normal range of motion.  Edema: None  Mental Status: Normal mood and affect. Normal behavior. Normal judgment and thought content.   Assessment and Plan:  Pregnancy: [id number] at [redacted]w[redacted]d 1. [redacted] weeks gestation of pregnancy   2. Supervision of high risk pregnancy, antepartum (Primary) 28 weeks labs today  3. Language barrier Spanish interpreter: Nohelia used  4. AMA  (advanced maternal age) multigravida 35+, second trimester LR NIPT  5. History of gestational diabetes in prior pregnancy, currently pregnant Repeat testing today  Preterm labor symptoms and general obstetric precautions including but not limited to vaginal bleeding, contractions, leaking of fluid and fetal movement were reviewed in detail with the patient. Please refer to After Visit Summary for other counseling recommendations.   Return in 2 weeks (on 03/16/2023).  Future Appointments  Date Time Provider Department Center  03/16/2023  8:55 AM Venora Maples, MD [facility]  03/30/2023 10:15 AM Warden Fillers, MD [facility]  04/13/2023 10:15 AM Milas Hock, MD [facility]    Reva Bores, MD

## 2023-03-03 ENCOUNTER — Encounter: Payer: Self-pay | Admitting: Family Medicine

## 2023-03-03 LAB — CBC
Hematocrit: 32.9 % — ABNORMAL LOW (ref 34.0–46.6)
Hemoglobin: 10.9 g/dL — ABNORMAL LOW (ref 11.1–15.9)
MCH: 29.1 pg (ref 26.6–33.0)
MCHC: 33.1 g/dL (ref 31.5–35.7)
MCV: 88 fL (ref 79–97)
Platelets: 203 10*3/uL (ref 150–450)
RBC: 3.75 x10E6/uL — ABNORMAL LOW (ref 3.77–5.28)
RDW: 13.6 % (ref 11.7–15.4)
WBC: 7.4 10*3/uL (ref 3.4–10.8)

## 2023-03-03 LAB — GLUCOSE TOLERANCE, 2 HOURS W/ 1HR
Glucose, 1 hour: 172 mg/dL (ref 70–179)
Glucose, 2 hour: 156 mg/dL — ABNORMAL HIGH (ref 70–152)
Glucose, Fasting: 89 mg/dL (ref 70–91)

## 2023-03-03 LAB — HIV ANTIBODY (ROUTINE TESTING W REFLEX): HIV Screen 4th Generation wRfx: NONREACTIVE

## 2023-03-03 LAB — RPR: RPR Ser Ql: NONREACTIVE

## 2023-03-07 ENCOUNTER — Telehealth: Payer: Self-pay

## 2023-03-07 DIAGNOSIS — O24419 Gestational diabetes mellitus in pregnancy, unspecified control: Secondary | ICD-10-CM

## 2023-03-07 NOTE — Telephone Encounter (Incomplete Revision)
-----   Message from Reva Bores sent at 03/03/2023  3:24 PM EST ----- Has GDM--send in supplies, arrange diabetes education.

## 2023-03-07 NOTE — Telephone Encounter (Addendum)
-----   Message from Reva Bores sent at 03/03/2023  3:24 PM EST ----- Has GDM--send in supplies, arrange diabetes education.  With [employer], pt advised of GDM and the need for diabetes education.  Pt agreed to 03/13/23 for Diabetes Education.  Pt advised that she will receive testing supplies at that time as she does not have insurance.  Pt verbalized understanding with no further questions.   Leonette Nutting

## 2023-03-13 ENCOUNTER — Ambulatory Visit: Payer: Self-pay | Admitting: Dietician

## 2023-03-13 ENCOUNTER — Encounter: Payer: Self-pay | Attending: Obstetrics and Gynecology | Admitting: Dietician

## 2023-03-13 ENCOUNTER — Other Ambulatory Visit: Payer: Self-pay

## 2023-03-13 DIAGNOSIS — Z713 Dietary counseling and surveillance: Secondary | ICD-10-CM | POA: Insufficient documentation

## 2023-03-13 DIAGNOSIS — O24419 Gestational diabetes mellitus in pregnancy, unspecified control: Secondary | ICD-10-CM

## 2023-03-13 DIAGNOSIS — Z3A29 29 weeks gestation of pregnancy: Secondary | ICD-10-CM

## 2023-03-13 NOTE — Progress Notes (Signed)
 Patient was seen for Gestational Diabetes on 03/13/2023  Start time 1020 and End time 1120  She is here today with her young son.  Estimated due date: 05/23/2023; [redacted]w[redacted]d  Spanish Interpreter from [employer] #[id number]  Clinical: Medications: prenatal vitamin, aspirin Medical History: GDM currently and with other pregnancies Labs: OGTT fasting 89 1 hour 172, 2 hours 156, A1c 5.9% 11/08/2022  Dietary and Lifestyle History: Patient lives her partner and 4 children.  She does not work outside of the home.  Physical Activity: she was walking but advised by her MD to decrease this due to contractions. Stress: high Sleep: poor, she is uncomfortable  24 hr Recall:  First Meal: egg, cheese, avocado on white toast, coffee (see below) Snack: occasional bread Second meal:  sandwich - ham, lettuce, tomato, mayo Snack: banana Third meal: G[place] filled with beans and chorizo  Snack:  none Beverages:  water, coffee with 1 1/2 tsp sugar and creamer, mineral water   NUTRITION INTERVENTION  Nutrition education (E-1) on the following topics:   Initial Follow-up  [x]  []  Definition of Gestational Diabetes [x]  []  Why dietary management is important in controlling blood glucose [x]  []  Effects each nutrient has on blood glucose levels [x]  []  Simple carbohydrates vs complex carbohydrates [x]  []  Fluid intake [x]  []  Creating a balanced meal plan []  []  Carbohydrate counting  [x]  []  When to check blood glucose levels [x]  []  Proper blood glucose monitoring techniques [x]  []  Effect of stress and stress reduction techniques  [x]  []  Exercise effect on blood glucose levels, appropriate exercise during pregnancy []  []  Importance of limiting caffeine and abstaining from alcohol and smoking [x]  []  Medications used for blood sugar control during pregnancy [x]  []  Hypoglycemia and rule of 15 [x]  []  Postpartum self care  Blood glucose monitor given: Prodigy AutoCode Lot # [id number] Exp: 04/20/2024 CBG: 141 mg/dL  2 hours after breakfast Patient is concerned that she will not have a lot of time to check her blood glucose.  Patient instructed to monitor glucose levels: FBS: 60 - <= 95 mg/dL; 2 hour: <= 914 mg/dL  Patient received handouts: Nutrition Diabetes and Pregnancy Carbohydrate Counting List Blood glucose log Snack ideas for diabetes during pregnancy  Patient will be seen for follow-up as needed.

## 2023-03-15 ENCOUNTER — Other Ambulatory Visit: Payer: Self-pay

## 2023-03-15 ENCOUNTER — Encounter: Payer: Self-pay | Admitting: Family Medicine

## 2023-03-15 ENCOUNTER — Ambulatory Visit: Payer: Self-pay | Admitting: Family Medicine

## 2023-03-15 VITALS — BP 120/77 | HR 98 | Wt 212.0 lb

## 2023-03-15 DIAGNOSIS — O0990 Supervision of high risk pregnancy, unspecified, unspecified trimester: Secondary | ICD-10-CM

## 2023-03-15 DIAGNOSIS — O99212 Obesity complicating pregnancy, second trimester: Secondary | ICD-10-CM

## 2023-03-15 DIAGNOSIS — O99213 Obesity complicating pregnancy, third trimester: Secondary | ICD-10-CM

## 2023-03-15 DIAGNOSIS — O24419 Gestational diabetes mellitus in pregnancy, unspecified control: Secondary | ICD-10-CM

## 2023-03-15 DIAGNOSIS — O09522 Supervision of elderly multigravida, second trimester: Secondary | ICD-10-CM

## 2023-03-15 DIAGNOSIS — Z603 Acculturation difficulty: Secondary | ICD-10-CM

## 2023-03-15 DIAGNOSIS — Z758 Other problems related to medical facilities and other health care: Secondary | ICD-10-CM

## 2023-03-15 DIAGNOSIS — O09523 Supervision of elderly multigravida, third trimester: Secondary | ICD-10-CM

## 2023-03-15 DIAGNOSIS — O0993 Supervision of high risk pregnancy, unspecified, third trimester: Secondary | ICD-10-CM

## 2023-03-15 DIAGNOSIS — Z3A30 30 weeks gestation of pregnancy: Secondary | ICD-10-CM

## 2023-03-15 NOTE — Patient Instructions (Signed)
 Opciones de mtodos anticonceptivos Birth Control Options Los mtodos anticonceptivos tambin se denominan anticonceptivos. Los anticonceptivos previenen Firefighter. Hay muchos tipos de anticonceptivos. Trabaje con el mdico para encontrar la opcin ms adecuada para usted. Anticonceptivos que [place] hormonas Estos tipos de anticonceptivos contienen hormonas para Neurosurgeon. Implante anticonceptivo Este es un pequeo tubo que se coloca dentro de la piel del brazo. El tubo Insurance claims handler colocado durante 3 aos como mximo. Inyeccin anticonceptiva Son inyecciones que se aplican cada 3 meses. Pldoras anticonceptivas Esta es una pldora que se toma todos [place]. Debe tomarla a la [employer]. Parche anticonceptivo Este es un parche que se coloca sobre la piel. Se debe cambiar 1 vez por semana durante 3 semanas. Despus de [employer], el parche se debe retirar durante 1 semana. Anillo vaginal  Este es un anillo de plstico blando que se coloca dentro de la vagina. El anillo se deja colocado durante 3 semanas. Luego, se debe retirar durante 1 semana. Despus se coloca un nuevo anillo. Mtodos de barrera  Preservativo masculino Es una cubierta delgada que se coloca sobre el pene antes de [place]. El preservativo se desecha despus de Doctor, hospital. Preservativo femenino Es una cubierta blanda y suelta que se coloca en la vagina antes de [place]. El preservativo se desecha despus de Doctor, hospital. Diafragma El diafragma es una barrera blanda con forma de tazn. Debe estar hecho para adaptarse a su cuerpo. Se coloca en la vagina antes de tener sexo con una sustancia qumica que destruye los espermatozoides llamada espermicida. El Designer, fashion/clothing en la vagina durante 6 a 8 horas despus de tener sexo y debe retirarse en un plazo de 24 horas. El diafragma se debe reemplazar: Cada 1 o 2 aos. Despus de dar a luz. Despus de aumentar ms de 15 libras (6.8  kg). Si se somete a una ciruga en la pelvis. Capuchn cervical Este es un capuchn pequeo y [place] se fija sobre el cuello uterino. El cuello uterino es la parte ms baja del tero. Se coloca en la vagina antes del sexo, junto con un espermicida. El capuchn debe fabricarse para usted. El capuchn se debe dejar colocado durante 6 a 8 horas despus del sexo. Se debe retirar en un plazo de 48 horas. El capuchn cervical debe ser recetado y adaptado a su cuerpo por un mdico. Debe reemplazarse cada 2 aos. Esponja Esta es una esponja pequea que se coloca en la vagina antes de [place]. Se debe dejar colocada durante al menos 6 horas despus de [employer]. Se debe retirar en un plazo de 30 horas y desecharse. Espermicidas Son sustancias qumicas que destruyen o impiden que los espermatozoides ingresen al tero. Se pueden presentar en forma de pldora, crema, gel o espuma que se debe colocar en la vagina. Se deben usar al menos de 10 a 15 minutos antes de [employer]. Dispositivo intrauterino Un dispositivo intrauterino (DIU) es un dispositivo que un mdico coloca dentro del tero. Existen dos tipos: DIU hormonal. Este tipo puede permanecer colocado durante 3 a 5 aos. DIU de cobre. Este tipo Insurance claims handler colocado durante 10 aos. Mtodos anticonceptivos permanentes Ligadura de trompas en la mujer Es una ciruga para obstruir las trompas de [place]. Esterilizacin masculina Es una ciruga, llamada vasectoma, para ligar los conductos que transportan los espermatozoides en los hombres. Este mtodo funciona al cabo de 3 meses. Se deben usar otros mtodos anticonceptivos durante 3 meses. Mtodos de planificacin  natural Esto significa no tener [employer] la pareja femenina podra quedar embarazada. A continuacin se mencionan algunos mtodos anticonceptivos por planificacin natural: Usar un calendario a fin de: Hacer un seguimiento de la duracin de cada ciclo  menstrual. Determinar en [employer] se podra producir Firefighter. Planificar no tener [employer] en que se podra producir Firefighter. Reconocer los signos de la ovulacin y no tener relaciones sexuales durante ese perodo. La pareja femenina puede detectar cundo ser la ovulacin haciendo un seguimiento de su temperatura todos [place]. Tambin puede examinar si hay cambios en la mucosidad que proviene del cuello uterino. Dnde obtener ms informacin Centers for Disease Control and Prevention (Centros para el Control y la Prevencin de Enfermedades): [email or web address]. Luego: Introduzca "birth control" o "anticonceptivos" en el cuadro de bsqueda. Esta informacin no tiene Theme park manager el consejo del mdico. Asegrese de hacerle al mdico cualquier pregunta que tenga. Document Revised: 09/01/2022 Document Reviewed: 09/01/2022 Elsevier Patient Education  2024 [employer].

## 2023-03-15 NOTE — Progress Notes (Signed)
   Subjective:  Laura Brady is a 38 y.o. [id number] at [redacted]w[redacted]d being seen today for ongoing prenatal care.  She is currently monitored for the following issues for this high-risk pregnancy and has Obesity affecting pregnancy; Atypical squamous cells of undetermined significance (ASCUS) on Papanicolaou smear of cervix; Vitamin D deficiency; AMA (advanced maternal age) multigravida 35+, second trimester; Supervision of high risk pregnancy, antepartum; Gestational diabetes mellitus (GDM) affecting pregnancy, antepartum; and Language barrier on their problem list.  Patient reports no complaints.  Contractions: Irritability. Vag. Bleeding: None.  Movement: Present. Denies leaking of fluid.   The following portions of the patient's history were reviewed and updated as appropriate: allergies, current medications, past family history, past medical history, past social history, past surgical history and problem list. Problem list updated.  Objective:   Vitals:   03/15/23 1138  BP: 120/77  Pulse: 98  Weight: 212 lb (96.2 kg)    Fetal Status: Fetal Heart Rate (bpm): 142   Movement: Present     General:  Alert, oriented and cooperative. Patient is in no acute distress.  Skin: Skin is warm and dry. No rash noted.   Cardiovascular: Normal heart rate noted  Respiratory: Normal respiratory effort, no problems with respiration noted  Abdomen: Soft, gravid, appropriate for gestational age. Pain/Pressure: Present     Pelvic: Vag. Bleeding: None     Cervical exam deferred        Extremities: Normal range of motion.  Edema: None  Mental Status: Normal mood and affect. Normal behavior. Normal judgment and thought content.   Urinalysis:      Assessment and Plan:  Pregnancy: [id number] at [redacted]w[redacted]d  1. Supervision of high risk pregnancy, antepartum (Primary) BP and FHR normal Discussed contraception plan is highly unrealistic, and that previously quoted cost of ~$1300 is for self pay in setting of  cesarean. In reality cost is on the order of ~$12-15k with patient assistance Patient was actually well aware of this and is planning on paying that amount Discussed vasectomy and patient became tearful, saying that she has discussed with her husband multiple times over the years and he refuses Also discussed IUD, she has tried both copper and hormonal and does not want to resume those methods Encouraged her to bring husband for discussion again, especially since we are now performing v[facility]omies at [facility] Also discussed that her cost is likely to be lower overall with an interval lap tubal rather than immediately postpartum, she will consider  2. Gestational diabetes mellitus (GDM) affecting pregnancy, antepartum Only recently seen by DM educator Only has two days of sugars, fastings are in low 100's but post prandials are good, will follow up at next visit Reports she has only had to do diet modification in prior pregnancies with GDM Last growth [place] was normal at 21 weeks but needs repeat due to GDM, we will coordinate with [facility]  3. AMA (advanced maternal age) multigravida 35+, second trimester   4. Obesity affecting pregnancy in second trimester, unspecified obesity type   5. Language barrier Spanish   Preterm labor symptoms and general obstetric precautions including but not limited to vaginal bleeding, contractions, leaking of fluid and fetal movement were reviewed in detail with the patient. Please refer to After Visit Summary for other counseling recommendations.  Return in 2 weeks (on 03/29/2023) for [facility], ob visit.   Venora Maples, MD

## 2023-03-16 ENCOUNTER — Encounter: Payer: Self-pay | Admitting: Family Medicine

## 2023-03-27 ENCOUNTER — Ambulatory Visit: Payer: Self-pay | Admitting: Family Medicine

## 2023-03-27 ENCOUNTER — Other Ambulatory Visit: Payer: Self-pay

## 2023-03-27 VITALS — BP 117/72 | HR 76 | Wt 214.0 lb

## 2023-03-27 DIAGNOSIS — O0993 Supervision of high risk pregnancy, unspecified, third trimester: Secondary | ICD-10-CM

## 2023-03-27 DIAGNOSIS — O99213 Obesity complicating pregnancy, third trimester: Secondary | ICD-10-CM

## 2023-03-27 DIAGNOSIS — R87610 Atypical squamous cells of undetermined significance on cytologic smear of cervix (ASC-US): Secondary | ICD-10-CM

## 2023-03-27 DIAGNOSIS — O09522 Supervision of elderly multigravida, second trimester: Secondary | ICD-10-CM

## 2023-03-27 DIAGNOSIS — O0990 Supervision of high risk pregnancy, unspecified, unspecified trimester: Secondary | ICD-10-CM

## 2023-03-27 DIAGNOSIS — O09523 Supervision of elderly multigravida, third trimester: Secondary | ICD-10-CM

## 2023-03-27 DIAGNOSIS — Z758 Other problems related to medical facilities and other health care: Secondary | ICD-10-CM

## 2023-03-27 DIAGNOSIS — Z3A31 31 weeks gestation of pregnancy: Secondary | ICD-10-CM

## 2023-03-27 DIAGNOSIS — Z603 Acculturation difficulty: Secondary | ICD-10-CM

## 2023-03-27 DIAGNOSIS — O24419 Gestational diabetes mellitus in pregnancy, unspecified control: Secondary | ICD-10-CM

## 2023-03-27 MED ORDER — METFORMIN HCL 500 MG PO TABS: 500.0000 mg | ORAL_TABLET | Freq: Two times a day (BID) | ORAL | 5 refills | Status: DC

## 2023-03-27 NOTE — Progress Notes (Signed)
   Subjective:  Laura Brady is a 38 y.o. [id number] at [redacted]w[redacted]d being seen today for ongoing prenatal care.  She is currently monitored for the following issues for this high-risk pregnancy and has Obesity affecting pregnancy; Atypical squamous cells of undetermined significance (ASCUS) on Papanicolaou smear of cervix; Vitamin D deficiency; AMA (advanced maternal age) multigravida 35+, second trimester; Supervision of high risk pregnancy, antepartum; Gestational diabetes mellitus (GDM) affecting pregnancy, antepartum; and Language barrier on their problem list.  Patient reports no complaints.  Contractions: Irritability. Vag. Bleeding: None.  Movement: Present. Denies leaking of fluid.   The following portions of the patient's history were reviewed and updated as appropriate: allergies, current medications, past family history, past medical history, past social history, past surgical history and problem list. Problem list updated.  Objective:   Vitals:   03/27/23 0942  BP: 117/72  Pulse: 76  Weight: 214 lb (97.1 kg)    Fetal Status: Fetal Heart Rate (bpm): 138   Movement: Present     General:  Alert, oriented and cooperative. Patient is in no acute distress.  Skin: Skin is warm and dry. No rash noted.   Cardiovascular: Normal heart rate noted  Respiratory: Normal respiratory effort, no problems with respiration noted  Abdomen: Soft, gravid, appropriate for gestational age. Pain/Pressure: Present     Pelvic: Vag. Bleeding: None     Cervical exam deferred        Extremities: Normal range of motion.  Edema: None  Mental Status: Normal mood and affect. Normal behavior. Normal judgment and thought content.   Urinalysis:        Assessment and Plan:  Pregnancy: [id number] at [redacted]w[redacted]d  1. Supervision of high risk pregnancy, antepartum (Primary) BP and FHR normal Brought husband to visit to discuss vasectomy, had a long conversation with him, will document in his chart. Ultimately he  agreed to proceed, message sent to Dr. Nobie Putnam to coordinate  2. Gestational diabetes mellitus (GDM) affecting pregnancy, antepartum Log reviewed, see above. Overall poor control Discussed starting metformin 500 BID, after discussion she is amenable Needs repeat US, reports she went on 03/19/2023, will call [facility] [place] Will need to start antenatal testing weekly at [facility] starting next week Will need one additional growth scan around 36 weeks  3. AMA (advanced maternal age) multigravida 35+, second trimester On ASA  4. Obesity affecting pregnancy in third trimester, unspecified obesity type   5. Language barrier Spanish  6. Atypical squamous cells of undetermined significance (ASCUS) on Papanicolaou smear of cervix Pap tracking updated as incorrectly listed for 5 year follow up Needs repeat pap PP  Preterm labor symptoms and general obstetric precautions including but not limited to vaginal bleeding, contractions, leaking of fluid and fetal movement were reviewed in detail with the patient. Please refer to After Visit Summary for other counseling recommendations.  Return in 2 weeks (on 04/10/2023) for [facility], ob visit.   Venora Maples, MD

## 2023-03-27 NOTE — Patient Instructions (Signed)
 Opciones de mtodos anticonceptivos Birth Control Options Los mtodos anticonceptivos tambin se denominan anticonceptivos. Los anticonceptivos previenen Firefighter. Hay muchos tipos de anticonceptivos. Trabaje con el mdico para encontrar la opcin ms adecuada para usted. Anticonceptivos que [place] hormonas Estos tipos de anticonceptivos contienen hormonas para Neurosurgeon. Implante anticonceptivo Este es un pequeo tubo que se coloca dentro de la piel del brazo. El tubo Insurance claims handler colocado durante 3 aos como mximo. Inyeccin anticonceptiva Son inyecciones que se aplican cada 3 meses. Pldoras anticonceptivas Esta es una pldora que se toma todos [place]. Debe tomarla a la [employer]. Parche anticonceptivo Este es un parche que se coloca sobre la piel. Se debe cambiar 1 vez por semana durante 3 semanas. Despus de [employer], el parche se debe retirar durante 1 semana. Anillo vaginal  Este es un anillo de plstico blando que se coloca dentro de la vagina. El anillo se deja colocado durante 3 semanas. Luego, se debe retirar durante 1 semana. Despus se coloca un nuevo anillo. Mtodos de barrera  Preservativo masculino Es una cubierta delgada que se coloca sobre el pene antes de [place]. El preservativo se desecha despus de Doctor, hospital. Preservativo femenino Es una cubierta blanda y suelta que se coloca en la vagina antes de [place]. El preservativo se desecha despus de Doctor, hospital. Diafragma El diafragma es una barrera blanda con forma de tazn. Debe estar hecho para adaptarse a su cuerpo. Se coloca en la vagina antes de tener sexo con una sustancia qumica que destruye los espermatozoides llamada espermicida. El Designer, fashion/clothing en la vagina durante 6 a 8 horas despus de tener sexo y debe retirarse en un plazo de 24 horas. El diafragma se debe reemplazar: Cada 1 o 2 aos. Despus de dar a luz. Despus de aumentar ms de 15 libras (6.8  kg). Si se somete a una ciruga en la pelvis. Capuchn cervical Este es un capuchn pequeo y [place] se fija sobre el cuello uterino. El cuello uterino es la parte ms baja del tero. Se coloca en la vagina antes del sexo, junto con un espermicida. El capuchn debe fabricarse para usted. El capuchn se debe dejar colocado durante 6 a 8 horas despus del sexo. Se debe retirar en un plazo de 48 horas. El capuchn cervical debe ser recetado y adaptado a su cuerpo por un mdico. Debe reemplazarse cada 2 aos. Esponja Esta es una esponja pequea que se coloca en la vagina antes de [place]. Se debe dejar colocada durante al menos 6 horas despus de [employer]. Se debe retirar en un plazo de 30 horas y desecharse. Espermicidas Son sustancias qumicas que destruyen o impiden que los espermatozoides ingresen al tero. Se pueden presentar en forma de pldora, crema, gel o espuma que se debe colocar en la vagina. Se deben usar al menos de 10 a 15 minutos antes de [employer]. Dispositivo intrauterino Un dispositivo intrauterino (DIU) es un dispositivo que un mdico coloca dentro del tero. Existen dos tipos: DIU hormonal. Este tipo puede permanecer colocado durante 3 a 5 aos. DIU de cobre. Este tipo Insurance claims handler colocado durante 10 aos. Mtodos anticonceptivos permanentes Ligadura de trompas en la mujer Es una ciruga para obstruir las trompas de [place]. Esterilizacin masculina Es una ciruga, llamada vasectoma, para ligar los conductos que transportan los espermatozoides en los hombres. Este mtodo funciona al cabo de 3 meses. Se deben usar otros mtodos anticonceptivos durante 3 meses. Mtodos de planificacin  natural Esto significa no tener [employer] la pareja femenina podra quedar embarazada. A continuacin se mencionan algunos mtodos anticonceptivos por planificacin natural: Usar un calendario a fin de: Hacer un seguimiento de la duracin de cada ciclo  menstrual. Determinar en [employer] se podra producir Firefighter. Planificar no tener [employer] en que se podra producir Firefighter. Reconocer los signos de la ovulacin y no tener relaciones sexuales durante ese perodo. La pareja femenina puede detectar cundo ser la ovulacin haciendo un seguimiento de su temperatura todos [place]. Tambin puede examinar si hay cambios en la mucosidad que proviene del cuello uterino. Dnde obtener ms informacin Centers for Disease Control and Prevention (Centros para el Control y la Prevencin de Enfermedades): [email or web address]. Luego: Introduzca "birth control" o "anticonceptivos" en el cuadro de bsqueda. Esta informacin no tiene Theme park manager el consejo del mdico. Asegrese de hacerle al mdico cualquier pregunta que tenga. Document Revised: 09/01/2022 Document Reviewed: 09/01/2022 Elsevier Patient Education  2024 [employer].

## 2023-03-30 ENCOUNTER — Encounter: Payer: Self-pay | Admitting: Obstetrics and Gynecology

## 2023-04-02 ENCOUNTER — Other Ambulatory Visit: Payer: Self-pay

## 2023-04-02 DIAGNOSIS — O24415 Gestational diabetes mellitus in pregnancy, controlled by oral hypoglycemic drugs: Secondary | ICD-10-CM

## 2023-04-03 ENCOUNTER — Other Ambulatory Visit: Payer: Self-pay

## 2023-04-03 ENCOUNTER — Ambulatory Visit (INDEPENDENT_AMBULATORY_CARE_PROVIDER_SITE_OTHER): Payer: Self-pay

## 2023-04-03 ENCOUNTER — Ambulatory Visit: Payer: Self-pay

## 2023-04-03 DIAGNOSIS — O24415 Gestational diabetes mellitus in pregnancy, controlled by oral hypoglycemic drugs: Secondary | ICD-10-CM

## 2023-04-03 DIAGNOSIS — O289 Unspecified abnormal findings on antenatal screening of mother: Secondary | ICD-10-CM

## 2023-04-03 DIAGNOSIS — Z3A00 Weeks of gestation of pregnancy not specified: Secondary | ICD-10-CM

## 2023-04-10 ENCOUNTER — Other Ambulatory Visit: Payer: Self-pay

## 2023-04-10 ENCOUNTER — Ambulatory Visit: Payer: Self-pay | Admitting: Dietician

## 2023-04-10 DIAGNOSIS — O24419 Gestational diabetes mellitus in pregnancy, unspecified control: Secondary | ICD-10-CM

## 2023-04-10 DIAGNOSIS — Z3A33 33 weeks gestation of pregnancy: Secondary | ICD-10-CM

## 2023-04-10 NOTE — Progress Notes (Signed)
 Patient was seen for Gestational Diabetes on 04/10/2023.  Start time 1005 and End time 1045 She was last seen by this RD on 03/13/2023 Since last visit, she has started metformin - overall blood glucose much improved on Metformin  Estimated due date: 05/23/2023; [redacted]w[redacted]d  Spanish Interpreter from [employer] [phone]  Clinical: Medications: prenatal vitamin, aspirin, Metformin Medical History: GDM currently and with other pregnancies Labs: OGTT fasting 89 1 hour 172, 2 hours 156, A1c 5.9% 11/08/2022  Dietary and Lifestyle History: Patient lives her partner and 4 children.  She does not work outside of the home.  Physical Activity: she was walking but advised by her MD to decrease this due to contractions. Stress: high Sleep: poor, she is uncomfortable  24 hr Recall:  First Meal: 2 eggs, coffee with cinnamon, 1 tsp sugar, 2% milk, bread, cheese Snack: granola bar but usually nuts Second meal:  [place] sandwich,berries Snack: plain yogurt, granola Third meal: fried chicken, rice, salad Snack:  1/2 banana Beverages:  water, coffee with 1 tsp sugar and 2% milk, mineral water   NUTRITION INTERVENTION  Nutrition education (E-1) on the following topics:   Initial Follow-up  [x]  []  Definition of Gestational Diabetes [x]  []  Why dietary management is important in controlling blood glucose [x]  [x]  Effects each nutrient has on blood glucose levels [x]  []  Simple carbohydrates vs complex carbohydrates [x]  [x]  Fluid intake [x]  [x]  Creating a balanced meal plan []  []  Carbohydrate counting  [x]  []  When to check blood glucose levels [x]  []  Proper blood glucose monitoring techniques [x]  []  Effect of stress and stress reduction techniques  [x]  []  Exercise effect on blood glucose levels, appropriate exercise during pregnancy []  [x]  Importance of limiting caffeine and abstaining from alcohol and smoking [x]  [x]  Medications used for blood sugar control during  pregnancy [x]  []  Hypoglycemia and rule of 15 [x]  [x]  Postpartum self care  Provided additional blood glucose supplies for the Prodigy meter including lancets and strips (one month supply - Lot [id number] C-4 Expiration 04/10/2024 and Lot [id number] B-[id number] Expiration 06/15/2024.  Patient is testing her blood glucose 4 times daily. Fasting:  91-100 for the past ~week  Post meal:  94-125 for the past ~week Improvement noted since metformin  Patient instructed to monitor glucose levels: FBS: 60 - <= 95 mg/dL; 2 hour: <= 621 mg/dL  Patient received handouts: initial visit Nutrition Diabetes and Pregnancy Carbohydrate Counting List Blood glucose log Snack ideas for diabetes during pregnancy  Patient will be seen for follow-up as needed.

## 2023-04-12 NOTE — Progress Notes (Unsigned)
   PRENATAL VISIT NOTE  Subjective:  Laura Brady is a 38 y.o. [id number] at [redacted]w[redacted]d being seen today for ongoing prenatal care.  She is currently monitored for the following issues for this high-risk pregnancy and has Obesity affecting pregnancy; Atypical squamous cells of undetermined significance (ASCUS) on Papanicolaou smear of cervix; Vitamin D deficiency; AMA (advanced maternal age) multigravida 35+, second trimester; Supervision of high risk pregnancy, antepartum; Gestational diabetes mellitus (GDM) affecting pregnancy, antepartum; and Language barrier on their problem list.  Patient reports {sx:[id number]}.   .  .   . Denies leaking of fluid.   The following portions of the patient's history were reviewed and updated as appropriate: allergies, current medications, past family history, past medical history, past social history, past surgical history and problem list.   Objective:  There were no vitals filed for this visit.  Fetal Status:           General:  Alert, oriented and cooperative. Patient is in no acute distress.  Skin: Skin is warm and dry. No rash noted.   Cardiovascular: Normal heart rate noted  Respiratory: Normal respiratory effort, no problems with respiration noted  Abdomen: Soft, gravid, appropriate for gestational age.        Pelvic: Cervical exam deferred        Extremities: Normal range of motion.     Mental Status: Normal mood and affect. Normal behavior. Normal judgment and thought content.   Assessment and Plan:  Pregnancy: [id number] at [redacted]w[redacted]d 1. Gestational diabetes mellitus (GDM) affecting pregnancy, antepartum (Primary) Current regimen: MTF 500 bid CBG review: *** Regimen changes: {Blank multiple:19196::"***","none"} Growth [place]: 3/10 with [place] was 41%ile. Next [place] is on *** Antenatal monitoring: Continue weekly testing Other needs: None. Discussed plan for IOL no later than 39w pending next growth [place].    2. Supervision of high risk pregnancy,  antepartum Cultures next visit.   3. Obesity affecting pregnancy in third trimester, unspecified obesity type  4. Language barrier Interpreter used throughout  5. AMA (advanced maternal age) multigravida 35+, second trimester LR Nips  6. Pregnancy with 34 completed weeks gestation   Preterm labor symptoms and general obstetric precautions including but not limited to vaginal bleeding, contractions, leaking of fluid and fetal movement were reviewed in detail with the patient. Please refer to After Visit Summary for other counseling recommendations.   No follow-ups on file.  Future Appointments  Date Time Provider Department Center  04/13/2023 10:15 AM Milas Hock, MD [facility]  04/17/2023  8:15 AM [facility] US2 [facility]  04/17/2023  8:55 AM [place] Bing, MD [facility]    Milas Hock, MD

## 2023-04-13 ENCOUNTER — Ambulatory Visit (INDEPENDENT_AMBULATORY_CARE_PROVIDER_SITE_OTHER): Payer: Self-pay | Admitting: Obstetrics and Gynecology

## 2023-04-13 ENCOUNTER — Encounter: Payer: Self-pay | Admitting: Obstetrics and Gynecology

## 2023-04-13 ENCOUNTER — Other Ambulatory Visit: Payer: Self-pay

## 2023-04-13 VITALS — BP 111/58 | HR 73 | Wt 214.8 lb

## 2023-04-13 DIAGNOSIS — O99213 Obesity complicating pregnancy, third trimester: Secondary | ICD-10-CM

## 2023-04-13 DIAGNOSIS — O09522 Supervision of elderly multigravida, second trimester: Secondary | ICD-10-CM

## 2023-04-13 DIAGNOSIS — Z758 Other problems related to medical facilities and other health care: Secondary | ICD-10-CM

## 2023-04-13 DIAGNOSIS — Z3A34 34 weeks gestation of pregnancy: Secondary | ICD-10-CM

## 2023-04-13 DIAGNOSIS — Z603 Acculturation difficulty: Secondary | ICD-10-CM

## 2023-04-13 DIAGNOSIS — O0990 Supervision of high risk pregnancy, unspecified, unspecified trimester: Secondary | ICD-10-CM

## 2023-04-13 DIAGNOSIS — O24419 Gestational diabetes mellitus in pregnancy, unspecified control: Secondary | ICD-10-CM

## 2023-04-13 MED ORDER — METFORMIN HCL 500 MG PO TABS: ORAL_TABLET | ORAL | 1 refills | Status: DC

## 2023-04-13 NOTE — Addendum Note (Signed)
 Addended by: Kathee Delton on: 04/13/2023 11:37 AM   Modules accepted: Orders

## 2023-04-16 ENCOUNTER — Other Ambulatory Visit: Payer: Self-pay | Admitting: *Deleted

## 2023-04-16 DIAGNOSIS — O0990 Supervision of high risk pregnancy, unspecified, unspecified trimester: Secondary | ICD-10-CM

## 2023-04-16 DIAGNOSIS — O09522 Supervision of elderly multigravida, second trimester: Secondary | ICD-10-CM

## 2023-04-16 DIAGNOSIS — O24419 Gestational diabetes mellitus in pregnancy, unspecified control: Secondary | ICD-10-CM

## 2023-04-16 DIAGNOSIS — O99210 Obesity complicating pregnancy, unspecified trimester: Secondary | ICD-10-CM

## 2023-04-16 NOTE — Addendum Note (Signed)
 Addended by: Maxwell Marion E on: 04/16/2023 01:50 PM   Modules accepted: Orders

## 2023-04-17 ENCOUNTER — Other Ambulatory Visit: Payer: Self-pay

## 2023-04-17 ENCOUNTER — Ambulatory Visit (INDEPENDENT_AMBULATORY_CARE_PROVIDER_SITE_OTHER): Payer: Self-pay

## 2023-04-17 ENCOUNTER — Encounter: Payer: Self-pay | Admitting: Obstetrics and Gynecology

## 2023-04-17 DIAGNOSIS — O0993 Supervision of high risk pregnancy, unspecified, third trimester: Secondary | ICD-10-CM

## 2023-04-17 DIAGNOSIS — O09522 Supervision of elderly multigravida, second trimester: Secondary | ICD-10-CM

## 2023-04-17 DIAGNOSIS — O99210 Obesity complicating pregnancy, unspecified trimester: Secondary | ICD-10-CM

## 2023-04-17 DIAGNOSIS — O24419 Gestational diabetes mellitus in pregnancy, unspecified control: Secondary | ICD-10-CM

## 2023-04-17 DIAGNOSIS — O09523 Supervision of elderly multigravida, third trimester: Secondary | ICD-10-CM

## 2023-04-17 DIAGNOSIS — Z3A34 34 weeks gestation of pregnancy: Secondary | ICD-10-CM

## 2023-04-17 DIAGNOSIS — O0990 Supervision of high risk pregnancy, unspecified, unspecified trimester: Secondary | ICD-10-CM

## 2023-04-17 DIAGNOSIS — O99213 Obesity complicating pregnancy, third trimester: Secondary | ICD-10-CM

## 2023-04-19 ENCOUNTER — Other Ambulatory Visit: Payer: Self-pay | Admitting: *Deleted

## 2023-04-19 DIAGNOSIS — O24415 Gestational diabetes mellitus in pregnancy, controlled by oral hypoglycemic drugs: Secondary | ICD-10-CM

## 2023-04-23 ENCOUNTER — Ambulatory Visit: Payer: Self-pay

## 2023-04-23 ENCOUNTER — Other Ambulatory Visit: Payer: Self-pay

## 2023-04-23 DIAGNOSIS — Z3A35 35 weeks gestation of pregnancy: Secondary | ICD-10-CM

## 2023-04-23 DIAGNOSIS — O24415 Gestational diabetes mellitus in pregnancy, controlled by oral hypoglycemic drugs: Secondary | ICD-10-CM

## 2023-04-25 ENCOUNTER — Inpatient Hospital Stay (HOSPITAL_COMMUNITY)
Admission: AD | Admit: 2023-04-25 | Discharge: 2023-04-25 | Disposition: A | Discharge status: Home / Self Care | Payer: Self-pay | Attending: Obstetrics & Gynecology | Admitting: Obstetrics & Gynecology

## 2023-04-25 ENCOUNTER — Telehealth: Payer: Self-pay | Admitting: Family Medicine

## 2023-04-25 ENCOUNTER — Encounter (HOSPITAL_COMMUNITY): Payer: Self-pay | Admitting: Obstetrics & Gynecology

## 2023-04-25 ENCOUNTER — Other Ambulatory Visit: Payer: Self-pay

## 2023-04-25 DIAGNOSIS — O479 False labor, unspecified: Secondary | ICD-10-CM

## 2023-04-25 DIAGNOSIS — O4703 False labor before 37 completed weeks of gestation, third trimester: Secondary | ICD-10-CM | POA: Insufficient documentation

## 2023-04-25 DIAGNOSIS — Z3A36 36 weeks gestation of pregnancy: Secondary | ICD-10-CM | POA: Insufficient documentation

## 2023-04-25 NOTE — MAU Provider Note (Signed)
 S: [name] is a 38 y.o. [id number] at [redacted]w[redacted]d  who presents to MAU today for labor evaluation.     Cervical exam by RN:  Dilation: Fingertip Effacement (%): Thick Cervical Position: Posterior Station: -3 Exam by:: Marna Singleton RN  Fetal Monitoring: Baseline: 135 Variability: moderate Accelerations: present Decelerations: absent Contractions: irregular  MDM Discussed patient with RN. NST reviewed.   A: SIUP at [redacted]w[redacted]d  False labor  P: Discharge home Labor precautions and kick counts included in AVS Patient to follow-up with primary OB as scheduled  Patient may return to MAU as needed or when in labor   Maud Sorenson, MD 04/25/2023 4:15 PM

## 2023-04-25 NOTE — MAU Note (Signed)
.  Laura Brady is a 38 y.o. at [redacted]w[redacted]d here in MAU reporting: cramping started 3 days ago with brownish discharge that doesn't require me to wear a pad.  "I called my doctors office and they told me to come here and get checked out"   Onset of complaint: 3 days ago Pain score: 5 Vitals:   04/25/23 1530 04/25/23 1533  BP:    Pulse:    Resp:    Temp:    SpO2: 99% 99%

## 2023-04-25 NOTE — Telephone Encounter (Signed)
 Patient called wanting to speak to a nurse about her cramping over night and then bleeding..she/her/hers is 36w today and there was no nurse available to speak to. She was advised to go to MAU

## 2023-04-26 ENCOUNTER — Encounter (HOSPITAL_COMMUNITY): Payer: Self-pay | Admitting: Obstetrics & Gynecology

## 2023-04-26 ENCOUNTER — Inpatient Hospital Stay (HOSPITAL_COMMUNITY): Payer: MEDICAID | Admitting: Anesthesiology

## 2023-04-26 ENCOUNTER — Inpatient Hospital Stay (HOSPITAL_COMMUNITY)
Admission: AD | Admit: 2023-04-26 | Discharge: 2023-04-28 | DRG: 807 | Disposition: A | Discharge status: Home / Self Care | Payer: MEDICAID | Attending: Obstetrics & Gynecology | Admitting: Obstetrics & Gynecology

## 2023-04-26 ENCOUNTER — Other Ambulatory Visit: Payer: Self-pay

## 2023-04-26 DIAGNOSIS — Z833 Family history of diabetes mellitus: Secondary | ICD-10-CM

## 2023-04-26 DIAGNOSIS — Z3A36 36 weeks gestation of pregnancy: Secondary | ICD-10-CM

## 2023-04-26 DIAGNOSIS — Z7982 Long term (current) use of aspirin: Secondary | ICD-10-CM | POA: Diagnosis not present

## 2023-04-26 DIAGNOSIS — Z560 Unemployment, unspecified: Secondary | ICD-10-CM | POA: Diagnosis not present

## 2023-04-26 DIAGNOSIS — O99214 Obesity complicating childbirth: Secondary | ICD-10-CM | POA: Diagnosis present

## 2023-04-26 DIAGNOSIS — O0990 Supervision of high risk pregnancy, unspecified, unspecified trimester: Secondary | ICD-10-CM

## 2023-04-26 DIAGNOSIS — Z8249 Family history of ischemic heart disease and other diseases of the circulatory system: Secondary | ICD-10-CM

## 2023-04-26 DIAGNOSIS — O09523 Supervision of elderly multigravida, third trimester: Secondary | ICD-10-CM | POA: Diagnosis not present

## 2023-04-26 DIAGNOSIS — Z8616 Personal history of COVID-19: Secondary | ICD-10-CM

## 2023-04-26 DIAGNOSIS — O24424 Gestational diabetes mellitus in childbirth, insulin controlled: Secondary | ICD-10-CM | POA: Diagnosis not present

## 2023-04-26 DIAGNOSIS — O24419 Gestational diabetes mellitus in pregnancy, unspecified control: Secondary | ICD-10-CM | POA: Diagnosis present

## 2023-04-26 DIAGNOSIS — O99210 Obesity complicating pregnancy, unspecified trimester: Secondary | ICD-10-CM | POA: Diagnosis present

## 2023-04-26 DIAGNOSIS — O24425 Gestational diabetes mellitus in childbirth, controlled by oral hypoglycemic drugs: Secondary | ICD-10-CM | POA: Diagnosis present

## 2023-04-26 DIAGNOSIS — R7303 Prediabetes: Secondary | ICD-10-CM | POA: Diagnosis present

## 2023-04-26 LAB — CBC
HCT: 37.5 % (ref 36.0–46.0)
Hemoglobin: 12.3 g/dL (ref 12.0–15.0)
MCH: 27.6 pg (ref 26.0–34.0)
MCHC: 32.8 g/dL (ref 30.0–36.0)
MCV: 84.1 fL (ref 80.0–100.0)
Platelets: 221 10*3/uL (ref 150–400)
RBC: 4.46 MIL/uL (ref 3.87–5.11)
RDW: 14.7 % (ref 11.5–15.5)
WBC: 8.2 10*3/uL (ref 4.0–10.5)
nRBC: 0 % (ref 0.0–0.2)

## 2023-04-26 LAB — WET PREP, GENITAL
Clue Cells Wet Prep HPF POC: NONE SEEN
Sperm: NONE SEEN
Trich, Wet Prep: NONE SEEN
WBC, Wet Prep HPF POC: <10 (ref <10)
Yeast Wet Prep HPF POC: NONE SEEN

## 2023-04-26 LAB — GLUCOSE, CAPILLARY
Glucose-Capillary: 110 mg/dL — ABNORMAL HIGH (ref 70–99)
Glucose-Capillary: 68 mg/dL — ABNORMAL LOW (ref 70–99)

## 2023-04-26 MED ORDER — SODIUM CHLORIDE 0.9 % IV SOLN
2.0000 g | Freq: Once | INTRAVENOUS | Status: DC
  Filled 2023-04-26: qty 2000

## 2023-04-26 MED ORDER — LACTATED RINGERS IV SOLN: 500.0000 mL | INTRAVENOUS | Status: DC | PRN

## 2023-04-26 MED ORDER — OXYCODONE-ACETAMINOPHEN 5-325 MG PO TABS: 1.0000 | ORAL_TABLET | ORAL | Status: DC | PRN

## 2023-04-26 MED ORDER — ONDANSETRON HCL 4 MG/2ML IJ SOLN: 4.0000 mg | Freq: Four times a day (QID) | INTRAMUSCULAR | Status: DC | PRN

## 2023-04-26 MED ORDER — LIDOCAINE HCL (PF) 1 % IJ SOLN
INTRAMUSCULAR | Status: DC | PRN
Start: 2023-04-26 — End: 2023-04-26
  Administered 2023-04-26: 10 mL via EPIDURAL

## 2023-04-26 MED ORDER — PENICILLIN G POT IN DEXTROSE 60000 UNIT/ML IV SOLN: 3.0000 10*6.[IU] | INTRAVENOUS | Status: DC

## 2023-04-26 MED ORDER — OXYCODONE-ACETAMINOPHEN 5-325 MG PO TABS: 2.0000 | ORAL_TABLET | ORAL | Status: DC | PRN

## 2023-04-26 MED ORDER — DIPHENHYDRAMINE HCL 50 MG/ML IJ SOLN: 12.5000 mg | INTRAMUSCULAR | Status: DC | PRN

## 2023-04-26 MED ORDER — HYDROXYZINE HCL 50 MG PO TABS: 50.0000 mg | ORAL_TABLET | Freq: Four times a day (QID) | ORAL | Status: DC | PRN

## 2023-04-26 MED ORDER — OXYTOCIN BOLUS FROM INFUSION
333.0000 mL | Freq: Once | INTRAVENOUS | Status: AC
  Administered 2023-04-26: 333 mL via INTRAVENOUS

## 2023-04-26 MED ORDER — ACETAMINOPHEN 325 MG PO TABS: 650.0000 mg | ORAL_TABLET | ORAL | Status: DC | PRN

## 2023-04-26 MED ORDER — EPHEDRINE 5 MG/ML INJ: 10.0000 mg | INTRAVENOUS | Status: DC | PRN

## 2023-04-26 MED ORDER — PHENYLEPHRINE 80 MCG/ML (10ML) SYRINGE FOR IV PUSH (FOR BLOOD PRESSURE SUPPORT): 80.0000 ug | PREFILLED_SYRINGE | INTRAVENOUS | Status: DC | PRN

## 2023-04-26 MED ORDER — FLEET ENEMA RE ENEM: 1.0000 | ENEMA | Freq: Every day | RECTAL | Status: DC | PRN

## 2023-04-26 MED ORDER — FENTANYL-BUPIVACAINE-NACL 0.5-0.125-0.9 MG/250ML-% EP SOLN
12.0000 mL/h | EPIDURAL | Status: DC | PRN
  Administered 2023-04-26: 12 mL/h via EPIDURAL
  Filled 2023-04-26: qty 250

## 2023-04-26 MED ORDER — PHENYLEPHRINE 80 MCG/ML (10ML) SYRINGE FOR IV PUSH (FOR BLOOD PRESSURE SUPPORT)
80.0000 ug | PREFILLED_SYRINGE | INTRAVENOUS | Status: DC | PRN
  Filled 2023-04-26: qty 10

## 2023-04-26 MED ORDER — SODIUM CHLORIDE 0.9 % IV SOLN
5.0000 10*6.[IU] | Freq: Once | INTRAVENOUS | Status: AC
  Administered 2023-04-26: 5 10*6.[IU] via INTRAVENOUS
  Filled 2023-04-26: qty 5

## 2023-04-26 MED ORDER — LACTATED RINGERS IV SOLN: INTRAVENOUS | Status: DC

## 2023-04-26 MED ORDER — FENTANYL CITRATE (PF) 100 MCG/2ML IJ SOLN: 50.0000 ug | INTRAMUSCULAR | Status: DC | PRN

## 2023-04-26 MED ORDER — EPHEDRINE 5 MG/ML INJ
10.0000 mg | INTRAVENOUS | Status: DC | PRN
  Filled 2023-04-26: qty 5

## 2023-04-26 MED ORDER — SOD CITRATE-CITRIC ACID 500-334 MG/5ML PO SOLN: 30.0000 mL | ORAL | Status: DC | PRN

## 2023-04-26 MED ORDER — LACTATED RINGERS IV SOLN
500.0000 mL | Freq: Once | INTRAVENOUS | Status: AC
  Administered 2023-04-26: 500 mL via INTRAVENOUS

## 2023-04-26 MED ORDER — LIDOCAINE HCL (PF) 1 % IJ SOLN: 30.0000 mL | INTRAMUSCULAR | Status: DC | PRN

## 2023-04-26 MED ORDER — OXYTOCIN-SODIUM CHLORIDE 30-0.9 UT/500ML-% IV SOLN
2.5000 [IU]/h | INTRAVENOUS | Status: DC
  Administered 2023-04-26: 2.5 [IU]/h via INTRAVENOUS
  Filled 2023-04-26: qty 500

## 2023-04-26 MED ORDER — SODIUM CHLORIDE 0.9 % IV SOLN: 1.0000 g | INTRAVENOUS | Status: DC

## 2023-04-26 NOTE — Progress Notes (Signed)
 CBG 68- Patient asymptomatic. Pt given of juice. Will repeat CBG in 1 hour.

## 2023-04-26 NOTE — MAU Note (Signed)
 Laura Brady is a 38 y.o. at [redacted]w[redacted]d here in MAU reporting: having contraction, getting stronger and closer. Too much pressure. Little bit of bloody mucous noted twice. No LOF reports +FM.Aaron Aas    Onset of complaint: started yesterday.  Pain score: 8 Vitals:   04/26/23 1700  BP: 117/62  Pulse: 70  Resp: 17  Temp: 97.9 F (36.6 C)  SpO2: 99%     FHT:128 Lab orders placed from triage:

## 2023-04-26 NOTE — MAU Provider Note (Signed)
Please refer to H & P for admission details.    UGONNA  ANYANWU, MD, FACOG Attending Obstetrician & Gynecologist Faculty Practice, [facility]   

## 2023-04-26 NOTE — Discharge Summary (Signed)
 Postpartum Discharge Summary     Patient Name: Laura Brady DOB: 1985-07-20 MRN: [id number]  Date of admission: 04/26/2023 Delivery date:04/26/2023 Delivering provider: Majel Scott Date of discharge: 04/28/2023  Admitting diagnosis: Indication for care in labor or delivery [O75.9] Intrauterine pregnancy: [redacted]w[redacted]d     Secondary diagnosis:  Principal Problem:   Indication for care in labor or delivery Active Problems:   Obesity affecting pregnancy   AMA (advanced maternal age) multigravida 35+, third trimester   Supervision of high risk pregnancy, antepartum   Gestational diabetes mellitus (GDM) affecting pregnancy, antepartum   Preterm labor third trimester with preterm delivery third trimester   Vaginal delivery  Additional problems: none     Discharge diagnosis: Term Pregnancy Delivered and GDM A2                                              Post partum procedures: NONE Augmentation: none Complications: None  Hospital course: Onset of Labor With Vaginal Delivery      38 y.o. yo [id number] at [redacted]w[redacted]d was admitted in Active Labor on 04/26/2023. Labor course was complicated by nothing  Membrane Rupture Time/Date: 10:41 PM,04/26/2023  Delivery Method:Vaginal, Spontaneous Operative Delivery:N/A Episiotomy: None Lacerations:  None Patient had a postpartum course complicated by nothing .  She is ambulating, tolerating a regular diet, passing flatus, and urinating well. Patient is discharged home in stable condition on 04/28/23.  Newborn Data: Birth date:04/26/2023 Birth time:10:42 PM Gender:Female Living status:Living Apgars:7 ,9  Weight:2600 g  Magnesium Sulfate received: No BMZ received: No Rhophylac:N/A MMR:N/A T-DaP:Given prenatally Flu: N/A RSV Vaccine received: No Transfusion:No  Immunizations received: Immunization History  Administered Date(s) Administered   Moderna Sars-Covid-2 Vaccination 08/02/2019, 08/29/2019   Tdap 09/26/2010,  07/17/2019    Physical exam  Vitals:   04/27/23 0816 04/27/23 1402 04/27/23 1946 04/28/23 0602  BP: (!) 102/48 (!) 105/57 112/70 (!) 106/56  Pulse: 72 70 68 60  Resp: 17 17 18 18   Temp: 98.7 F (37.1 C) 97.7 F (36.5 C) 98.3 F (36.8 C) 98 F (36.7 C)  TempSrc: Oral Oral Oral Oral  SpO2: 98% 98% 98% 99%  Weight:      Height:       General: alert, cooperative, and no distress Lochia: appropriate Uterine Fundus: firm Incision: N/A DVT Evaluation: No evidence of DVT seen on physical exam. Labs: Lab Results  Component Value Date   WBC 8.2 04/26/2023   HGB 12.3 04/26/2023   HCT 37.5 04/26/2023   MCV 84.1 04/26/2023   PLT 221 04/26/2023      Latest Ref Rng & Units 03/25/2019    2:28 PM  CMP  Glucose 65 - 99 mg/dL 74   BUN 6 - 20 mg/dL 8   Creatinine 1.47 - 8.29 mg/dL 5.62   Sodium 130 - 865 mmol/L 139   Potassium 3.5 - 5.2 mmol/L 3.9   Chloride 96 - 106 mmol/L 103   CO2 20 - 29 mmol/L 21   Calcium 8.7 - 10.2 mg/dL 9.5   Total Protein 6.0 - 8.5 g/dL 6.8   Total Bilirubin 0.0 - 1.2 mg/dL 0.2   Alkaline Phos 39 - 117 IU/L 46   AST 0 - 40 IU/L 12   ALT 0 - 32 IU/L 15    Edinburgh Score:    04/28/2023    6:04 AM  Dimple Francis Postnatal Depression  Scale Screening Tool  I have been able to laugh and see the funny side of things. 0  I have looked forward with enjoyment to things. 0  I have blamed myself unnecessarily when things went wrong. 1  I have been anxious or worried for no good reason. 1  I have felt scared or panicky for no good reason. 1  Things have been getting on top of me. 0  I have been so unhappy that I have had difficulty sleeping. 0  I have felt sad or miserable. 0  I have been so unhappy that I have been crying. 0  The thought of harming myself has occurred to me. 0  Edinburgh Postnatal Depression Scale Total 3   Edinburgh Postnatal Depression Scale Total: 3   After visit meds:  Allergies as of 04/28/2023   No Known Allergies      Medication  List     STOP taking these medications    aspirin  EC 81 MG tablet   metFORMIN  500 MG tablet Commonly known as: GLUCOPHAGE        TAKE these medications    ibuprofen  600 MG tablet Commonly known as: ADVIL  Take 1 tablet (600 mg total) by mouth every 6 (six) hours.   prenatal multivitamin Tabs tablet Take 1 tablet by mouth daily at 12 noon.         Discharge home in stable condition Infant Feeding: Bottle and Breast Infant Disposition:rooming in Discharge instruction: per After Visit Summary and Postpartum booklet. Activity: Advance as tolerated. Pelvic rest for 6 weeks.  Diet: routine diet Future Appointments: Future Appointments  Date Time Provider Department Center  04/30/2023  8:55 AM [facility] US2 [facility]  04/30/2023  9:35 AM Raynell Caller, MD [facility]  05/03/2023  8:00 AM [facility] NURSE INTAKE [facility]  05/08/2023  7:00 AM [facility] PROVIDER 1 [facility]  05/08/2023  7:30 AM [facility] US4 [facility]   Follow up Visit:  Follow-up Information     [facility] Follow up in 6 week(s).   Specialty: Obstetrics and Gynecology Why: For postpartum visit and GTT Contact information: [address] [place]  [address] [phone]        [facility] Follow up.   Specialty: Obstetrics and Gynecology Why: As needed in emergencies Contact information: [address] [employer] [place]  [phone]                 Please schedule this patient for a In person postpartum visit in 4 weeks with the following provider: Any provider. Additional Postpartum F/U:2 hour GTT  Low risk pregnancy complicated by: GDM Delivery mode:  Vaginal, Spontaneous Anticipated Birth Control:   vasectomy   04/28/2023 Sandria Mcenroe  Felipe Horton, CNM

## 2023-04-26 NOTE — Anesthesia Procedure Notes (Signed)
 Epidural Patient location during procedure: OB Start time: 04/26/2023 7:40 PM End time: 04/26/2023 7:50 PM  Staffing Anesthesiologist: Grace Laura, MD Performed: anesthesiologist   Preanesthetic Checklist Completed: patient identified, IV checked, risks and benefits discussed, monitors and equipment checked, pre-op evaluation and timeout performed  Epidural Patient position: sitting Prep: DuraPrep and site prepped and draped Patient monitoring: continuous pulse ox, blood pressure, heart rate and cardiac monitor Approach: midline Location: L3-L4 Injection technique: LOR air  Needle:  Needle type: Tuohy  Needle gauge: 17 G Needle length: 9 cm Needle insertion depth: 5 cm Catheter type: closed end flexible Catheter size: 19 Gauge Catheter at skin depth: 10 cm Test dose: negative  Assessment Sensory level: T8 Events: blood not aspirated, no cerebrospinal fluid, injection not painful, no injection resistance, no paresthesia and negative IV test  Additional Notes Patient identified. Risks/Benefits/Options discussed with patient including but not limited to bleeding, infection, nerve damage, paralysis, failed block, incomplete pain control, headache, blood pressure changes, nausea, vomiting, reactions to medication both or allergic, itching and postpartum back pain. Confirmed with bedside nurse the patient's most recent platelet count. Confirmed with patient that they are not currently taking any anticoagulation, have any bleeding history or any family history of bleeding disorders. Patient expressed understanding and wished to proceed. All questions were answered. Sterile technique was used throughout the entire procedure. Please see nursing notes for vital signs. Test dose was given through epidural catheter and negative prior to continuing to dose epidural or start infusion. Warning signs of high block given to the patient including shortness of breath, tingling/numbness in hands,  complete motor block, or any concerning symptoms with instructions to call for help. Patient was given instructions on fall risk and not to get out of bed. All questions and concerns addressed with instructions to call with any issues or inadequate analgesia.  Reason for block:procedure for pain

## 2023-04-26 NOTE — H&P (Signed)
 Patient is Spanish-speaking only, interpreter present for this encounter.   Obstetric History and Physical  Laura Brady is a 38 y.o. [id number] with IUP at [redacted]w[redacted]d presenting for preterm labor. History of A2GDM on Metformin.  Patient states she has been having  regular, every 5-6 minutes contractions that started last night and became more frequent since then, minimal vaginal bleeding, intact membranes, with active fetal movement.  Was evaluated yesterday, noted to have closed cervical exam.   Last ultrasound for growth at [redacted]w[redacted]d: EFW 1672g/41%  Prenatal Course Source of Care: [facility]  with onset of care at 12 weeks Pregnancy complications or risks: Patient Active Problem List   Diagnosis Date Noted   Indication for care in labor or delivery 04/26/2023   Preterm labor third trimester with preterm delivery third trimester 04/26/2023   Language barrier 11/15/2022   Gestational diabetes mellitus (GDM) affecting pregnancy, antepartum 11/10/2022   AMA (advanced maternal age) multigravida 35+, third trimester 11/08/2022   Supervision of high risk pregnancy, antepartum 11/08/2022   Vitamin D deficiency 04/21/2019   Atypical squamous cells of undetermined significance (ASCUS) on Papanicolaou smear of cervix 03/31/2019   Obesity affecting pregnancy 03/25/2019   NURSING  PROVIDER  Office Location [facility] Dating by LMP  [facility] Model Traditional Anatomy U/S Normal, needs f/u due to GDM  Initiated care at  [employer]  Spanish              LAB RESULTS   Support Person Ceasar( FOB) Genetics NIPS: LR panorama AFP: wnl    NT/IT (FT only)     Carrier Screen Horizon: neg 4  Rhogam  A/Positive/-- (10/30 1025) A1C/GTT Early:5.9 early gtt wnl Third trimester: 89/172/156  Flu Vaccine At [facility]- can't remember when    TDaP Vaccine  03/05/23 at HD Blood Type A/Positive/-- (10/30 1025)A pos  Covid Vaccine No Antibody Negative (10/30 1025)neg  RSV Vaccine  Rubella 1.15  (10/30 1025)imm  Feeding Plan breast RPR Non Reactive (10/30 1025)neg  Contraception Vasectomy! For real! HBsAg Negative (10/30 1025)neg  Circumcision No HIV Non Reactive (10/30 1025)neg  Pediatrician  [facility] HCVAb Non Reactive (10/30 1025)neg  Prenatal Classes       Pap Diagnosis  Date Value Ref Range Status  03/25/2019 (A)  Final   - Atypical squamous cells of undetermined significance (ASC-US)    BTL Consent Self Pay GC/CT Initial: neg   36wks:      Past Medical History:  Diagnosis Date   COVID-19 11/2018   Gestational diabetes    History of COVID-19 02/11/2019   History of prediabetes     Past Surgical History:  Procedure Laterality Date   NO PAST SURGERIES      OB History  Gravida Para Term Preterm AB Living  6 4 4  0 1 4  SAB IAB Ectopic Multiple Live Births  1 0 0 0 4    # Outcome Date GA Lbr Len/2nd Weight Sex Type Anes PTL Lv  6 Current           5 Term 09/17/19 [redacted]w[redacted]d / 00:16 3586 g M Vag-Spont EPI  LIV     Birth Comments: WDL  4 SAB 08/2018 [redacted]w[redacted]d         3 Term 08/24/13 [redacted]w[redacted]d 04:46 / 00:02 3374 g M Vag-Spont EPI  LIV     Birth Comments: wnl  2 Term 09/24/10 [redacted]w[redacted]d 05:28 / 00:36  3776 g M Vag-Spont EPI  LIV     Birth Comments: WNL  1 Term 02/21/08 [redacted]w[redacted]d  2778 g F Vag-Spont EPI N LIV     Birth Comments: IOL for postdates    Social History   Socioeconomic History   Marital status: Single    Spouse name: Not on file   Number of children: Not on file   Years of education: Not on file   Highest education level: Not on file  Occupational History   Not on file  Tobacco Use   Smoking status: Never   Smokeless tobacco: Never  Vaping Use   Vaping status: Never Used  Substance and Sexual Activity   Alcohol use: Not Currently    Comment: socially   Drug use: No   Sexual activity: Yes    Partners: Male    Birth control/protection: None  Other Topics Concern   Not on file  Social History Narrative   Marital status: dating seriously x 9 years       Children: 3 children (9, 85,1)      Lives: with boyfriend, 3 chi.ldren      Employment: unemployed      Tobacco: none      Alcohol: weekends/once per month      Drugs: none      Exercise:  Some; Photographer.      Sexual activity: no STDs.         Social Drivers of Corporate investment banker Strain: Not on file  Food Insecurity: No Food Insecurity (04/25/2023)   Hunger Vital Sign    Worried About Running Out of Food in the Last Year: Never true    Ran Out of Food in the Last Year: Never true  Transportation Needs: No Transportation Needs (04/25/2023)   PRAPARE - Administrator, Civil Service (Medical): No    Lack of Transportation (Non-Medical): No  Physical Activity: Not on file  Stress: Not on file  Social Connections: Not on file    Family History  Problem Relation Age of Onset   Diabetes Mother    Hyperlipidemia Mother    Hypertension Mother    Brain cancer Mother    Uterine cancer Mother    Stroke Father    Diabetes Father    Diabetes Brother     Medications Prior to Admission  Medication Sig Dispense Refill Last Dose/Taking   aspirin EC 81 MG tablet Take 1 tablet (81 mg total) by mouth daily. 60 tablet 1 04/26/2023 Noon   metFORMIN (GLUCOPHAGE) 500 MG tablet Take 500 mg in the morning, and take 1000 mg at bedtime 90 tablet 1 04/25/2023 Evening   Prenatal Vit-Fe Fumarate-FA (PRENATAL MULTIVITAMIN) TABS tablet Take 1 tablet by mouth daily at 12 noon.   04/26/2023 Noon    No Known Allergies  Review of Systems: Negative except for what is mentioned in HPI.  Physical Exam: BP 117/62 (BP Location: Right Arm)   Pulse 70   Temp 97.9 F (36.6 C) (Oral)   Resp 17   Ht 5\' 5"  (1.651 m)   Wt 96.9 kg   LMP 08/16/2022   SpO2 99%   BMI 35.56 kg/m  CONSTITUTIONAL: Well-developed, well-nourished female in no acute distress.  HENT:  Normocephalic, atraumatic, External right and left ear normal. Oropharynx is clear and moist EYES: Conjunctivae and EOM  are normal. Pupils are equal, round, and reactive to light. No scleral icterus.  NECK: Normal range of motion, supple, no masses  SKIN: Skin is warm and dry. No rash noted. Not diaphoretic. No erythema. No pallor. NEUROLOGIC: Alert and oriented to person, place, and time. Normal reflexes, muscle tone coordination. No cranial nerve deficit noted. PSYCHIATRIC: Normal mood and affect. Normal behavior. Normal judgment and thought content. CARDIOVASCULAR: Normal heart rate noted, regular rhythm RESPIRATORY: Effort and breath sounds normal, no problems with respiration noted ABDOMEN: Soft, nontender, nondistended, gravid. MUSCULOSKELETAL: Normal range of motion. No edema and no tenderness. 2+ distal pulses.  Cervical Exam:  Dilation: 5 Effacement (%): 70 Station: -3 Presentation: Vertex Exam by:: Dr. Thurmon [place]  FHT:  Baseline rate 120 bpm   Variability moderate  Accelerations present   Decelerations none Contractions: Every 5 mins   Pertinent Labs/Studies:   No results found for this or any previous visit (from the past 24 hours).  Assessment : Laura Brady is a 38 y.o. [id number] at [redacted]w[redacted]d being admitted for preterm labor, has A2GDM  Plan: Preterm labor: Expectant management. Analgesia as needed. FWB: Reassuring fetal heart tracing.  GBS unknown -> Ampicillin ordered A2GDM: Will check CBGs q2h for now.  Delivery plan: Hopeful for vaginal delivery   Lenoard Rad, MD, FACOG Obstetrician & Gynecologist, [facility] for [employer], [facility]

## 2023-04-26 NOTE — Anesthesia Preprocedure Evaluation (Signed)
 Anesthesia Evaluation  Patient identified by MRN, date of birth, ID band Patient awake    Reviewed: Allergy & Precautions, NPO status , Patient's Chart, lab work & pertinent test results  Airway Mallampati: II  TM Distance: >3 FB Neck ROM: Full    Dental no notable dental hx.    Pulmonary neg pulmonary ROS   Pulmonary exam normal breath sounds clear to auscultation       Cardiovascular negative cardio ROS Normal cardiovascular exam Rhythm:Regular Rate:Normal     Neuro/Psych negative neurological ROS  negative psych ROS   GI/Hepatic negative GI ROS, Neg liver ROS,,,  Endo/Other  diabetes, Gestational, Oral Hypoglycemic Agents  Obese BMI 36  Renal/GU negative Renal ROS  negative genitourinary   Musculoskeletal negative musculoskeletal ROS (+)    Abdominal   Peds  Hematology negative hematology ROS (+)   Anesthesia Other Findings [redacted]w[redacted]d admitted for preterm labor  Reproductive/Obstetrics (+) Pregnancy                             Anesthesia Physical Anesthesia Plan  ASA: 3  Anesthesia Plan: Epidural   Post-op Pain Management:    Induction:   PONV Risk Score and Plan: Treatment may vary due to age or medical condition  Airway Management Planned: Natural Airway  Additional Equipment:   Intra-op Plan:   Post-operative Plan:   Informed Consent: I have reviewed the patients History and Physical, chart, labs and discussed the procedure including the risks, benefits and alternatives for the proposed anesthesia with the patient or authorized representative who has indicated his/her understanding and acceptance.       Plan Discussed with: Anesthesiologist  Anesthesia Plan Comments: (Patient identified. Risks, benefits, options discussed with patient including but not limited to bleeding, infection, nerve damage, paralysis, failed block, incomplete pain control, headache, blood  pressure changes, nausea, vomiting, reactions to medication, itching, and post partum back pain. Confirmed with bedside nurse the patient's most recent platelet count. Confirmed with the patient that they are not taking any anticoagulation, have any bleeding history or any family history of bleeding disorders. Patient expressed understanding and wishes to proceed. All questions were answered. )       Anesthesia Quick Evaluation

## 2023-04-26 NOTE — Progress Notes (Signed)
error 

## 2023-04-27 ENCOUNTER — Encounter (HOSPITAL_COMMUNITY): Payer: Self-pay | Admitting: Obstetrics & Gynecology

## 2023-04-27 LAB — GC/CHLAMYDIA PROBE AMP (~~LOC~~) NOT AT ARMC
Chlamydia: NEGATIVE
Comment: NEGATIVE
Comment: NORMAL
Neisseria Gonorrhea: NEGATIVE

## 2023-04-27 LAB — RPR: RPR Ser Ql: NONREACTIVE

## 2023-04-27 MED ORDER — DIPHENHYDRAMINE HCL 25 MG PO CAPS: 25.0000 mg | ORAL_CAPSULE | Freq: Four times a day (QID) | ORAL | Status: DC | PRN

## 2023-04-27 MED ORDER — BISACODYL 10 MG RE SUPP: 10.0000 mg | Freq: Every day | RECTAL | Status: DC | PRN

## 2023-04-27 MED ORDER — IBUPROFEN 600 MG PO TABS
600.0000 mg | ORAL_TABLET | Freq: Four times a day (QID) | ORAL | Status: DC
  Administered 2023-04-27 – 2023-04-28 (×5): 600 mg via ORAL
  Filled 2023-04-27 (×6): qty 1

## 2023-04-27 MED ORDER — METHYLERGONOVINE MALEATE 0.2 MG PO TABS: 0.2000 mg | ORAL_TABLET | ORAL | Status: DC | PRN

## 2023-04-27 MED ORDER — SIMETHICONE 80 MG PO CHEW: 80.0000 mg | CHEWABLE_TABLET | ORAL | Status: DC | PRN

## 2023-04-27 MED ORDER — SENNOSIDES-DOCUSATE SODIUM 8.6-50 MG PO TABS
2.0000 | ORAL_TABLET | ORAL | Status: DC
  Administered 2023-04-28: 2 via ORAL
  Filled 2023-04-27: qty 2

## 2023-04-27 MED ORDER — ACETAMINOPHEN 325 MG PO TABS: 650.0000 mg | ORAL_TABLET | ORAL | Status: DC | PRN

## 2023-04-27 MED ORDER — FERROUS SULFATE 325 (65 FE) MG PO TABS
325.0000 mg | ORAL_TABLET | ORAL | Status: DC
  Administered 2023-04-27: 325 mg via ORAL
  Filled 2023-04-27: qty 1

## 2023-04-27 MED ORDER — TETANUS-DIPHTH-ACELL PERTUSSIS 5-2.5-18.5 LF-MCG/0.5 IM SUSY: 0.5000 mL | PREFILLED_SYRINGE | Freq: Once | INTRAMUSCULAR | Status: DC

## 2023-04-27 MED ORDER — FLEET ENEMA RE ENEM: 1.0000 | ENEMA | Freq: Every day | RECTAL | Status: DC | PRN

## 2023-04-27 MED ORDER — METHYLERGONOVINE MALEATE 0.2 MG/ML IJ SOLN: 0.2000 mg | INTRAMUSCULAR | Status: DC | PRN

## 2023-04-27 MED ORDER — ONDANSETRON HCL 4 MG/2ML IJ SOLN: 4.0000 mg | INTRAMUSCULAR | Status: DC | PRN

## 2023-04-27 MED ORDER — WITCH HAZEL-GLYCERIN EX PADS: 1.0000 | MEDICATED_PAD | CUTANEOUS | Status: DC | PRN

## 2023-04-27 MED ORDER — ONDANSETRON HCL 4 MG PO TABS: 4.0000 mg | ORAL_TABLET | ORAL | Status: DC | PRN

## 2023-04-27 MED ORDER — COCONUT OIL OIL: 1.0000 | TOPICAL_OIL | Status: DC | PRN

## 2023-04-27 MED ORDER — PRENATAL MULTIVITAMIN CH
1.0000 | ORAL_TABLET | Freq: Every day | ORAL | Status: DC
  Administered 2023-04-27 – 2023-04-28 (×2): 1 via ORAL
  Filled 2023-04-27 (×2): qty 1

## 2023-04-27 MED ORDER — DIBUCAINE (PERIANAL) 1 % EX OINT: 1.0000 | TOPICAL_OINTMENT | CUTANEOUS | Status: DC | PRN

## 2023-04-27 MED ORDER — BENZOCAINE-MENTHOL 20-0.5 % EX AERO: 1.0000 | INHALATION_SPRAY | CUTANEOUS | Status: DC | PRN

## 2023-04-27 MED ORDER — MEASLES, MUMPS & RUBELLA VAC IJ SOLR: 0.5000 mL | Freq: Once | INTRAMUSCULAR | Status: DC

## 2023-04-27 MED ORDER — MEDROXYPROGESTERONE ACETATE 150 MG/ML IM SUSP: 150.0000 mg | INTRAMUSCULAR | Status: DC | PRN

## 2023-04-27 NOTE — Anesthesia Postprocedure Evaluation (Signed)
 Anesthesia Post Note  Patient: Laura Brady  Procedure(s) Performed: AN AD HOC LABOR EPIDURAL     Patient location during evaluation: Mother Baby Anesthesia Type: Epidural Level of consciousness: awake, oriented and awake and alert Pain management: pain level controlled Vital Signs Assessment: post-procedure vital signs reviewed and stable Respiratory status: spontaneous breathing, nonlabored ventilation and respiratory function stable Cardiovascular status: stable Postop Assessment: no headache, patient able to bend at knees, adequate PO intake, able to ambulate, no apparent nausea or vomiting and no backache Anesthetic complications: no   No notable events documented.  Last Vitals:  Vitals:   04/27/23 0520 04/27/23 0816  BP: (!) 111/53 (!) 102/48  Pulse: 83 72  Resp: 18 17  Temp: 37.1 C 37.1 C  SpO2: 100% 98%    Last Pain:  Vitals:   04/27/23 0816  TempSrc: Oral  PainSc: 3    Pain Goal:                Epidural/Spinal Function Cutaneous sensation: Normal sensation (04/27/23 0816), Patient able to flex knees: Yes (04/27/23 0816), Patient able to lift hips off bed: Yes (04/27/23 0816), Back pain beyond tenderness at insertion site: No (04/27/23 0816), Progressively worsening motor and/or sensory loss: No (04/27/23 0816), Bowel and/or bladder incontinence post epidural: No (04/27/23 0816)  Takira Sherrin

## 2023-04-27 NOTE — Progress Notes (Signed)
Ordered pt meals by Saydie Gerdts Spanish Medical Interpreter. 

## 2023-04-27 NOTE — Lactation Note (Addendum)
 This note was copied from a baby's chart. Lactation Consultation Note  Patient Name: Laura Brady [id number] Date: 04/27/2023 Age:36 hours Reason for consult: Initial assessment  P5, 36 wks, @ 12 hrs of life. Video interpreterCosimo Diones # [phone]. Mom both breast and bottle by choice. Per mom baby was sleepy with last attempt @ breast, she expressed drops onto the spoon and fed baby off the spoon, then offered formula. Mom had questions concerning small amount of formula consumed by baby. Reassured- discussed belly size is equal to the spoon size- 5-15 ml on day one is a great sized meal. Encouraged breast stimulation is tied directly to milk production. Hand pump provided, DEBP set up offered- mom declines- prefers hand pump. Baby maybe frustrated @ breast after bottle use, hand expression to start is a great idea. Discussed expectations @ breast- Day 1- sleepy/ feed every 3 hrs/ even 10 minutes is okay, Day 2 more awake/ feeding cues/longer feeds, and cluster feeding overnights brings milk in. LC services and milk storage shared. Encouraged mom to call for assist anytime desired.  Maternal Data Has patient been taught Hand Expression?: Yes Does the patient have breastfeeding experience prior to this delivery?: Yes  Feeding Mother's Current Feeding Choice: Breast Milk and Formula  Lactation Tools Discussed/Used Tools: Pump Breast pump type: Manual Pump Education: Milk Storage Reason for Pumping: Breast stimulation/ movement of colostrum  Interventions Interventions: Hand express;Coconut oil;Hand pump;Education;LC Services brochure;CDC milk storage guidelines  Discharge Pump: Manual;Personal (Hand pump provided)  Consult Status Consult Status: Follow-up Date: 04/28/23 Follow-up type: In-patient    Lennie Vasco 04/27/2023, 11:32 AM

## 2023-04-27 NOTE — Lactation Note (Signed)
 This note was copied from a baby's chart. Lactation Consultation Note  Patient Name: Laura Brady [id number] Date: 04/27/2023 Age:36 hours Reason for consult: L&D Initial assessment;Late-preterm 34-36.6wks.  FOB used as interpreter declined Ipad Spanish interpreter.   MOB did reduce pressure softening prior to latching infant at the breast, infant latched MOB on her left breast using the football hold position, infant breastfeed for 10 minutes and afterwards MOB did hand expression and infant was given 4 mls of colostrum by spoon. MOB understands that due infant being LPTI, infant needs be supplemented after each feeding. MOB informed LC she would like to supplement infant with donor breast milk. MOB understands to use DEBP.   LC discussed LPTI feeding policy: 1- MOB will BF infant every 3 hours and limit breast and donor breast milk to 30 minutes or less. 2- MOB will supplement infant with any EBM first and then donor breast milk. 3- MOB will use DEBP every 3 hours for 15 minutes on initial setting.  Maternal Data Has patient been taught Hand Expression?: Yes Does the patient have breastfeeding experience prior to this delivery?: Yes How long did the patient breastfeed?: Per MOB, she BF her other 4 children 3 years each.  Feeding Mother's Current Feeding Choice: Breast Milk and Donor Milk  LATCH Score Latch: Grasps breast easily, tongue down, lips flanged, rhythmical sucking.  Audible Swallowing: A few with stimulation  Type of Nipple: Inverted  Comfort (Breast/Nipple): Soft / non-tender  Hold (Positioning): Assistance needed to correctly position infant at breast and maintain latch.  LATCH Score: 6   Lactation Tools Discussed/Used    Interventions Interventions: Assisted with latch;Skin to skin;Breast compression;Adjust position;Support pillows;Position options;Expressed milk;Hand express;Education  Discharge    Consult Status Consult Status: Follow-up  from L&D Date: 04/27/23 Follow-up type: In-patient    [name] 04/27/2023, 12:03 AM

## 2023-04-28 LAB — CULTURE, BETA STREP (GROUP B ONLY)

## 2023-04-28 MED ORDER — IBUPROFEN 600 MG PO TABS
600.0000 mg | ORAL_TABLET | Freq: Four times a day (QID) | ORAL | 0 refills | Status: DC
Start: 2023-04-28 — End: 2023-08-10

## 2023-04-28 NOTE — Lactation Note (Signed)
 This note was copied from a baby's chart. Lactation Consultation Note  Patient Name: Laura Brady [id number] Date: 04/28/2023 Age:36 hours Reason for consult: Follow-up assessment;Maternal discharge;Late-preterm 34-36.6wks  Spanish interpretation used for this consult: Claudis Cumber [phone]) and Ima Maltos [phone])  P5 MOB consulted at 35 hours postpartum for feeding progression and maternal discharge. Pt reports offering baby formula primarily due to caloric needs per provider. Pt reports additional placement to the breast for breastfeeding practice and having experience with breastfeeding other 4 children at home without challenges. Pt verbalized that baby's gestational age and need for higher calories is the reason she is offering mostly formula. Plans to attend an outpatient appointment with [facility] after consulting with infant's pediatrician. This LC educated on importance of breast stimulation when not placing infant to breast. Provided supplies for home and wished family well.  Maternal Data   Feeding Mother's Current Feeding Choice: Breast Milk and Formula  LATCH Score   Pt reports she is offering the breast to baby after formula feeding due to education from pediatrician stating infant needs a higher caloric intake. LC provided guidance on breastfeeding effectiveness with formula supplementation. Highly recommended attending an OP lactation consultation with pt's [facility]. Lactation Tools Discussed/Used    Interventions Interventions: Breast feeding basics reviewed;Education;CDC Guidelines for Breast Pump Cleaning;CDC milk storage guidelines  Discharge Discharge Education: Outpatient recommendation Pump: Manual  Consult Status Consult Status: Complete Date: 04/28/23    Ardia Kraft 04/28/2023, 10:00 AM

## 2023-04-28 NOTE — Discharge Instructions (Signed)
 Vasectomy  (Up-to-date Beyond the Basics)  VASECTOMY DEFINITION  Sperm are produced in the testicles and then move into the epididymis, which sits on the upper surface of each testicle. Sperm are stored in the epididymis, where they mature and become capable of fertilization. At the time of ejaculation, seminal fluid and sperm move from the epididymis through the vas deferens and are then expelled from the penis. The vas deferens are long, thin tubes that start in the scrotum and run behind the bladder and then return forward to empty into the urethra inside of the prostate gland; the urethra is the tube inside the penis that carries urine and semen. When a vasectomy is performed, each vas deferens is cut and cauterized (sometimes sutured closed) to prevent sperm from leaving the epididymis. This way, no sperm are expelled from the penis at the time of ejaculation. (See "Vasectomy".)  WHO SHOULD CONSIDER A VASECTOMY?  Vasectomy is for men who want ALL of the following from their birth control method: ?Permanent, not meant to be reversed ?No preparation before or during sex ?A high success rate and low risk of complications ?No other biological children Men who are not sure that vasectomy is right for them can consider a number of other contraceptive options.  VASECTOMY SUCCESS RATES   Vasectomy is successful in more than 99 percent of men. A second method of birth control is necessary until testing is done to confirm that there are no sperm in the semen.  The sperm count is checked, usually three months after the procedure, to ensure that no sperm remain in the ejaculate. A man needs to have ejaculated at least 20 times after vasectomy to clear the ducts of sperm before the follow-up sperm count.  A sperm count requires that the man give a semen sample, usually obtained by masturbation. A man who continues to have sperm in the ejaculate requires a second sperm count, usually performed two  months later. If the follow-up check shows sperm that do not move, there is a small chance that a partner may become pregnant. Another method of contraception should be continued until "special clearance" is given by the doctor.  VASECTOMY PROCEDURE  Consultative visit -- Men who are considering a vasectomy usually have a consult visit before the procedure. At this visit, the doctor will explain the procedure and answer any questions. Men frequently have a great deal of anxiety about vasectomy. Much of the anxiety is related to a fear of pain or damage to the penis or scrotum. The consult visit is an excellent opportunity to discuss the procedure, risks, and potential complications. It is preferable for the man to bring his partner to this visit.  The procedure -- Most vasectomies are performed in a physician's office and take about 30 minutes. ?A local anesthetic is injected under the skin (not into the testicle) using a very small needle or a jet injector ("puff of air"). Only the area around the vas deferens (vas or sperm duct) becomes numb. The injection of the local anesthetic will sting briefly. ?Once the area is numb, some men will still feel a pulling and/or cramping sensation during the procedure. The physician finds the vas deferens by feeling through the skin of the scrotum.  ?To expose the vas, a small puncture is made using the no-scalpel technique. A small incision can also be used. A loop of vas is brought to the surface and a small segment is removed (up to about 1 cm). The two  cut surfaces of the vas are then either cauterized (treated with heat), tied off, or clipped. Once the procedure is complete on one vas deferens, the other side is treated. Often, the second vas can be treated through the same initial puncture or incision. ?In the no-scalpel technique, the skin puncture does not require sutures. If an incision is made, the skin edges are closed with stitches. ?Tight underwear or an  athletic supporter (jock strap) is used to hold a bandage in place and apply pressure to the scrotum. After the procedure -- The man may go home a few minutes after the vasectomy is completed but should have someone available to accompany him and assist with tasks (driving, heavy lifting, etc.). ?The most important factor in a smooth recovery is rest. It is best to take it easy for two to three days after the procedure. ?Applying an ice pack intermittently on top of the underwear helps minimize discomfort and swelling. ?Patients should be asked to limit their activities for about five days. Strenuous exercise or lifting should be avoided for a total of seven days. ?Patients should not bathe or swim for 24 to 48 hours after the procedure. ?Sexual intercourse can be resumed after a week, but a backup method of birth control is necessary until testing is done to confirm that sperm are no longer present in the ejaculate (usually at three months after the procedure). ?Men need to ejaculate at least 20 times to clear the ducts of sperm before the three-month check. (See 'Vasectomy success rates' above.)  PAIN CONTROL FOLLOWING VASECTOMY   After the vasectomy, there may be some cramping and discomfort of the scrotum. This can be relieved by ice pack application and with a pain medication such as acetaminophen (Tylenol). Ibuprofen and aspirin should be avoided for at least one week because these medications may increase the risk of bruising or bleeding around the incision. For more severe pain, a stronger pain medication may be prescribed. Most men find that they do not need the stronger medication.  VASECTOMY COMPLICATIONS  Vasectomy is a relatively simple procedure to perform. Complications are unusual, although possible. ?Bleeding - Excessive bleeding occurs in less than 5 percent of men who undergo vasectomy. Most bleeding problems occur within the first 48 hours after the procedure. Bleeding within the  scrotum can lead to a hematoma, which is an expanding mass of blood within the tissues around the vas. This can grow to a large size if not treated promptly. Men can help to prevent bleeding by avoiding aspirin, ibuprofen, and other nonsteroidal anti-inflammatory drugs (NSAIDs) for at least one week prior to surgery, and by following the instructions regarding rest and limited activity after surgery. ?Infection - Infection occurs in up to 4 percent of men who undergo vasectomy. This typically involves the scrotal skin around the incision. Occasionally, the epididymis will become swollen after a vasectomy. This is usually treated with a short course of oral antibiotics. ?Sperm granuloma - A sperm granuloma occurs in 15 to 40 percent of men who undergo vasectomy. A sperm granuloma is a mass that develops over time as a result of the body's immune reaction to sperm leaking from the cut end of the vas. It is typically treated with an anti-inflammatory medication, such as ibuprofen. The mass is not dangerous. There are rare instances, however, in which a sperm granuloma causes significant scrotal discomfort. This may be treated by surgically removing the granuloma. ?Post-vasectomy pain syndrome - This condition is thought to result from  buildup of fluid in the epididymis leading to a chronic dull ache in the testes. There is some controversy as to how commonly this condition occurs. Historically, rates for post-vasectomy pain syndrome have been reported as very low (<1 percent). However, surveys have found that the incidence of "troublesome" post-vasectomy pain is reported by approximately 15 percent of men, with pain severe enough to affect quality of life in 2 percent. However, survey respondents may not have been representative of all men who have had a vasectomy. The preferred therapy for post-vasectomy pain syndrome is nonsteroidal anti-inflammatory medications (NSAIDs), such as ibuprofen (sample brand names:  Advil, Motrin) or naproxen (sample brand name: Aleve), and warm baths. If these measures are not enough to relieve pain, local nerve blocks or steroid injections may be performed by a pain specialist. Cases that do not respond to therapy may require surgery, including possibly a vasectomy reversal.  HEALTH EFFECTS OF VASECTOMY  Sex drive -- Having a vasectomy will not affect testosterone (female hormone) levels, sex drive, or the ability to have an erection. Risk of cancer -- Although there have been some concerns regarding a link between vasectomy and prostate and testicular cancer, several large studies show that there is no increased risk of any cancer following vasectomy [1-5]. Heart disease -- Similar to the situation with cancer, there have been some concerns about a link between vasectomy and heart disease; however, studies have found no such link.  CHILDREN AFTER VASECTOMY   A man should not have a vasectomy unless he is sure that he does not want to be able to biologically father children in the future. However, about 5 percent of men who have a vasectomy eventually decide that they would like to have it reversed. Sperm antibodies develop in 40 percent of men after a vasectomy. These are formed when leaked sperm cells interact with the body's immune system. These antibodies cause no harm to the man but can make sperm cells less effective if the vasectomy is reversed.  VASECTOMY REVERSAL  Vasectomy reversal is called a vasovasostomy. This is a microsurgical technique that reconnects the vas deferens. The success rate of this procedure depends upon the condition of the vas. As more time elapses from the time of the vasectomy, vasovasostomy is less likely to be successful (ie, result in pregnancy) [6,7]. In one study, vasovasostomy resulted in pregnancy about 76 percent of the time when performed three years or less from the time of the vasectomy, but pregnancy resulted in 30 percent when the  vasectomy was performed 15 years or more before reversal [6]. If sperm antibodies have developed, fertility after vasovasostomy is further reduced. In the [place], vasectomy reversal is not covered by [employer]. Sperm banking -- Sperm banking involves storing a collected sample of sperm with a preservative at a very low temperature. Sperm banking can be accomplished prior to vasectomy or collection of sperm from the ducts can be done at the time of vasectomy reversal surgery. The initial cost of storing sperm is about $500, with annual storage costs between $300 and $1000 per year.   BIRTH CONTROL AFTER VASECTOMY   If the three-month check shows no evidence of residual sperm in the ejaculate, a second form of birth control (eg, condoms) is no longer needed to prevent pregnancy. However, vasectomy does not protect against sexually transmitted diseases (STDs) such as HIV. Men who have more than one sexual partner and men whose partner has other partners should consider using condoms to reduce  the risk of STDs.

## 2023-04-28 NOTE — Progress Notes (Signed)
 Post Partum Day 1 s/p SVD Subjective: no complaints, up ad lib, voiding, and tolerating PO  Objective: Blood pressure 112/70, pulse 68, temperature 98.3 F (36.8 C), temperature source Oral, resp. rate 18, height 5\' 5"  (1.651 m), weight 96.9 kg, last menstrual period 08/16/2022, SpO2 98%, unknown if currently breastfeeding.  Physical Exam:  General: alert, cooperative, and no distress Lochia: appropriate Uterine Fundus: firm DVT Evaluation: No evidence of DVT seen on physical exam.  Recent Labs    04/26/23 1751  HGB 12.3  HCT 37.5    Assessment/Plan: Postpartum - Contraception: vasectomy - MOF: breast & formula - Rh status: Rh+ - Rubella status: RI - Dispo: anticipate discharge home PPD2  Neonatal - Doing well at bedside - Circumcision: declines  3. A2GDM - PP 2h GTT   LOS: 2 days   Laura Brady 04/28/2023, 5:27 AM

## 2023-04-30 ENCOUNTER — Other Ambulatory Visit: Payer: Self-pay

## 2023-04-30 ENCOUNTER — Encounter: Payer: Self-pay | Admitting: Obstetrics and Gynecology

## 2023-05-03 ENCOUNTER — Ambulatory Visit: Payer: Self-pay

## 2023-05-08 ENCOUNTER — Telehealth (HOSPITAL_COMMUNITY): Payer: Self-pay | Admitting: *Deleted

## 2023-05-08 ENCOUNTER — Other Ambulatory Visit: Payer: Self-pay

## 2023-05-08 ENCOUNTER — Ambulatory Visit: Payer: Self-pay

## 2023-05-08 NOTE — Telephone Encounter (Signed)
 05/08/2023  Name: Laura Brady MRN: [id number] DOB: 1985/09/02  Reason for Call:  Transition of Care Hospital Discharge Call  Contact Status: Patient Contact Status: Complete  Language assistant needed: Interpreter Mode: Telephonic Interpreter Interpreter Name: [name] Laura [phone]        Follow-Up Questions: Do You Have Any Concerns About Your Health As You Heal From Delivery?: No Do You Have Any Concerns About Your Infants Health?: No  Edinburgh Postnatal Depression Scale:  In the Past 7 Days: I have been able to laugh and see the funny side of things.: As much as I always could I have looked forward with enjoyment to things.: Rather less than I used to I have blamed myself unnecessarily when things went wrong.: No, never I have been anxious or worried for no good reason.: No, not at all I have felt scared or panicky for no good reason.: No, not at all Things have been getting on top of me.: No, I have been coping as well as ever I have been so unhappy that I have had difficulty sleeping.: Not at all I have felt sad or miserable.: No, not at all I have been so unhappy that I have been crying.: No, never The thought of harming myself has occurred to me.: Never Edinburgh Postnatal Depression Scale Total: 1  PHQ2-9 Depression Scale:     Discharge Follow-up: Edinburgh score requires follow up?: No  Post-discharge interventions: Reviewed Newborn Safe Sleep Practices  Pearlie Bougie, RN 05/08/2023 15:15

## 2023-05-23 ENCOUNTER — Other Ambulatory Visit: Payer: Self-pay

## 2023-05-23 DIAGNOSIS — O24419 Gestational diabetes mellitus in pregnancy, unspecified control: Secondary | ICD-10-CM

## 2023-05-29 ENCOUNTER — Encounter: Payer: Self-pay | Admitting: Obstetrics and Gynecology

## 2023-05-29 ENCOUNTER — Other Ambulatory Visit: Payer: Self-pay

## 2023-05-29 ENCOUNTER — Ambulatory Visit: Payer: Self-pay | Admitting: Obstetrics and Gynecology

## 2023-05-29 DIAGNOSIS — Z758 Other problems related to medical facilities and other health care: Secondary | ICD-10-CM

## 2023-05-29 DIAGNOSIS — Z3009 Encounter for other general counseling and advice on contraception: Secondary | ICD-10-CM

## 2023-05-29 DIAGNOSIS — O24419 Gestational diabetes mellitus in pregnancy, unspecified control: Secondary | ICD-10-CM

## 2023-05-29 DIAGNOSIS — Z603 Acculturation difficulty: Secondary | ICD-10-CM

## 2023-05-29 DIAGNOSIS — O24415 Gestational diabetes mellitus in pregnancy, controlled by oral hypoglycemic drugs: Secondary | ICD-10-CM

## 2023-05-29 NOTE — Progress Notes (Deleted)
    Post Partum Visit Note  Laura Brady is a 38 y.o. [phone] female who presents for a postpartum visit. She is 4.5 weeks postpartum following a normal spontaneous vaginal delivery.  I have fully reviewed the prenatal and intrapartum course. The delivery was at 36.1 gestational weeks.  Anesthesia: epidural. Postpartum course has been ***. Baby is doing well***. Baby is feeding by {breastmilk/bottle:69}. Bleeding {vag bleed:12292}. Bowel function is {normal:32111}. Bladder function is {normal:32111}. Patient {is/is not:9024} sexually active. Contraception method is vasectomy. Postpartum depression screening: {gen negative/positive:315881}.   The pregnancy intention screening data noted above was reviewed. Potential methods of contraception were discussed. The patient elected to proceed with No data recorded.    Health Maintenance Due  Topic Date Due   FOOT EXAM  Never done   OPHTHALMOLOGY EXAM  Never done   Diabetic kidney evaluation - Urine ACR  Never done   COVID-19 Vaccine (3 - Moderna risk series) 09/26/2019   Diabetic kidney evaluation - eGFR measurement  03/24/2020   Cervical Cancer Screening (HPV/Pap Cotest)  03/25/2022   HEMOGLOBIN A1C  05/09/2023    {Common ambulatory SmartLinks:19316}  Review of Systems {ros; complete:30496}  Objective:  LMP 08/16/2022    General:  {gen appearance:16600}   Breasts:  {desc; normal/abnormal/not indicated:14647}  Lungs: {lung exam:16931}  Heart:  {heart exam:5510}  Abdomen: {abdomen exam:16834}   Wound {Wound assessment:11097}  GU exam:  {desc; normal/abnormal/not indicated:14647}       Assessment:    There are no diagnoses linked to this encounter.  *** postpartum exam.   Plan:   Essential components of care per ACOG recommendations:  1.  Mood and well being: Patient with {gen negative/positive:315881} depression screening today. Reviewed local resources for support.  - Patient tobacco use? {tobacco use:25506}  -  hx of drug use? {yes/no:25505}    2. Infant care and feeding:  -Patient currently breastmilk feeding? {yes/no:25502}  -Social determinants of health (SDOH) reviewed in EPIC. No concerns***The following needs were identified***  3. Sexuality, contraception and birth spacing - Patient {DOES_DOES AVW:09811} want a pregnancy in the next year.  Desired family size is {NUMBER 1-10:22536} children.  - Reviewed reproductive life planning. Reviewed contraceptive methods based on pt preferences and effectiveness.  Patient desired {Upstream End Methods:24109} today.   - Discussed birth spacing of 18 months  4. Sleep and fatigue -Encouraged family/partner/community support of 4 hrs of uninterrupted sleep to help with mood and fatigue  5. Physical Recovery  - Discussed patients delivery and complications. She describes her labor as {description:25511} - Patient had a {CHL AMB DELIVERY:[phone]}. Patient had a {laceration:25518} laceration. Perineal healing reviewed. Patient expressed understanding - Patient has urinary incontinence? {yes/no:25515} - Patient {ACTION; IS/IS BJY:[id number]} safe to resume physical and sexual activity  6.  Health Maintenance - HM due items addressed {Yes or If no, why not?:20788} - Last pap smear  Diagnosis  Date Value Ref Range Status  03/25/2019 (A)  Final   - Atypical squamous cells of undetermined significance (ASC-US )   Pap smear {done:10129} at today's visit.  -Breast Cancer screening indicated? {indicated:25516}  7. Chronic Disease/Pregnancy Condition follow up: {Follow up:25499}  - PCP follow up  B'Aisha Derral Flick, CMA [facility] [employer]

## 2023-05-29 NOTE — Progress Notes (Signed)
 Post Partum Visit Note  Laura Brady is a 38 y.o. [phone] female who presents for a postpartum visit. She is 4 weeks 5 days postpartum following a normal spontaneous vaginal delivery.  I have fully reviewed the prenatal and intrapartum course. The delivery was at [redacted]w[redacted]d.  Anesthesia: epidural. Postpartum course has been good. Baby is doing well. Baby is feeding by both breast and bottle - Similac Neosure(thinks). Bleeding staining only. Bowel function is normal. Bladder function is normal. Patient is not sexually active. Contraception method is interval IUD and husband on wait list for vasectomy. Postpartum depression screening: negative.   The pregnancy intention screening data noted above was reviewed. Potential methods of contraception were discussed. The patient elected to proceed with No data recorded.    Health Maintenance Due  Topic Date Due   FOOT EXAM  Never done   OPHTHALMOLOGY EXAM  Never done   Diabetic kidney evaluation - Urine ACR  Never done   COVID-19 Vaccine (3 - Moderna risk series) 09/26/2019   Diabetic kidney evaluation - eGFR measurement  03/24/2020   Cervical Cancer Screening (HPV/Pap Cotest)  03/25/2022   HEMOGLOBIN A1C  05/09/2023    The following portions of the patient's history were reviewed and updated as appropriate: allergies, current medications, past family history, past medical history, past social history, past surgical history, and problem list.  Review of Systems Pertinent items are noted in HPI.  Objective:  BP 103/64   Pulse 74   Wt 199 lb 6.4 oz (90.4 kg)   LMP 08/16/2022   Breastfeeding Yes   BMI 33.18 kg/m    General:  alert, cooperative, and no distress   Breasts:  not indicated  Lungs: clear to auscultation bilaterally  Heart:  regular rate and rhythm, S1, S2 normal, no murmur, click, rub or gallop  Abdomen: soft, non-tender; bowel sounds normal; no masses,  no organomegaly   Wound N/a  GU exam:  not indicated        Assessment:    1. Postpartum state (Primary) Doing well   2. Gestational diabetes mellitus (GDM) in third trimester controlled on oral hypoglycemic drug Started testing but unable to complete due to having to take baby to it's own appointment. Offered return on separate date for GTT vs interval A1c, patient would prefer A1c as baby currently has frequent visits out of town to attend do Recommend A1c in ~2 months  3. Encounter for counseling regarding contraception Patient initially planning for tubal sterilization but instead husband will undergo vasectomy. Interested in having IUD placed until husband has had procedure and it has taken full effect. Had been paying towards tubal and would like to apply funds previously paid towards vasectomy   4. Language barrier In persons Spanish interpreter used for encoutner   Routine postpartum exam.   Plan:   Essential components of care per ACOG recommendations:  1.  Mood and well being: Patient with negative depression screening today. Reviewed local resources for support.  - Patient tobacco use? No.   - hx of drug use? No.    2. Infant care and feeding:  -Patient currently breastmilk feeding? Yes. Reviewed importance of draining breast regularly to support lactation. Some difficulties with latch due to prematurity -Social determinants of health (SDOH) reviewed in EPIC. No concerns  3. Sexuality, contraception and birth spacing - Patient does not want a pregnancy in the next year.  Desired family size is 5 children.  - Reviewed reproductive life planning. Reviewed contraceptive methods based  on pt preferences and effectiveness.  Patient desired IUD or IUS and Vasectomy today.   - Discussed birth spacing of 18 months  4. Sleep and fatigue -Encouraged family/partner/community support of 4 hrs of uninterrupted sleep to help with mood and fatigue  5. Physical Recovery  - Discussed patients delivery and complications. She describes her  labor as mixed. - Patient had a Vaginal, no problems at delivery. Patient had no laceration. Perineal healing reviewed. Patient expressed understanding - Patient has urinary incontinence? No. - Patient is safe to resume physical and sexual activity  6.  Health Maintenance - HM due items addressed Yes - Last pap smear  Diagnosis  Date Value Ref Range Status  03/25/2019 (A)  Final   - Atypical squamous cells of undetermined significance (ASC-US )   Pap smear not done at today's visit.  -Breast Cancer screening indicated? No.   7. Chronic Disease/Pregnancy Condition follow up: Gestational Diabetes  - PCP follow up  Fonda Hymen, RN [facility] for [employer], [facility]  Kiki Pelton, MD, FACOG Minimally Invasive Gynecologic Surgery  Obstetrics and Gynecology, [facility] for [facility], [facility] 05/30/2023

## 2023-07-09 ENCOUNTER — Other Ambulatory Visit: Payer: Self-pay

## 2023-07-09 ENCOUNTER — Encounter: Payer: Self-pay | Admitting: Obstetrics & Gynecology

## 2023-07-09 ENCOUNTER — Ambulatory Visit: Payer: Self-pay | Admitting: Obstetrics & Gynecology

## 2023-07-09 VITALS — BP 135/85 | HR 64 | Ht 59.06 in | Wt 205.1 lb

## 2023-07-09 DIAGNOSIS — Z3202 Encounter for pregnancy test, result negative: Secondary | ICD-10-CM

## 2023-07-09 DIAGNOSIS — Z30430 Encounter for insertion of intrauterine contraceptive device: Secondary | ICD-10-CM

## 2023-07-09 LAB — POCT PREGNANCY, URINE: Preg Test, Ur: NEGATIVE

## 2023-07-09 MED ORDER — PARAGARD INTRAUTERINE COPPER IU IUD
1.0000 | INTRAUTERINE_SYSTEM | Freq: Once | INTRAUTERINE | Status: AC
  Administered 2023-07-09: 1 via INTRAUTERINE

## 2023-07-09 NOTE — Progress Notes (Unsigned)
    GYNECOLOGY OFFICE PROCEDURE NOTE  Laura Brady is a 39 y.o. [id number] here for ParaGard IUD insertion. No GYN concerns.  Last pap smear was on 03/2019 and was ASCUS negative HPV.  IUD Insertion Procedure Note Patient identified, informed consent performed, consent signed.   Discussed risks of irregular bleeding, cramping, infection, malpositioning or misplacement of the IUD outside the uterus which may require further procedure such as laparoscopy. Also discussed >99% contraception efficacy, increased risk of ectopic pregnancy with failure of method.   Emphasized that this did not protect against STIs, condoms recommended during all sexual encounters. Time out was performed.  Chaperone present.  Urine pregnancy test negative.  Speculum placed in the vagina.  Cervix visualized.  Cleaned with Betadine x 2.  Grasped anteriorly with a single tooth tenaculum.  Uterus sounded to 8 cm.   IUD placed per manufacturer's recommendations.  Strings trimmed to 3 cm. Tenaculum was removed, good hemostasis noted.  Patient tolerated procedure well.   Patient was given post-procedure instructions.  She was advised to have backup contraception for one week.  Patient was also asked to check IUD strings periodically and follow up in 4 weeks for IUD check.   Eveline Lynwood MATSU, MD Obstetrician & Gynecologist, [facility] for [facility], [facility]  Patient ID: Laura Brady, female   DOB: [birth year], 38 y.o.   MRN: [id number]

## 2023-07-26 ENCOUNTER — Other Ambulatory Visit: Payer: Self-pay

## 2023-07-30 ENCOUNTER — Other Ambulatory Visit: Payer: Self-pay

## 2023-07-30 LAB — HEMOGLOBIN A1C
Est. average glucose Bld gHb Est-mCnc: 128 mg/dL
Hgb A1c MFr Bld: 6.1 % — ABNORMAL HIGH (ref 4.8–5.6)

## 2023-07-31 ENCOUNTER — Ambulatory Visit: Payer: Self-pay | Admitting: Obstetrics and Gynecology

## 2023-08-01 NOTE — Telephone Encounter (Addendum)
-----   Message from [place] sent at 07/31/2023  1:24 PM EDT ----- Notify that A1c is elevated to pre-diabetes level. Recommend lifestyle modifications and follow up with primary care provider  ----- Message ----- From: Interface, [facility] Lab Results In Sent: 07/30/2023  10:40 PM EDT To: Carter Quarry, MD  7/23  1505  Called pt w/interpreter Marny Royal and informed her of test result indicating pre-diabetes. She is recommended to follow up with PCP.  Pt voiced understanding and stated she will schedule an appointment for this issue.

## 2023-08-09 ENCOUNTER — Encounter: Payer: Self-pay | Admitting: Obstetrics and Gynecology

## 2023-08-09 ENCOUNTER — Ambulatory Visit: Payer: Self-pay | Admitting: Obstetrics and Gynecology

## 2023-08-09 ENCOUNTER — Other Ambulatory Visit: Payer: Self-pay

## 2023-08-09 ENCOUNTER — Other Ambulatory Visit (HOSPITAL_COMMUNITY)
Admission: RE | Admit: 2023-08-09 | Discharge: 2023-08-09 | Disposition: A | Discharge status: Home / Self Care | Payer: Self-pay | Source: Ambulatory Visit | Attending: Obstetrics and Gynecology | Admitting: Obstetrics and Gynecology

## 2023-08-09 VITALS — BP 124/76 | HR 61 | Wt 208.0 lb

## 2023-08-09 DIAGNOSIS — N898 Other specified noninflammatory disorders of vagina: Secondary | ICD-10-CM

## 2023-08-09 DIAGNOSIS — Z30431 Encounter for routine checking of intrauterine contraceptive device: Secondary | ICD-10-CM

## 2023-08-10 NOTE — Progress Notes (Signed)
 Obstetrics and Gynecology Visit Return Patient Evaluation  Appointment Date: 08/09/2023  Primary Care Provider: Pcp, No  OBGYN Clinic: [facility] for [facility]  Chief Complaint: routine IUD check  History of Present Illness:  Laura Brady is a 38 y.o. s/p 6/30 Paragard  IUD placement with Dr. Eveline; she had a routine PP visit on 5/20.   Interval History: Since that time, she states that she hasn't had a period yet but had no issues with sexual intercourse.   Review of Systems: as noted in the History of Present Illness.  Patient Active Problem List   Diagnosis Date Noted   Language barrier 11/15/2022   Pre-diabetes 11/10/2022   Vitamin D  deficiency 04/21/2019   Atypical squamous cells of undetermined significance (ASCUS) on Papanicolaou smear of cervix 03/31/2019   Medications:  Onetha Claude had no medications administered during this visit. Current Outpatient Medications  Medication Sig Dispense Refill   PARAGARD  INTRAUTERINE COPPER  IU by Intrauterine route.     Prenatal Vit-Fe Fumarate-FA (PRENATAL MULTIVITAMIN) TABS tablet Take 1 tablet by mouth daily at 12 noon.     No current facility-administered medications for this visit.    Allergies: has no known allergies.  Physical Exam:  BP 124/76   Pulse 61   Wt 208 lb (94.3 kg)   Breastfeeding Yes   BMI 41.93 kg/m  Body mass index is 41.93 kg/m. General appearance: Well nourished, well developed female in no acute distress.  Abdomen: diffusely non tender to palpation, non distended, and no masses, hernias Neuro/Psych:  Normal mood and affect.    Pelvic exam:  Cervical exam performed in the presence of a chaperone EGBUS: wnl Vaginal vault: wnl Cervix:  IUD strings 3cm from os, negative Bimanual: deferred   Assessment: patient doing well  Plan:  1. Vaginal discharge (Primary) Patient amenable to swab screening - Cervicovaginal ancillary only( [place])  2.  IUD check up No issues.   3. Cervical cancer screening CMA got patient's pap result from [facility] that showed pap smear negative late 2024. D/w her will need repeat late next year.   RTC: PRN  Bebe Izell Raddle MD Attending Center for [facility])

## 2023-08-13 LAB — CERVICOVAGINAL ANCILLARY ONLY
Bacterial Vaginitis (gardnerella): NEGATIVE
Candida Glabrata: NEGATIVE
Candida Vaginitis: NEGATIVE
Chlamydia: NEGATIVE
Comment: NEGATIVE
Comment: NEGATIVE
Comment: NEGATIVE
Comment: NEGATIVE
Comment: NEGATIVE
Comment: NORMAL
Neisseria Gonorrhea: NEGATIVE
Trichomonas: NEGATIVE
